# Patient Record
Sex: Male | Born: 1950 | Race: White | Hispanic: No | Marital: Married | Smoking: Former smoker
Health system: Southern US, Community
[De-identification: ages and names within clinical notes are randomized; demographics above are authoritative.]

## PROBLEM LIST (undated history)

## (undated) DIAGNOSIS — R972 Elevated prostate specific antigen [PSA]: Secondary | ICD-10-CM

## (undated) DIAGNOSIS — I447 Left bundle-branch block, unspecified: Secondary | ICD-10-CM

## (undated) DIAGNOSIS — K219 Gastro-esophageal reflux disease without esophagitis: Secondary | ICD-10-CM

## (undated) DIAGNOSIS — R011 Cardiac murmur, unspecified: Secondary | ICD-10-CM

## (undated) DIAGNOSIS — Z9889 Other specified postprocedural states: Secondary | ICD-10-CM

## (undated) DIAGNOSIS — R55 Syncope and collapse: Secondary | ICD-10-CM

## (undated) DIAGNOSIS — I10 Essential (primary) hypertension: Secondary | ICD-10-CM

## (undated) DIAGNOSIS — I428 Other cardiomyopathies: Secondary | ICD-10-CM

## (undated) DIAGNOSIS — R5383 Other fatigue: Secondary | ICD-10-CM

## (undated) DIAGNOSIS — I429 Cardiomyopathy, unspecified: Secondary | ICD-10-CM

## (undated) DIAGNOSIS — I5022 Chronic systolic (congestive) heart failure: Secondary | ICD-10-CM

## (undated) DIAGNOSIS — L570 Actinic keratosis: Secondary | ICD-10-CM

## (undated) DIAGNOSIS — R0602 Shortness of breath: Secondary | ICD-10-CM

## (undated) DIAGNOSIS — Z85828 Personal history of other malignant neoplasm of skin: Secondary | ICD-10-CM

## (undated) DIAGNOSIS — K635 Polyp of colon: Secondary | ICD-10-CM

## (undated) HISTORY — DX: Cardiac murmur, unspecified: R01.1

## (undated) HISTORY — DX: Other fatigue: R53.83

## (undated) HISTORY — DX: Chronic systolic (congestive) heart failure: I50.22

## (undated) HISTORY — DX: Personal history of other malignant neoplasm of skin: Z85.828

## (undated) HISTORY — PX: TONSILLECTOMY AND ADENOIDECTOMY: SUR1326

## (undated) HISTORY — PX: FRACTURE SURGERY: SHX138

## (undated) HISTORY — DX: Polyp of colon: K63.5

## (undated) HISTORY — PX: CARDIAC CATHETERIZATION: SHX172

## (undated) HISTORY — DX: Other cardiomyopathies: I42.8

## (undated) HISTORY — DX: Elevated prostate specific antigen (PSA): R97.20

## (undated) HISTORY — DX: Shortness of breath: R06.02

## (undated) HISTORY — DX: Actinic keratosis: L57.0

## (undated) HISTORY — PX: COLONOSCOPY: SHX174

## (undated) HISTORY — DX: Syncope and collapse: R55

## (undated) HISTORY — DX: Other specified postprocedural states: Z98.890

---

## 1979-12-21 HISTORY — PX: ORIF ELBOW FRACTURE: SHX5031

## 2000-09-15 ENCOUNTER — Encounter: Payer: Self-pay | Admitting: Emergency Medicine

## 2000-09-15 ENCOUNTER — Emergency Department (HOSPITAL_COMMUNITY): Admission: EM | Admit: 2000-09-15 | Discharge: 2000-09-15 | Payer: Self-pay | Admitting: Emergency Medicine

## 2008-05-20 HISTORY — PX: SHOULDER SURGERY: SHX246

## 2008-05-23 ENCOUNTER — Ambulatory Visit (HOSPITAL_BASED_OUTPATIENT_CLINIC_OR_DEPARTMENT_OTHER): Admission: RE | Admit: 2008-05-23 | Discharge: 2008-05-24 | Payer: Self-pay | Admitting: Orthopedic Surgery

## 2008-05-24 ENCOUNTER — Encounter (INDEPENDENT_AMBULATORY_CARE_PROVIDER_SITE_OTHER): Payer: Self-pay | Admitting: Orthopedic Surgery

## 2011-05-04 NOTE — Op Note (Signed)
NAME:  Isaiah Wu, Isaiah Wu               ACCOUNT NO.:  [id number]   MEDICAL RECORD NO.:  [id number]          PATIENT TYPE:  AMB   LOCATION:  DSC                          FACILITY:  [facility]   PHYSICIAN:  Katy Fitch. Sypher, M.D. DATE OF BIRTH:  1951/09/18   DATE OF PROCEDURE:  05/23/2008  DATE OF DISCHARGE:                               OPERATIVE REPORT   PREOPERATIVE DIAGNOSES:  Painful right shoulder with MRI evidence of  acromioclavicular joint arthropathy, a partial-thickness rotator cuff  tear, and a substantial lytic lesion in the greater tuberosity of the  proximal right humerus consistent with a possible cyst or enchondroma.   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   OPERATION:  1. Diagnostic arthroscopy, right shoulder.  2. Arthroscopic debridement of labrum.  3. Arthroscopic subacromial decompression with bursectomy,      coracoacromial ligament relaxation, and acromioplasty.  4. Arthroscopic distal clavicle resection.  5. Excisional biopsy of lytic lesion, right humerus greater      tuberosity, followed by cancellus allograft bone graft.  6. Repair of rotator cuff utilizing 2 medial Bio-Corkscrew anchors and      a McLaughlin through-bone suture with a suture bridge and over-the-      top technique to secure the bone graft at the greater tuberosity.   OPERATING SURGEON:  Katy Fitch. Sypher, MD   ASSISTANT:  Marveen Reeks Dasnoit, PA   ANESTHESIA:  General by endotracheal technique supplemented by a right  interscalene block.   SUPERVISING ANESTHESIOLOGIST:  Dr. Carman Ching.   INDICATIONS:  Isaiah Wu is a 60 year old gentleman referred  through the courtesy of Dr. Catha Gosselin of [facility] for evaluation of a painful right  shoulder.  Isaiah Wu  symptoms began in February 2009.   On clinical examination, he was noted to have signs of probable  impingement and a possible rotator cuff tear.  Plain x-rays of the  shoulder documented degenerative change at the [facility] joint and no sign of  significant glenohumeral arthritis.   We recommended imaging of the shoulder with an MRI.  The MRI was  obtained on May 02, 2008, and documented evidence of a high-grade  probable full-thickness tear of the distal supraspinatus tendon, a  circumscribed 14-mm lytic lesion in the greater tuberosity consistent  with either degenerative cyst or an enchondroma, a possible mild labral  tear, and AC joint arthritis and unfavorable acromial anatomy.   Due to fact that [name] had persistent pain, he presents at this  time anticipating diagnostic arthroscopy, labral debridement, rotator  cuff inspection followed by subacromial decompression, distal clavicle  resection, and repair of his rotator cuff.   We intend to formally biopsy his greater tuberosity lytic lesion and  place a cancellus allograft followed by rotator cuff repair to trap the  allograft and repair the cuff predicament.   After informed consent, he is brought to the operating room at this  time.   PROCEDURE IN DETAIL:  [name] was brought to the operating room  and placed in supine position upon the operating table.   Following an anesthesia consult with Dr. Krista Blue, a right interscalene  block was placed without complication.   He was brought to room #8 of the [facility], placed in supine  position on the operating table, and under Dr. Robina Ade direct  supervision followed by Dr. Edison Pace supervision, general endotracheal  anesthesia was induced.   He was carefully positioned to a beach-chair position with the aid of a  torso and head holder designed from shoulder arthroscopy.  The right  upper extremity forequarter was prepped with  DuraPrep and draped with  impervious arthroscopy drapes.   The procedure commenced with placement of the arthroscope through a  standard posterior viewing portal.   Diagnostic arthroscopy revealed a degenerative labral tear.  The deep  surface of the rotator cuff was inspected and found be fundamentally  intact except for a small area of degenerative perforation at the  anterior supraspinatus.  It appeared that Isaiah Wu might have some  cavitary changes in the supraspinatus laterally.   An anterior portal was created under direct vision followed by use of a  4.5-mm suction shaver to debride the labrum and synovitis.   The scope was then removed from the glenohumeral joint and placed in the  subacromial space.  It should be noted that the anatomy of the of biceps  tendon at the superior labrum was normal.  The biceps tendon was normal  through the rotator interval.  The subscapularis was normal and teres  minor was normal.  The glenohumeral joint had an intact anterior,  inferior, and posterior labrum, inferior recess revealed only minor  degenerative chondromalacia at the inferior head.   The subacromial space was notable for abundant synovitis.  After  synovectomy, the anatomy the coracoacromial arch was studied.  The  anterior medial acromion was prominent as was the anterior lateral  acromion.  The capsule of the [facility] joint was taken down, and the clavicle  was noted be quite degenerative.  The distal 15 mm of clavicle was  removed arthroscopically followed by leveling the acromion to a type 1  morphology.  The bursal side of the cuff was intact.   After hemostasis was achieved and the subacromial space was thoroughly  irrigated, we proceeded with open biopsy of the greater tuberosity  lesion.   Using the MRI, we measured the position of the lesion at the midpoint of  the greater tuberosity directly beneath the conjoined tendon between the  supraspinatus and infraspinatus.  A  5-cm incision was fashioned to  perform a deltoid split anterolaterally followed by bursectomy to reveal  the greater tuberosity.  The bursal side of the cuff was intact.  Using  a spinal needle, we palpated the tuberosity until the soft spot of the  lesion was identified.  A 2.5-cm area of the conjoined tendon was  elevated sharply with a scalpel followed by use of a series of angled  curettes to thoroughly debride the soft area of bone.  This appeared to  be an enchondroma.  The lesion measured 14-15 mm in diameter.  Cancellus  bone was noted and a portion of the margins of the lesion.   This did not appear to be intensely lytic and may represent an  enchondroma or cartilaginous remnants from the diaphysis.   The lesion was thoroughly curetted with a microcurette and  a medium-  sized curette until stable cancellus margins were identified.  A spinal  needle was used to repeatedly trephine to be certain that we were not  missing any areas of soft in greater tuberosity.   The specimens were placed in formalin and passed off for pathologic  evaluation.  The cavity created by biopsy was then filled with cancellus  allograft that was tamped to a dense consistency with a blunt  cylindrical tamp followed by replacement of the conjoined tendon in an  anatomic position.   A McLaughlin through-bone suture was placed with grasping technique and  placed through drill holes to essentially cover the biopsy site, not  unlike a drum head.   Two 4.75-mm Bio-Corkscrew anchors were then drilled through the  tuberosity at the articular junction and used with a suture bridge  technique followed by an over-the-top technique to a lateral swivel lock  to reinforce the repair.   There were no apparent complications.  The subacromial space was lavaged  with sterile saline followed by hemostasis.  The deltoid was repaired  with a core suture of #2 FiberWire followed by repair of the split with  simple  suture of 0 Vicryl.  The skin was repaired with subcutaneous  suture of 0 Vicryl and 2-0 Vicryl followed by intradermal 2-0 Prolene.  Mr. Thibault was placed in a sling, transferred to recovery room for  observation of his vital signs.  We anticipate admission to recovery  care center for IV prophylactic antibiotics in the form of Ancef 1 g IV  q.8 h. x3 doses and appropriate analgesics in the form of p.o. and IV  Dilaudid and possible use of PCA morphine.      Katy Fitch Sypher, M.D.  Electronically Signed     RVS/MEDQ  D:  05/23/2008  T:  05/24/2008  Job:  [id number]   cc:   Caryn Bee L. Little, M.D.

## 2011-09-16 LAB — BASIC METABOLIC PANEL
BUN: 12
CO2: 24
Calcium: 9.6
Chloride: 104
Creatinine, Ser: 0.94
GFR calc Af Amer: >60
GFR calc non Af Amer: >60
Glucose, Bld: 90
Potassium: 4.3
Sodium: 137

## 2011-09-16 LAB — POCT HEMOGLOBIN-HEMACUE: Hemoglobin: 14.9

## 2015-04-02 DIAGNOSIS — Z9289 Personal history of other medical treatment: Secondary | ICD-10-CM

## 2015-04-02 HISTORY — DX: Personal history of other medical treatment: Z92.89

## 2015-04-07 ENCOUNTER — Encounter (HOSPITAL_COMMUNITY): Payer: Self-pay | Admitting: Cardiology

## 2015-04-07 ENCOUNTER — Inpatient Hospital Stay (HOSPITAL_COMMUNITY)
Admission: AD | Admit: 2015-04-07 | Discharge: 2015-04-10 | DRG: 287 | Disposition: A | Discharge status: Home / Self Care | Payer: BLUE CROSS/BLUE SHIELD | Source: Ambulatory Visit | Attending: Cardiology | Admitting: Cardiology

## 2015-04-07 DIAGNOSIS — N182 Chronic kidney disease, stage 2 (mild): Secondary | ICD-10-CM | POA: Diagnosis present

## 2015-04-07 DIAGNOSIS — I5021 Acute systolic (congestive) heart failure: Secondary | ICD-10-CM | POA: Diagnosis not present

## 2015-04-07 DIAGNOSIS — R0602 Shortness of breath: Secondary | ICD-10-CM | POA: Diagnosis present

## 2015-04-07 DIAGNOSIS — I255 Ischemic cardiomyopathy: Secondary | ICD-10-CM | POA: Diagnosis present

## 2015-04-07 DIAGNOSIS — I10 Essential (primary) hypertension: Secondary | ICD-10-CM | POA: Diagnosis present

## 2015-04-07 DIAGNOSIS — I129 Hypertensive chronic kidney disease with stage 1 through stage 4 chronic kidney disease, or unspecified chronic kidney disease: Secondary | ICD-10-CM | POA: Diagnosis present

## 2015-04-07 DIAGNOSIS — Z87891 Personal history of nicotine dependence: Secondary | ICD-10-CM

## 2015-04-07 DIAGNOSIS — K219 Gastro-esophageal reflux disease without esophagitis: Secondary | ICD-10-CM | POA: Diagnosis present

## 2015-04-07 DIAGNOSIS — Z9289 Personal history of other medical treatment: Secondary | ICD-10-CM

## 2015-04-07 DIAGNOSIS — I5023 Acute on chronic systolic (congestive) heart failure: Secondary | ICD-10-CM | POA: Diagnosis present

## 2015-04-07 DIAGNOSIS — E663 Overweight: Secondary | ICD-10-CM | POA: Diagnosis present

## 2015-04-07 DIAGNOSIS — I447 Left bundle-branch block, unspecified: Secondary | ICD-10-CM | POA: Diagnosis present

## 2015-04-07 DIAGNOSIS — Z6827 Body mass index (BMI) 27.0-27.9, adult: Secondary | ICD-10-CM

## 2015-04-07 DIAGNOSIS — I429 Cardiomyopathy, unspecified: Secondary | ICD-10-CM

## 2015-04-07 HISTORY — DX: Gastro-esophageal reflux disease without esophagitis: K21.9

## 2015-04-07 HISTORY — DX: Cardiomyopathy, unspecified: I42.9

## 2015-04-07 HISTORY — DX: Personal history of other medical treatment: Z92.89

## 2015-04-07 HISTORY — DX: Left bundle-branch block, unspecified: I44.7

## 2015-04-07 HISTORY — DX: Essential (primary) hypertension: I10

## 2015-04-07 LAB — COMPREHENSIVE METABOLIC PANEL
ALT: 33 U/L (ref 0–53)
AST: 23 U/L (ref 0–37)
Albumin: 3.6 g/dL (ref 3.5–5.2)
Alkaline Phosphatase: 51 U/L (ref 39–117)
Anion gap: 11 (ref 5–15)
BUN: 20 mg/dL (ref 6–23)
CO2: 20 mmol/L (ref 19–32)
Calcium: 9.2 mg/dL (ref 8.4–10.5)
Chloride: 109 mmol/L (ref 96–112)
Creatinine, Ser: 1.28 mg/dL (ref 0.50–1.35)
GFR calc Af Amer: 68 mL/min — ABNORMAL LOW (ref >90)
GFR calc non Af Amer: 59 mL/min — ABNORMAL LOW (ref >90)
Glucose, Bld: 99 mg/dL (ref 70–99)
Potassium: 4.1 mmol/L (ref 3.5–5.1)
Sodium: 140 mmol/L (ref 135–145)
Total Bilirubin: 1.3 mg/dL — ABNORMAL HIGH (ref 0.3–1.2)
Total Protein: 6.2 g/dL (ref 6.0–8.3)

## 2015-04-07 LAB — CBC WITH DIFFERENTIAL/PLATELET
Basophils Absolute: 0 10*3/uL (ref 0.0–0.1)
Basophils Relative: 0 % (ref 0–1)
Eosinophils Absolute: 0.2 10*3/uL (ref 0.0–0.7)
Eosinophils Relative: 2 % (ref 0–5)
HCT: 46.9 % (ref 39.0–52.0)
Hemoglobin: 15.8 g/dL (ref 13.0–17.0)
Lymphocytes Relative: 24 % (ref 12–46)
Lymphs Abs: 1.7 10*3/uL (ref 0.7–4.0)
MCH: 31.1 pg (ref 26.0–34.0)
MCHC: 33.7 g/dL (ref 30.0–36.0)
MCV: 92.3 fL (ref 78.0–100.0)
Monocytes Absolute: 0.8 10*3/uL (ref 0.1–1.0)
Monocytes Relative: 12 % (ref 3–12)
Neutro Abs: 4.1 10*3/uL (ref 1.7–7.7)
Neutrophils Relative %: 62 % (ref 43–77)
Platelets: 277 10*3/uL (ref 150–400)
RBC: 5.08 MIL/uL (ref 4.22–5.81)
RDW: 13.9 % (ref 11.5–15.5)
WBC: 6.8 10*3/uL (ref 4.0–10.5)

## 2015-04-07 LAB — BRAIN NATRIURETIC PEPTIDE: B Natriuretic Peptide: 1763.7 pg/mL — ABNORMAL HIGH (ref 0.0–100.0)

## 2015-04-07 MED ORDER — SODIUM CHLORIDE 0.9 % IJ SOLN: 3.0000 mL | INTRAMUSCULAR | Status: DC | PRN

## 2015-04-07 MED ORDER — ENOXAPARIN SODIUM 40 MG/0.4ML ~~LOC~~ SOLN
40.0000 mg | SUBCUTANEOUS | Status: DC
  Administered 2015-04-08: 40 mg via SUBCUTANEOUS
  Filled 2015-04-07 (×3): qty 0.4

## 2015-04-07 MED ORDER — FUROSEMIDE 10 MG/ML IJ SOLN
40.0000 mg | Freq: Two times a day (BID) | INTRAMUSCULAR | Status: DC
  Administered 2015-04-07 – 2015-04-08 (×2): 40 mg via INTRAVENOUS
  Filled 2015-04-07 (×4): qty 4

## 2015-04-07 MED ORDER — ACETAMINOPHEN 325 MG PO TABS: 650.0000 mg | ORAL_TABLET | ORAL | Status: DC | PRN

## 2015-04-07 MED ORDER — LISINOPRIL 5 MG PO TABS
5.0000 mg | ORAL_TABLET | Freq: Every day | ORAL | Status: DC
  Administered 2015-04-07 – 2015-04-09 (×3): 5 mg via ORAL
  Filled 2015-04-07 (×4): qty 1

## 2015-04-07 MED ORDER — ONDANSETRON HCL 4 MG/2ML IJ SOLN: 4.0000 mg | Freq: Four times a day (QID) | INTRAMUSCULAR | Status: DC | PRN

## 2015-04-07 MED ORDER — SODIUM CHLORIDE 0.9 % IV SOLN: 250.0000 mL | INTRAVENOUS | Status: DC | PRN

## 2015-04-07 MED ORDER — SODIUM CHLORIDE 0.9 % IJ SOLN
3.0000 mL | Freq: Two times a day (BID) | INTRAMUSCULAR | Status: DC
  Administered 2015-04-07 – 2015-04-10 (×6): 3 mL via INTRAVENOUS

## 2015-04-07 NOTE — H&P (Addendum)
History and Physical   Admit date:04/07/2015 Name:  Isaiah Wu Medical record number: [id number] DOB/Age:  1951/02/05  64 y.o. male  Referring Physician:   Dr. Hulan Fess  Primary Cardiologist:  Dr. Tollie Eth   Chief complaint/reason for admission: Shortness of breath  HPI:  This very nice 64 year old male is admitted to the hospital for treatment and evaluation of acute on chronic systolic congestive heart failure. He has a prior history of mild hypertension in the past and reflux but has previously been in good health. 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 He saw his family physician last week and was found to have a mildly elevated BNP as well as cardiomegaly. Since then he has had progressive shortness of breath and has not worked since Thursday. He was seen as than early work in today and in the office was found to have an S3 gallop and had an echocardiogram done in the office that showed an ejection fraction of around 15%. He also had a left bundle branch block pattern and had paradoxical septal motion on the echocardiogram. He even had some orthopnea while lying on the echo table and I felt it was best to go ahead and admit him to the hospital for treatment of congestive heart failure.   Past Medical History  Diagnosis Date  . Cardiomyopathy 04/07/2015  . Hypertension   . LBBB (left bundle branch block)   . GERD (gastroesophageal reflux disease)      Past Surgical History  Procedure Laterality Date  . Tonsillectomy    . Shoulder surgery Left   . Orif elbow fracture      Allergies: has no allergies on file.   Medications: Multivitamin, one a day,  Benadryl 25 mg prn  Family History:  Family Status  Relation Status Death Age  . Father Deceased 38    bone cancer diabetes  . Mother Deceased 64    cva  . Sister Alive    Social History:   reports that he has quit smoking 1980.  Drinks one glass of wine at night.  No use of illicit drugs.   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   Review of Systems: Voluntary weight loss over the past few months.  some dyspepsia and GERD symptoms Other than as noted above, the remainder of the review of systems is normal  Physical Exam: VITAL SIGNS  Blood Pressure:  118/70 Sitting, Left arm, large cuff  , 114/72 Standing, Left arm and large cuff   Pulse:  86/min. Weight:  224.00 lbs. Height:  73"BMI: 29  Constitutional:  pleasant white male in no acute distress, mildly obese Skin:  warm and dry to touch, no apparent skin lesions, or masses noted. Head:  normocephalic, normal hair pattern, no masses or tenderness Eyes:  EOMS Intact, PERRLA, C and S clear, Funduscopic exam not done. ENT:  ears, nose and throat reveal no gross abnormalities.  Dentition good. Neck:  no masses, non-tender, JVD at 45 degrees 4cm Chest:  normal symmetry, clear to auscultation. Cardiac:  regular rhythm, normal S1 and  S2, S3 present, no murmur Abdomen:  abdomen soft,non-tender, no masses, no hepatospenomegaly, or aneurysm noted Peripheral Pulses:  the femoral,dorsalis pedis, and posterior tibial pulses are full and equal bilaterally with no bruits auscultated. Extremities & Back:  1+ edema, no spinal abnormalities noted., normal muscle strength and tone. Neurological:  no gross motor or sensory deficits noted, affect appropriate, oriented x3.  Labs: CBC 04/02/15   WBC 7.5,  HGB 16.4, Hct 51.4 CMP  04/02/15 Sodium 141, Potassium 5.3 BUN 26, Creatinine 1.26   BNP (last 3 results) 04/02/15  668 Thyroid 04/02/15   TSH 2.48  EKG: Sinus tachycardia with LBBB pattern  ECHO: Several diffuse hypokinesis EF 15%.   Radiology: Pending   IMPRESSIONS: 1. Acute systolic heart failure 2. Cardiomyopathy-type undetermined-the rapid onset favors either ischemic or viral as an etiology 3. Left bundle branch block 4. History of hypertension 5. Esophageal reflux 6. Overweight  PLAN: The patient will be given intravenous diuresis and will start him on an ACE inhibitor. Once heart failure symptoms under control initiate beta blocker therapy. Once his volume status is corrected he will need to have cardiac catheterization  Signed: W. Doristine Church MD [facility]  04/07/2015, 5:46 PM

## 2015-04-08 ENCOUNTER — Inpatient Hospital Stay (HOSPITAL_COMMUNITY): Payer: BLUE CROSS/BLUE SHIELD

## 2015-04-08 DIAGNOSIS — I5021 Acute systolic (congestive) heart failure: Secondary | ICD-10-CM

## 2015-04-08 LAB — BASIC METABOLIC PANEL
Anion gap: 9 (ref 5–15)
BUN: 19 mg/dL (ref 6–23)
CO2: 27 mmol/L (ref 19–32)
Calcium: 8.9 mg/dL (ref 8.4–10.5)
Chloride: 107 mmol/L (ref 96–112)
Creatinine, Ser: 1.36 mg/dL — ABNORMAL HIGH (ref 0.50–1.35)
GFR calc Af Amer: 63 mL/min — ABNORMAL LOW (ref >90)
GFR calc non Af Amer: 55 mL/min — ABNORMAL LOW (ref >90)
Glucose, Bld: 82 mg/dL (ref 70–99)
Potassium: 4.1 mmol/L (ref 3.5–5.1)
Sodium: 143 mmol/L (ref 135–145)

## 2015-04-08 MED ORDER — SODIUM CHLORIDE 0.9 % IJ SOLN: 3.0000 mL | INTRAMUSCULAR | Status: DC | PRN

## 2015-04-08 MED ORDER — DIGOXIN 125 MCG PO TABS
0.1250 mg | ORAL_TABLET | Freq: Every day | ORAL | Status: DC
Start: 2015-04-08 — End: 2015-04-10
  Administered 2015-04-08 – 2015-04-10 (×3): 0.125 mg via ORAL
  Filled 2015-04-08 (×3): qty 1

## 2015-04-08 MED ORDER — SPIRONOLACTONE 12.5 MG HALF TABLET
12.5000 mg | ORAL_TABLET | Freq: Every day | ORAL | Status: DC
  Administered 2015-04-08 – 2015-04-09 (×2): 12.5 mg via ORAL
  Filled 2015-04-08 (×3): qty 1

## 2015-04-08 MED ORDER — CARVEDILOL 3.125 MG PO TABS: 3.1250 mg | ORAL_TABLET | Freq: Two times a day (BID) | ORAL | Status: DC

## 2015-04-08 MED ORDER — SODIUM CHLORIDE 0.9 % IJ SOLN
3.0000 mL | Freq: Two times a day (BID) | INTRAMUSCULAR | Status: DC
  Administered 2015-04-09 – 2015-04-10 (×3): 3 mL via INTRAVENOUS

## 2015-04-08 MED ORDER — FUROSEMIDE 10 MG/ML IJ SOLN
40.0000 mg | Freq: Every day | INTRAMUSCULAR | Status: DC
Start: 2015-04-08 — End: 2015-04-08

## 2015-04-08 MED ORDER — SODIUM CHLORIDE 0.9 % IV SOLN
INTRAVENOUS | Status: DC
  Administered 2015-04-09: 10 mL/h via INTRAVENOUS

## 2015-04-08 MED ORDER — FUROSEMIDE 10 MG/ML IJ SOLN
40.0000 mg | Freq: Once | INTRAMUSCULAR | Status: AC
  Administered 2015-04-08: 40 mg via INTRAVENOUS

## 2015-04-08 MED ORDER — ASPIRIN 81 MG PO CHEW
81.0000 mg | CHEWABLE_TABLET | ORAL | Status: AC
  Administered 2015-04-09: 81 mg via ORAL
  Filled 2015-04-08: qty 1

## 2015-04-08 MED ORDER — SODIUM CHLORIDE 0.9 % IV SOLN: 250.0000 mL | INTRAVENOUS | Status: DC | PRN

## 2015-04-08 NOTE — Progress Notes (Signed)
Chaplain Note:   Chaplain received consult concerning AD.   Chaplain visited pt and pt's wife bedside. Pt noted that he didn't have an AD and nurse informed him that someone would bring him the paper work.   After a brief conversation, both pt and his spouse desired packets to look over and decide if they wanted to proceed. Chaplain handed ADs to pt and wife.   They will mention if they desire to move forward.   Delford Field, Chaplain 04/08/2015

## 2015-04-08 NOTE — Progress Notes (Signed)
Heart Failure Navigator Consult Note  Presentation: Isaiah Wu is a 64 year old male is admitted to the hospital for treatment and evaluation of acute on chronic systolic congestive heart failure. He has a prior history of mild hypertension in the past and reflux but has previously been in good health. 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 He saw his family physician last week and was found to have a mildly elevated BNP as well as cardiomegaly. Since then he has had progressive shortness of breath and has not worked since Thursday. He was seen as than early work in today and in the office was found to have an S3 gallop and had an echocardiogram done in the office that showed an ejection fraction of around 15%. He also had a left bundle branch block pattern and had paradoxical septal motion on the echocardiogram. He even had some orthopnea while lying on the echo table and I felt it was best to go ahead and admit him to the hospital for treatment of congestive heart failure.   Past Medical History  Diagnosis Date  . Cardiomyopathy 04/07/2015  . Hypertension   . LBBB (left bundle branch block)   . GERD (gastroesophageal reflux disease)     History   Social History  . Marital Status: Married    Spouse Name: N/A  . Number of Children: N/A  . Years of Education: N/A   Social History Main Topics  . Smoking status: Former Research scientist (life sciences)  . Smokeless tobacco: Former Systems developer  . Alcohol Use: 0.0 oz/week    0 Standard drinks or equivalent per week     Comment: one glass of wine daily   . Drug Use: No  .  Sexual Activity: Yes   Other Topics Concern  . None   Social History Narrative  . None    ECHO: EF 15% per Dr Thurman Coyer note --office echo  BNP    Component Value Date/Time   BNP 1763.7* 04/07/2015 1910    ProBNP No results found for: PROBNP   Education Assessment and Provision:  Detailed education and instructions provided on heart failure disease management including the following:  Signs and symptoms of Heart Failure When to call the physician Importance of daily weights Low sodium diet Fluid restriction Medication management Anticipated future follow-up appointments  Patient education given on each of the above topics.  Patient acknowledges understanding and acceptance of all instructions.  I spoke at length with Mr. Corales and his wife regarding his new HF diagnosis.  They are able to teach back all topics listed above.  They tell me that they typically eat a low sodium diet and plan to become even a bit more strict now.  We reviewed high sodium foods to avoid.  They have a scale and we discussed the importance of daily weights and how to use them as tool in relation to signs and symptoms of HF.  They deny any issues getting or taking prescribed medications.  He is for a cardiac catheterization tomorrow and I will plan to return to reinforce education.  They have plans  to follow outpatient in the AHF clinic.  Education Materials:  "Living Better With Heart Failure" Booklet, Daily Weight Tracker Tool and Heart Failure Educational Video.   High Risk Criteria for Readmission and/or Poor Patient Outcomes:  (Recommend Follow-up with Advanced Heart Failure Clinic)--yes   EF <30%- yes 15%  2 or more admissions in 6 months- No-New HF  Difficult social situation- No  Demonstrates medication noncompliance- No    Barriers of Care:  Knowledge as this a new diagnosis , compliance  Discharge Planning:   Plans to return home with wife

## 2015-04-08 NOTE — Progress Notes (Signed)
UR complete.  Nelli Swalley RN, MSN 

## 2015-04-08 NOTE — Progress Notes (Signed)
Subjective:  Diuresed significantly last night losing 5 pounds overnight.  Feeling better with minimal cough.  No chest tightness.  Objective:  Vital Signs in the last 24 hours: BP 112/81 mmHg  Pulse 91  Temp(Src) 97.9 F (36.6 C) (Oral)  Resp 17  Ht 6\' 1"  (1.854 m)  Wt 96.707 kg (213 lb 3.2 oz)  BMI 28.13 kg/m2  SpO2 98%  Physical Exam: Pleasant male currently in no acute distress Lungs:  Clear  Cardiac:  Regular rhythm, normal S1 and S2, soft S3 heard  Abdomen:  Soft, nontender, no masses Extremities:  Edema resolved today   Intake/Output from previous day: 04/18 0701 - 04/19 0700 In: 340 [P.O.:340] Out: 2950 [Urine:2950] Weight Filed Weights   04/07/15 1802 04/08/15 0629  Weight: 99.202 kg (218 lb 11.2 oz) 96.707 kg (213 lb 3.2 oz)    Lab Results: Basic Metabolic Panel:  Recent Labs  04/07/15 1910 04/08/15 0505  NA 140 143  K 4.1 4.1  CL 109 107  CO2 20 27  GLUCOSE 99 82  BUN 20 19  CREATININE 1.28 1.36*    CBC:  Recent Labs  04/07/15 1910  WBC 6.8  NEUTROABS 4.1  HGB 15.8  HCT 46.9  MCV 92.3  PLT 277    BNP    Component Value Date/Time   BNP 1763.7* 04/07/2015 1910   Telemetry: Sinus rhythm with bundle branch block pattern  Assessment/Plan:  1.  Acute systolic congestive heart failure 2.  Left bundle-branch block 3.  Mild worsening of renal function overnight.  His creatinine was 0.96 months ago area this may represent some element of congestive heart failure  Recommendations:  With fulminant onset of heart failure symptoms will last for heart failure team to see.  He clinically is much improved today and if renal function stable in the morning will plan cardiac catheterization tomorrow to assess for coronary artery disease as an etiology.  Since clinically improved overnight initiate low-dose beta blocker therapy today.  With prompt response initially to Lasix may cut back to once a day since clinically improved. Cardiac  catheterization procedure was discussed with the patient fully including risks of myocardial infarction, death, stroke, bleeding, arrhythmia, dye allergy, or renal insufficiency. The patient understands and is willing to proceed.   Kerry Hough  MD [facility] Card[facility]  04/08/2015, 8:25 AM

## 2015-04-08 NOTE — Progress Notes (Signed)
CARE MANAGEMENT NOTE 04/08/2015  Patient:  Isaiah Wu, Isaiah Wu   Account Number:  [id number]  Date Initiated:  04/08/2015  Documentation initiated by:  Lorne Skeens  Subjective/Objective Assessment:   Patient was admitted with shortness of breat, acute systolic heart failure. Lives at home alone.     Action/Plan:   Will folllow for discharge needs.   Anticipated DC Date:  04/12/2015   Anticipated DC Plan:  [place]  CM consult      Choice offered to / List presented to:             Status of service:   Medicare Important Message given?   (If response is "NO", the following Medicare IM given date fields will be blank) Date Medicare IM given:   Medicare IM given by:   Date Additional Medicare IM given:   Additional Medicare IM given by:    Discharge Disposition:    Per UR Regulation:  Reviewed for med. necessity/level of care/duration of stay  If discussed at [place] of Stay Meetings, dates discussed:    Comments:

## 2015-04-08 NOTE — Consult Note (Signed)
Advanced Heart Failure Team Consult Note   Primary Physician: Primary Cardiologist:  [name]   HPI:    Isaiah Wu is a 64 year old male with h/o mild HTN and GERD but no other cardiac risk factors who was admitted by Dr. [name] for acute systolic HF.   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 He saw his family physician last week and was found to have a mildly elevated BNP as well as cardiomegaly.   He was seen by Dr. [name] on 4/18 and  found to have an S3 gallop and had an echocardiogram done in the office that showed an ejection fraction of around 15%. He also had a left bundle branch block pattern and had paradoxical septal motion on the echocardiogram. He was admitted for further management. He has diuresed 5 pounds and now breathing much better. Plan for R/L heart cath tomorrow.  Denies FHx of cardiomyopathy. Drinks a glass or two of wine per night. No drug use Denies sleep apnea or heavy snoring.   Review of Systems: [y] = yes, [ ]  = no   General: Weight gain [ ] ; Weight loss [ ] ; Anorexia [ ] ; Fatigue Blue.Reese ]; Fever [ ] ; Chills [ ] ; Weakness [ ]   Cardiac: Chest pain/pressure [ ] ; Resting SOB Blue.Reese ]; Exertional SOB Blue.Reese ]; Orthopnea Blue.Reese ]; Pedal Edema [ ] ; Palpitations [ ] ; Syncope [ ] ; Presyncope [ ] ; Paroxysmal nocturnal dyspnea[ ]   Pulmonary: Cough Blue.Reese ]; Wheezing[ ] ; Hemoptysis[ ] ; Sputum [ ] ; Snoring [ ]   GI: Vomiting[ ] ; Dysphagia[ ] ; Melena[ ] ; Hematochezia [ ] ; Heartburn[ ] ; Abdominal pain [ ] ; Constipation [ ] ; Diarrhea [ ] ; BRBPR [ ]   GU: Hematuria[ ] ; Dysuria [ ] ; Nocturia[ ]   Vascular: Pain in legs with  walking [ ] ; Pain in feet with lying flat [ ] ; Non-healing sores [ ] ; Stroke [ ] ; TIA [ ] ; Slurred speech [ ] ;  Neuro: Headaches[ ] ; Vertigo[ ] ; Seizures[ ] ; Paresthesias[ ] ;Blurred vision [ ] ; Diplopia [ ] ; Vision changes [ ]   Ortho/Skin: Arthritis [ ] ; Joint pain [ ] ; Muscle pain [ ] ; Joint swelling [ ] ; Back Pain [ ] ; Rash [ ]   Psych: Depression[ ] ; Anxiety[ ]   Heme: Bleeding problems [ ] ; Clotting disorders [ ] ; Anemia [ ]   Endocrine: Diabetes [ ] ; Thyroid dysfunction[ ]   Home Medications Prior to Admission medications   Not on File    Past Medical History: Past Medical History  Diagnosis Date  . Cardiomyopathy 04/07/2015  . Hypertension   . LBBB (left bundle branch block)   . GERD (gastroesophageal reflux disease)     Past Surgical History: Past Surgical History  Procedure Laterality Date  . Tonsillectomy    . Shoulder surgery Left   . Orif elbow fracture      Family History: History reviewed. No pertinent family history.  Social History: History   Social History  . Marital Status: Married    Spouse Name: N/A  . Number of Children: N/A  . Years of Education: N/A   Social History Main Topics  . Smoking status: Former Research scientist (life sciences)  .  Smokeless tobacco: Former Systems developer  . Alcohol Use: 0.0 oz/week    0 Standard drinks or equivalent per week     Comment: one glass of wine daily   . Drug Use: No  . Sexual Activity: Yes   Other Topics Concern  . None   Social History Narrative  . None    Allergies:  No Known Allergies  Objective:    Vital Signs:   Temp:  [97.7 F (36.5 C)-98 F (36.7 C)] 97.9 F (36.6 C) (04/19 0629) Pulse Rate:  [91-110] 100 (04/19 1203) Resp:  [16-18] 17 (04/19 0629) BP: (105-126)/(74-99) 105/78 mmHg (04/19 1032) SpO2:  [95 %-98 %] 95 % (04/19 1032) Weight:  [213 lb 3.2 oz (96.707 kg)-218 lb 11.2 oz (99.202 kg)] 213 lb 3.2 oz (96.707 kg) (04/19 0629) Last BM Date: 04/07/15 Filed Weights   04/07/15 1802 04/08/15 0629  Weight: 218 lb 11.2  oz (99.202 kg) 213 lb 3.2 oz (96.707 kg)    Physical Exam: General:  Well appearing. No resp difficulty HEENT: normal Neck: supple. JVP 6-7 . Carotids 2+ bilat; no bruits. No lymphadenopathy or thryomegaly appreciated. Cor: PMI laterally displaced. Regular rate & rhythm. Wide split s2. + s3 Lungs: clear Abdomen: soft, nontender, nondistended. No hepatosplenomegaly. No bruits or masses. Good bowel sounds. Extremities: no cyanosis, clubbing, rash, edema Neuro: alert & orientedx3, cranial nerves grossly intact. moves all 4 extremities w/o difficulty. Affect pleasant  Telemetry: SR/Sinus tach  Labs: Basic Metabolic Panel:  Recent Labs Lab 04/07/15 1910 04/08/15 0505  NA 140 143  K 4.1 4.1  CL 109 107  CO2 20 27  GLUCOSE 99 82  BUN 20 19  CREATININE 1.28 1.36*  CALCIUM 9.2 8.9    Liver Function Tests:  Recent Labs Lab 04/07/15 1910  AST 23  ALT 33  ALKPHOS 51  BILITOT 1.3*  PROT 6.2  ALBUMIN 3.6   No results for input(s): LIPASE, AMYLASE in the last 168 hours. No results for input(s): AMMONIA in the last 168 hours.  CBC:  Recent Labs Lab 04/07/15 1910  WBC 6.8  NEUTROABS 4.1  HGB 15.8  HCT 46.9  MCV 92.3  PLT 277    Cardiac Enzymes: No results for input(s): CKTOTAL, CKMB, CKMBINDEX, TROPONINI in the last 168 hours.  BNP: BNP (last 3 results)  Recent Labs  04/07/15 1910  BNP 1763.7*    ProBNP (last 3 results) No results for input(s): PROBNP in the last 8760 hours.   CBG: No results for input(s): GLUCAP in the last 168 hours.  Coagulation Studies: No results for input(s): LABPROT, INR in the last 72 hours.  Other results: EKG: Sinus tach 101 LBBB  PVCs  Imaging: Dg Chest 2 View  04/08/2015   CLINICAL DATA:  64 year old male with AA systolic congestive heart failure. Left bundle branch block. Cardiomyopathy. Initial encounter.  EXAM: CHEST  2 VIEW  COMPARISON:  None.  FINDINGS: Mild to moderate cardiomegaly. Other mediastinal contours are  within normal limits. Visualized tracheal air column is within normal limits. Evidence of small or trace bilateral pleural effusions. Pulmonary vascularity is within normal limits, no acute edema. No pneumothorax or consolidation. No acute osseous abnormality identified.  IMPRESSION: Small or trace bilateral pleural effusions without acute edema. Mild to moderate cardiomegaly.   Electronically Signed   By: Genevie Ann M.D.   On: 04/08/2015 08:00       Assessment:   1. Acute systolic HF   --EF 91% 2. Mild HTN 3. LBBB  Plan/Discussion:  I agree with Dr. [name]. Likely NICM but no clear trigger. He is much improved with diuresis. Would give one more dose of IV lasix. Agree with plans for R/L cath tomorrow. If no significant CAD will need cMRI in near future.   Continue lisinopril. Add digoxin and spiro. Likely start low-dose carvedilol tomorrow.   Will check routine screening labs including: SPEP/UPEP, tsh, iron stores, hepatitis panels, HIV.   HF team will follow and help manage. I had a long HF discussion with him and his wife and have also asked our HF Navigator to come by and educate as well.    Length of Stay: 1   Glori Bickers MD 04/08/2015, 12:52 PM  Advanced Heart Failure Team Pager [phone] (M-F; 7a - 4p)  Please contact [facility] for night-coverage after hours (4p -7a ) and weekends on [email or web address]

## 2015-04-09 ENCOUNTER — Encounter (HOSPITAL_COMMUNITY)
Admission: AD | Disposition: A | Discharge status: Home / Self Care | Payer: Self-pay | Source: Ambulatory Visit | Attending: Cardiology

## 2015-04-09 ENCOUNTER — Encounter (HOSPITAL_COMMUNITY): Payer: Self-pay | Admitting: Cardiology

## 2015-04-09 HISTORY — PX: LEFT AND RIGHT HEART CATHETERIZATION WITH CORONARY ANGIOGRAM: SHX5449

## 2015-04-09 LAB — POCT I-STAT 3, VENOUS BLOOD GAS (G3P V)
Acid-base deficit: 1 mmol/L (ref 0.0–2.0)
Bicarbonate: 24.1 mEq/L — ABNORMAL HIGH (ref 20.0–24.0)
O2 Saturation: 60 %
TCO2: 25 mmol/L (ref 0–100)
pCO2, Ven: 40.3 mmHg — ABNORMAL LOW (ref 45.0–50.0)
pH, Ven: 7.384 — ABNORMAL HIGH (ref 7.250–7.300)
pO2, Ven: 32 mmHg (ref 30.0–45.0)

## 2015-04-09 LAB — CBC
HCT: 46.9 % (ref 39.0–52.0)
Hemoglobin: 15.6 g/dL (ref 13.0–17.0)
MCH: 30.5 pg (ref 26.0–34.0)
MCHC: 33.3 g/dL (ref 30.0–36.0)
MCV: 91.8 fL (ref 78.0–100.0)
Platelets: 258 10*3/uL (ref 150–400)
RBC: 5.11 MIL/uL (ref 4.22–5.81)
RDW: 13.7 % (ref 11.5–15.5)
WBC: 5.4 10*3/uL (ref 4.0–10.5)

## 2015-04-09 LAB — BASIC METABOLIC PANEL
Anion gap: 11 (ref 5–15)
BUN: 20 mg/dL (ref 6–23)
CO2: 29 mmol/L (ref 19–32)
Calcium: 9.4 mg/dL (ref 8.4–10.5)
Chloride: 104 mmol/L (ref 96–112)
Creatinine, Ser: 1.4 mg/dL — ABNORMAL HIGH (ref 0.50–1.35)
GFR calc Af Amer: 61 mL/min — ABNORMAL LOW (ref >90)
GFR calc non Af Amer: 53 mL/min — ABNORMAL LOW (ref >90)
Glucose, Bld: 73 mg/dL (ref 70–99)
Potassium: 3.7 mmol/L (ref 3.5–5.1)
Sodium: 144 mmol/L (ref 135–145)

## 2015-04-09 LAB — HEPATITIS PANEL, ACUTE
HCV Ab: NEGATIVE
Hep A IgM: NONREACTIVE
Hep B C IgM: NONREACTIVE
Hepatitis B Surface Ag: NEGATIVE

## 2015-04-09 LAB — PROTIME-INR
INR: 1.26 (ref 0.00–1.49)
Prothrombin Time: 15.9 seconds — ABNORMAL HIGH (ref 11.6–15.2)

## 2015-04-09 LAB — POCT I-STAT 3, ART BLOOD GAS (G3+)
Acid-base deficit: 1 mmol/L (ref 0.0–2.0)
Bicarbonate: 23.3 mEq/L (ref 20.0–24.0)
O2 Saturation: 95 %
TCO2: 24 mmol/L (ref 0–100)
pCO2 arterial: 36 mmHg (ref 35.0–45.0)
pH, Arterial: 7.418 (ref 7.350–7.450)
pO2, Arterial: 74 mmHg — ABNORMAL LOW (ref 80.0–100.0)

## 2015-04-09 LAB — TSH: TSH: 1.371 u[IU]/mL (ref 0.350–4.500)

## 2015-04-09 LAB — IRON AND TIBC
Iron: 42 ug/dL (ref 42–165)
Saturation Ratios: 14 % — ABNORMAL LOW (ref 20–55)
TIBC: 290 ug/dL (ref 215–435)
UIBC: 248 ug/dL (ref 125–400)

## 2015-04-09 LAB — FERRITIN: Ferritin: 112 ng/mL (ref 22–322)

## 2015-04-09 LAB — CREATININE, SERUM
Creatinine, Ser: 1.23 mg/dL (ref 0.50–1.35)
GFR calc Af Amer: 72 mL/min — ABNORMAL LOW (ref >90)
GFR calc non Af Amer: 62 mL/min — ABNORMAL LOW (ref >90)

## 2015-04-09 LAB — HIV ANTIBODY (ROUTINE TESTING W REFLEX): HIV Screen 4th Generation wRfx: NONREACTIVE

## 2015-04-09 SURGERY — LEFT AND RIGHT HEART CATHETERIZATION WITH CORONARY ANGIOGRAM
Anesthesia: LOCAL

## 2015-04-09 MED ORDER — HEPARIN (PORCINE) IN NACL 2-0.9 UNIT/ML-% IJ SOLN
INTRAMUSCULAR | Status: AC
  Filled 2015-04-09: qty 1000

## 2015-04-09 MED ORDER — LIDOCAINE HCL (PF) 1 % IJ SOLN
INTRAMUSCULAR | Status: AC
  Filled 2015-04-09: qty 30

## 2015-04-09 MED ORDER — SODIUM CHLORIDE 0.9 % IV SOLN: INTRAVENOUS | Status: DC

## 2015-04-09 NOTE — CV Procedure (Signed)
CARDIAC CATHETERIZATION REPORT   Isaiah Wu      64 y.o.  male   DOB: 03-06-1951   MRN: [id number]  Today's Date: 04/09/2015   PROCEDURE:  Right and left heart catheterization with selective coronary angiography, left ventriculogram.  INDICATIONS:  New-onset of cardiomyopathy and systolic congestive heart failure  The risks, benefits, and details of the procedure were explained to the patient.  The patient verbalized understanding and wanted to proceed.  Informed written consent was obtained.  PROCEDURE TECHNIQUE:   After Xylocaine anesthesia a 61F sheath was placed in the right femoral vein. Right heart pressures were measured with a Swan-Ganz catheter, pulmonary artery saturation was measured, and thermodilution cardiac outputs were done.  A 62F sheath was then  placed in the right femoral artery with a single anterior needle wall stick.   Left coronary angiography was done using a Judkins L4 guide catheter.  Right coronary angiography was done using a Judkins R4 guide catheter.  The aortic valve was crossed with a pigtail catheter and pressures were measured but it ventriculogram was not performed due to the increased LVEDP.  The sheath was removed in the holding area.  The patient tolerated the procedure well.   CONTRAST:  Total of  30 cc.  ESTIMATED BLOOD LOSS:  Minimal   COMPLICATIONS:  None.    HEMODYNAMICS:   Right atrium:           A= 16    V=11  Mean = 10 Right Ventricle:       58/8-17 Pulmonary Artery:   58/35   Mean = 48               Sat=60% PCWP:                   A=33, V=30  Mean = 32 Aorta                       110/82               Sat=   95%        LV                           110/17-32             There was no gradient between the left ventricle and aorta.    ANGIOGRAPHIC DATA:    CORONARY ARTERIES:   Arise and distribute normally.  Right dominant. No coronary calcification is noted.  Left main coronary artery: Normal.  Left  anterior descending: Normal.  Circumflex coronary artery: Normal.  Right coronary artery: Normal.  LEFT VENTRICULOGRAM:  not performed.   IMPRESSIONS:  1. Normal  coronary arteries 2. Moderate pulmonary hypertension with increased LVEDP and increased left atrial pressure  RECOMMENDATION:  Intensive treatment for congestive heart failure  W. Tollie Eth, Brooke Bonito. MD [facility]

## 2015-04-09 NOTE — Interval H&P Note (Signed)
History and Physical Interval Note:  04/09/2015 12:54 PM  Isaiah Wu  has presented today for surgery, with the diagnosis of cp  The various methods of treatment have been discussed with the patient and family. After consideration of risks, benefits and other options for treatment, the patient has consented to  Procedure(s): LEFT AND RIGHT HEART CATHETERIZATION WITH CORONARY ANGIOGRAM (N/A) as a surgical intervention .  The patient's history has been reviewed, patient examined, no change in status, stable for surgery.  I have reviewed the patient's chart and labs.  Questions were answered to the patient's satisfaction.     TILLEY JR,W SPENCER

## 2015-04-09 NOTE — H&P (View-Only) (Signed)
Subjective:  Continues to improve.  Heart failure team note appreciated.  Slept well last night with minimal cough.  No shortness of breath.  Further diuresis overnight..  Weight down another 6 pounds.  Objective:  Vital Signs in the last 24 hours: BP 116/85 mmHg  Pulse 98  Temp(Src) 97.4 F (36.3 C) (Oral)  Resp 18  Ht 6\' 1"  (1.854 m)  Wt 93.668 kg (206 lb 8 oz)  BMI 27.25 kg/m2  SpO2 97%  Physical Exam: Pleasant male currently in no acute distress Lungs:  Clear  Cardiac:  Regular rhythm, normal S1 and S2, soft S3 heard  Abdomen:  Soft, nontender, no masses Extremities:  No edema  Intake/Output from previous day: 04/19 0701 - 04/20 0700 In: 1413 [P.O.:1413] Out: 4450 [Urine:4450] Weight Filed Weights   04/08/15 0629 04/08/15 1902 04/09/15 0427  Weight: 96.707 kg (213 lb 3.2 oz) 96.707 kg (213 lb 3.2 oz) 93.668 kg (206 lb 8 oz)    Lab Results: Basic Metabolic Panel:  Recent Labs  04/08/15 0505 04/09/15 0326  NA 143 144  K 4.1 3.7  CL 107 104  CO2 27 29  GLUCOSE 82 73  BUN 19 20  CREATININE 1.36* 1.40*    CBC:  Recent Labs  04/07/15 1910  WBC 6.8  NEUTROABS 4.1  HGB 15.8  HCT 46.9  MCV 92.3  PLT 277    BNP    Component Value Date/Time   BNP 1763.7* 04/07/2015 1910   Telemetry: Sinus rhythm with bundle branch block pattern  Assessment/Plan:  1.  Acute systolic congestive heart failure-clinically improved 2.  Left bundle-branch block 3.  Mild worsening of renal function overnight.  His creatinine was 0.96 months ago area this may represent some element of congestive heart failure  Recommendations:  Right and left heart catheterization later today.  Following that likely will initiate beta blocker therapy and spironolactone.  We'll cut back diuresis with rise in creatinine and improvement of symptoms.   Kerry Hough  MD [facility]  04/09/2015, 8:50 AM

## 2015-04-09 NOTE — Progress Notes (Signed)
Subjective:  Continues to improve.  Heart failure team note appreciated.  Slept well last night with minimal cough.  No shortness of breath.  Further diuresis overnight..  Weight down another 6 pounds.  Objective:  Vital Signs in the last 24 hours: BP 116/85 mmHg  Pulse 98  Temp(Src) 97.4 F (36.3 C) (Oral)  Resp 18  Ht 6\' 1"  (1.854 m)  Wt 93.668 kg (206 lb 8 oz)  BMI 27.25 kg/m2  SpO2 97%  Physical Exam: Pleasant male currently in no acute distress Lungs:  Clear  Cardiac:  Regular rhythm, normal S1 and S2, soft S3 heard  Abdomen:  Soft, nontender, no masses Extremities:  No edema  Intake/Output from previous day: 04/19 0701 - 04/20 0700 In: 1413 [P.O.:1413] Out: 4450 [Urine:4450] Weight Filed Weights   04/08/15 0629 04/08/15 1902 04/09/15 0427  Weight: 96.707 kg (213 lb 3.2 oz) 96.707 kg (213 lb 3.2 oz) 93.668 kg (206 lb 8 oz)    Lab Results: Basic Metabolic Panel:  Recent Labs  04/08/15 0505 04/09/15 0326  NA 143 144  K 4.1 3.7  CL 107 104  CO2 27 29  GLUCOSE 82 73  BUN 19 20  CREATININE 1.36* 1.40*    CBC:  Recent Labs  04/07/15 1910  WBC 6.8  NEUTROABS 4.1  HGB 15.8  HCT 46.9  MCV 92.3  PLT 277    BNP    Component Value Date/Time   BNP 1763.7* 04/07/2015 1910   Telemetry: Sinus rhythm with bundle branch block pattern  Assessment/Plan:  1.  Acute systolic congestive heart failure-clinically improved 2.  Left bundle-branch block 3.  Mild worsening of renal function overnight.  His creatinine was 0.96 months ago area this may represent some element of congestive heart failure  Recommendations:  Right and left heart catheterization later today.  Following that likely will initiate beta blocker therapy and spironolactone.  We'll cut back diuresis with rise in creatinine and improvement of symptoms.   Kerry Hough  MD [facility]  04/09/2015, 8:50 AM

## 2015-04-09 NOTE — Progress Notes (Signed)
Site area: RFA/RFV Site Prior to Removal:  Level 0 Pressure Applied For:20 min Manual:   yes Patient Status During Pull: stable  Post Pull Site:  Level 0 Post Pull Instructions Given:  yes Post Pull Pulses Present: palpable Dressing Applied:  clear Bedrest begins @ 1410 Comments:

## 2015-04-09 NOTE — Progress Notes (Signed)
Advanced Heart Failure Rounding Note   Subjective:     Breathing better. Weight down 12 pounds total. Creatinine up slightly. Lasix held. For cath today.    Objective:   Weight Range:  Vital Signs:   Temp:  [97.4 F (36.3 C)-97.7 F (36.5 C)] 97.4 F (36.3 C) (04/20 0427) Pulse Rate:  [95-100] 98 (04/20 0427) Resp:  [18] 18 (04/20 0427) BP: (105-116)/(64-85) 116/85 mmHg (04/20 0427) SpO2:  [95 %-99 %] 97 % (04/20 0427) Weight:  [93.668 kg (206 lb 8 oz)-96.707 kg (213 lb 3.2 oz)] 93.668 kg (206 lb 8 oz) (04/20 0427) Last BM Date: 04/08/15  Weight change: Filed Weights   04/08/15 0629 04/08/15 1902 04/09/15 0427  Weight: 96.707 kg (213 lb 3.2 oz) 96.707 kg (213 lb 3.2 oz) 93.668 kg (206 lb 8 oz)    Intake/Output:   Intake/Output Summary (Last 24 hours) at 04/09/15 1008 Last data filed at 04/09/15 0819  Gross per 24 hour  Intake    713 ml  Output   3250 ml  Net  -2537 ml     Physical Exam: General: Well appearing. No resp difficulty HEENT: normal Neck: supple. JVP 6-7 . Carotids 2+ bilat; no bruits. No lymphadenopathy or thryomegaly appreciated. Cor: PMI laterally displaced. Regular rate & rhythm. Wide split s2. + s3 Lungs: clear Abdomen: soft, nontender, nondistended. No hepatosplenomegaly. No bruits or masses. Good bowel sounds. Extremities: no cyanosis, clubbing, rash, edema Neuro: alert & orientedx3, cranial nerves grossly intact. moves all 4 extremities w/o difficulty. Affect pleasant  Telemetry: SR 90s.   Labs: Basic Metabolic Panel:  Recent Labs Lab 04/07/15 1910 04/08/15 0505 04/09/15 0326  NA 140 143 144  K 4.1 4.1 3.7  CL 109 107 104  CO2 20 27 29   GLUCOSE 99 82 73  BUN 20 19 20   CREATININE 1.28 1.36* 1.40*  CALCIUM 9.2 8.9 9.4    Liver Function Tests:  Recent Labs Lab 04/07/15 1910  AST 23  ALT 33  ALKPHOS 51  BILITOT 1.3*  PROT 6.2  ALBUMIN 3.6   No results for input(s): LIPASE, AMYLASE in the last 168 hours. No results for  input(s): AMMONIA in the last 168 hours.  CBC:  Recent Labs Lab 04/07/15 1910  WBC 6.8  NEUTROABS 4.1  HGB 15.8  HCT 46.9  MCV 92.3  PLT 277    Cardiac Enzymes: No results for input(s): CKTOTAL, CKMB, CKMBINDEX, TROPONINI in the last 168 hours.  BNP: BNP (last 3 results)  Recent Labs  04/07/15 1910  BNP 1763.7*    ProBNP (last 3 results) No results for input(s): PROBNP in the last 8760 hours.    Other results:  Imaging: Dg Chest 2 View  04/08/2015   CLINICAL DATA:  64 year old male with AA systolic congestive heart failure. Left bundle branch block. Cardiomyopathy. Initial encounter.  EXAM: CHEST  2 VIEW  COMPARISON:  None.  FINDINGS: Mild to moderate cardiomegaly. Other mediastinal contours are within normal limits. Visualized tracheal air column is within normal limits. Evidence of small or trace bilateral pleural effusions. Pulmonary vascularity is within normal limits, no acute edema. No pneumothorax or consolidation. No acute osseous abnormality identified.  IMPRESSION: Small or trace bilateral pleural effusions without acute edema. Mild to moderate cardiomegaly.   Electronically Signed   By: Genevie Ann M.D.   On: 04/08/2015 08:00      Medications:     Scheduled Medications: . digoxin  0.125 mg Oral Daily  . enoxaparin (LOVENOX) injection  40  mg Subcutaneous Q24H  . lisinopril  5 mg Oral Daily  . sodium chloride  3 mL Intravenous Q12H  . sodium chloride  3 mL Intravenous Q12H  . spironolactone  12.5 mg Oral Daily     Infusions: . sodium chloride 10 mL/hr (04/09/15 0611)     PRN Medications:  sodium chloride, sodium chloride, acetaminophen, ondansetron (ZOFRAN) IV, sodium chloride, sodium chloride   Assessment:   1. Acute systolic HF  --EF 60% 2. Mild HTN 3. LBBB  Plan/Discussion:    Feels better. Volume status improved. Creatinine up minimally. Agree with holding lasix.   R/L cath today. Hopefully can start b-blocker post cath.    Serologies negative so far.    Length of Stay: 2   Glori Bickers MD 04/09/2015, 10:08 AM  Advanced Heart Failure Team Pager [phone] (M-F; 7a - 4p)  Please contact [facility] for night-coverage after hours (4p -7a ) and weekends on [email or web address]

## 2015-04-09 NOTE — Progress Notes (Signed)
1820 Pt off bedrest. Pt sitting at end of bed eating dinner. R femoral site remains level 0. Pt has no complaints at this time. Will continue to monitor.

## 2015-04-10 LAB — BASIC METABOLIC PANEL
Anion gap: 10 (ref 5–15)
BUN: 20 mg/dL (ref 6–23)
CO2: 26 mmol/L (ref 19–32)
Calcium: 9.3 mg/dL (ref 8.4–10.5)
Chloride: 107 mmol/L (ref 96–112)
Creatinine, Ser: 1.22 mg/dL (ref 0.50–1.35)
GFR calc Af Amer: 72 mL/min — ABNORMAL LOW (ref >90)
GFR calc non Af Amer: 62 mL/min — ABNORMAL LOW (ref >90)
Glucose, Bld: 87 mg/dL (ref 70–99)
Potassium: 4.6 mmol/L (ref 3.5–5.1)
Sodium: 143 mmol/L (ref 135–145)

## 2015-04-10 LAB — PROTEIN ELECTROPHORESIS, SERUM
A/G Ratio: 1.1 (ref 0.7–2.0)
Albumin ELP: 3.1 g/dL — ABNORMAL LOW (ref 3.2–5.6)
Alpha-1-Globulin: 0.3 g/dL (ref 0.1–0.4)
Alpha-2-Globulin: 0.7 g/dL (ref 0.4–1.2)
Beta Globulin: 1.1 g/dL (ref 0.6–1.3)
Gamma Globulin: 0.7 g/dL (ref 0.5–1.6)
Globulin, Total: 2.8 g/dL (ref 2.0–4.5)
Total Protein ELP: 5.9 g/dL — ABNORMAL LOW (ref 6.0–8.5)

## 2015-04-10 MED ORDER — FUROSEMIDE 10 MG/ML IJ SOLN
40.0000 mg | Freq: Once | INTRAMUSCULAR | Status: AC
  Administered 2015-04-10: 40 mg via INTRAVENOUS
  Filled 2015-04-10: qty 4

## 2015-04-10 MED ORDER — SPIRONOLACTONE 25 MG PO TABS
25.0000 mg | ORAL_TABLET | Freq: Every day | ORAL | Status: DC
Start: 2015-04-10 — End: 2015-04-10
  Administered 2015-04-10: 25 mg via ORAL
  Filled 2015-04-10: qty 1

## 2015-04-10 MED ORDER — SPIRONOLACTONE 25 MG PO TABS: 25.0000 mg | ORAL_TABLET | Freq: Every day | ORAL | Status: DC

## 2015-04-10 MED ORDER — DIGOXIN 125 MCG PO TABS: 0.1250 mg | ORAL_TABLET | Freq: Every day | ORAL | Status: DC

## 2015-04-10 MED ORDER — LISINOPRIL 10 MG PO TABS
10.0000 mg | ORAL_TABLET | Freq: Every day | ORAL | Status: DC
  Administered 2015-04-10: 10 mg via ORAL
  Filled 2015-04-10: qty 1

## 2015-04-10 MED ORDER — FUROSEMIDE 40 MG PO TABS: 40.0000 mg | ORAL_TABLET | Freq: Every day | ORAL | Status: DC

## 2015-04-10 MED ORDER — LISINOPRIL 10 MG PO TABS: 10.0000 mg | ORAL_TABLET | Freq: Every day | ORAL | Status: DC

## 2015-04-10 MED ORDER — FUROSEMIDE 40 MG PO TABS
40.0000 mg | ORAL_TABLET | Freq: Every day | ORAL | Status: DC
  Administered 2015-04-10: 40 mg via ORAL
  Filled 2015-04-10: qty 1

## 2015-04-10 NOTE — Discharge Summary (Signed)
Physician Discharge Summary  Patient ID: Isaiah Wu MRN: [id number] DOB/AGE: 07/13/1951 64 y.o.  Admit date: 04/07/2015 Discharge date: 04/10/2015  Primary Physician:  Dr. Lennette Bihari little   Primary Discharge Diagnosis:  1.  Acute systolic congestive heart failure  Secondary Discharge Diagnosis: 2.  Nonischemic cardiomyopathy 3.  Left bundle branch block 4.  Stage II chronic kidney disease  Procedures:  Cardiac catheterization  Consults:  Dr. Roe Coombs Course: This very nice 64 year old male was brought into the hospital for treatment and evaluation of acute on chronic systolic congestive heart failure. He has a prior history of mild hypertension in the past and reflux but has previously been in good health. 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 He saw his family physician last week and was found to have a mildly elevated BNP as well as cardiomegaly. Since then he has had progressive shortness of breath and has not worked since Thursday. He was seen as an early work in the day of admission n the office was found to have an S3 gallop and had an echocardiogram done in the office that showed an ejection fraction of around 15%. He also had a left bundle branch block pattern and had paradoxical septal motion on the echocardiogram. He even had some orthopnea while lying on the echo table and I felt it was best to go ahead and admit him to the hospital for treatment of congestive heart failure.  The patient was admitted and underwent x-ray and lab work.  He was diuresed with Lasix and  diuresed a total of 12 pounds while he was in the hospital.  His breathing improved significantly.  He was seen in consultation by Dr. Haroldine Laws who ordered serology on him.  After an adequate period of time with diuresis he underwent cardiac catheterization on 4/20.  He had normal coronary arteries but had moderate pulmonary hypertension with a PA sat of 60% as well as increased TSVR.  He was initiated on treatment with lisinopril, spironolactone and low-dose digoxin.  He was later switched to oral diuretics.  His breathing markedly improved and his discharge weight was 206 pounds.  Dr. Haroldine Laws initially felt that we should hold off on initiation of beta blockers until his afterload was reduced some.  He received an additional dose of intravenous Lasix prior to discharge.  Discharge Exam: Blood pressure 106/75, pulse 94, temperature 98.3 F (36.8 C), temperature source Oral, resp. rate 18, height 6\' 1"  (1.854 m), weight 93.5 kg (206 lb 2.1 oz), SpO2 98 %. Weight: 93.5 kg (206 lb 2.1 oz) Catheterization site clean and dry, cardiac exam normal S1 and S2, soft S3, lungs clear, no edema  Labs: CBC:   Lab Results  Component Value Date   WBC 5.4 04/09/2015   HGB 15.6 04/09/2015   HCT 46.9 04/09/2015   MCV 91.8 04/09/2015   PLT 258 04/09/2015    CMP:  Recent Labs Lab 04/07/15 1910  04/10/15 0526  NA 140  < > 143  K 4.1  < > 4.6  CL 109  < > 107  CO2  20  < > 26  BUN 20  < > 20  CREATININE 1.28  < > 1.22  CALCIUM 9.2  < > 9.3  PROT 6.2  --   --   BILITOT 1.3*  --   --   ALKPHOS 51  --   --   ALT 33  --   --   AST 23  --   --   GLUCOSE 99  < > 87  < > = values in this interval not displayed.  BNP (last 3 results)  Recent Labs  04/07/15 1910  BNP 1763.7*   Thyroid: Lab Results  Component Value Date   TSH 1.371 04/09/2015    Radiology: Mild to moderate cardiomegaly, trace pleural effusion  EKG: Sinus rhythm with left bundle branch block  Discharge Medications:    Medication List    TAKE these medications        digoxin 0.125 MG tablet  Commonly known as:  LANOXIN  Take 1 tablet (0.125 mg total) by mouth daily.     furosemide 40 MG tablet  Commonly known as:  LASIX  Take 1 tablet (40 mg total) by mouth daily.     lisinopril 10 MG tablet  Commonly known as:  PRINIVIL,ZESTRIL  Take 1 tablet (10 mg total) by mouth daily.     spironolactone 25 MG tablet  Commonly known as:  ALDACTONE  Take 1 tablet (25 mg total) by mouth daily.       Followup plans and appointments: Follow-up with advanced heart failure clinic within one week.  Follow-up with Dr. [name] in 2 weeks.  Time spent with patient to include physician time:  45 minutes  Signed: W. Doristine Church. MD [facility] 04/10/2015, 9:14 AM

## 2015-04-10 NOTE — Progress Notes (Signed)
Pt has orders to be discharged. Discharge instructions given and pt has no additional questions at this time. Medication regimen reviewed and pt educated. Pt verbalized understanding and has no additional questions. Telemetry box removed. IV removed and site in good condition. Pt stable and waiting for transportation.   Jayen Bromwell RN 

## 2015-04-10 NOTE — Progress Notes (Signed)
Advanced Heart Failure Rounding Note   Subjective:     Cath yesterday with normal coronaries. LVEDP still up with mean 32.  CO marginal. PA sat 60%  Feels better. Anxious to go home. Spiro and lisinopril increased today.    Objective:   Weight Range:  Vital Signs:   Temp:  [98.3 F (36.8 C)-98.8 F (37.1 C)] 98.3 F (36.8 C) (04/21 0529) Pulse Rate:  [81-108] 105 (04/21 0939) Resp:  [15-24] 18 (04/21 0939) BP: (99-144)/(54-104) 113/81 mmHg (04/21 0939) SpO2:  [97 %-98 %] 97 % (04/21 0939) Weight:  [206 lb 2.1 oz (93.5 kg)] 206 lb 2.1 oz (93.5 kg) (04/21 0529) Last BM Date: 04/08/15  Weight change: Filed Weights   04/08/15 1902 04/09/15 0427 04/10/15 0529  Weight: 213 lb 3.2 oz (96.707 kg) 206 lb 8 oz (93.668 kg) 206 lb 2.1 oz (93.5 kg)    Intake/Output:   Intake/Output Summary (Last 24 hours) at 04/10/15 1319 Last data filed at 04/10/15 0939  Gross per 24 hour  Intake 1273.67 ml  Output    425 ml  Net 848.67 ml     Physical Exam: General: Well appearing. No resp difficulty HEENT: normal Neck: supple. JVP 6-7 . Carotids 2+ bilat; no bruits. No lymphadenopathy or thryomegaly appreciated. Cor: PMI laterally displaced. Regular rate & rhythm. Wide split s2. + s3 Lungs: clear Abdomen: soft, nontender, nondistended. No hepatosplenomegaly. No bruits or masses. Good bowel sounds. Extremities: no cyanosis, clubbing, rash, edema Neuro: alert & orientedx3, cranial nerves grossly intact. moves all 4 extremities w/o difficulty. Affect pleasant  Telemetry: SR 90s.   Labs: Basic Metabolic Panel:  Recent Labs Lab 04/07/15 1910 04/08/15 0505 04/09/15 0326 04/09/15 1510 04/10/15 0526  NA 140 143 144  --  143  K 4.1 4.1 3.7  --  4.6  CL 109 107 104  --  107  CO2 20 27 29   --  26  GLUCOSE 99 82 73  --  87  BUN 20 19 20   --  20  CREATININE 1.28 1.36* 1.40* 1.23 1.22  CALCIUM 9.2 8.9 9.4  --  9.3    Liver Function Tests:  Recent Labs Lab 04/07/15 1910  AST 23   ALT 33  ALKPHOS 51  BILITOT 1.3*  PROT 6.2  ALBUMIN 3.6   No results for input(s): LIPASE, AMYLASE in the last 168 hours. No results for input(s): AMMONIA in the last 168 hours.  CBC:  Recent Labs Lab 04/07/15 1910 04/09/15 1510  WBC 6.8 5.4  NEUTROABS 4.1  --   HGB 15.8 15.6  HCT 46.9 46.9  MCV 92.3 91.8  PLT 277 258    Cardiac Enzymes: No results for input(s): CKTOTAL, CKMB, CKMBINDEX, TROPONINI in the last 168 hours.  BNP: BNP (last 3 results)  Recent Labs  04/07/15 1910  BNP 1763.7*    ProBNP (last 3 results) No results for input(s): PROBNP in the last 8760 hours.    Other results:  Imaging: No results found.   Medications:     Scheduled Medications: . digoxin  0.125 mg Oral Daily  . enoxaparin (LOVENOX) injection  40 mg Subcutaneous Q24H  . furosemide  40 mg Oral Daily  . lisinopril  10 mg Oral Daily  . sodium chloride  3 mL Intravenous Q12H  . sodium chloride  3 mL Intravenous Q12H  . spironolactone  25 mg Oral Daily    Infusions: . sodium chloride Stopped (04/09/15 1500)  . sodium chloride Stopped (04/09/15 1500)  PRN Medications: sodium chloride, sodium chloride, acetaminophen, ondansetron (ZOFRAN) IV, sodium chloride, sodium chloride   Assessment:   1. Acute systolic HF  --EF 38% 2. Mild HTN 3. LBBB  Plan/Discussion:    Feels better. Hemodynamics marginal. He is anxious to go home today. Will need close f/u. I will give one dose IV lasix prior to d/c.   Agree with d/c today on  Lasix 40 daily Spiro 25 daily Digoxin 0.125 daily Lisinopril 10 daily  No b-blocker yet. Long discussion about need for daily weights, etc. Will arrange HF f/u next week. D/w Dr. [name].   Length of Stay: 3   Glori Bickers MD 04/10/2015, 1:19 PM  Advanced Heart Failure Team Pager [phone] (M-F; [place])  Please contact [facility] for night-coverage after hours (4p -7a ) and weekends on [email or web address]

## 2015-04-10 NOTE — Progress Notes (Addendum)
Subjective:  Weight remains about the same.  Catheterization site is clean and dry.  Complained of very minimal dyspnea this morning but feels fine at the present time.  Slept well last night.  No cough or shortness of breath.  Objective:  Vital Signs in the last 24 hours: BP 106/75 mmHg  Pulse 94  Temp(Src) 98.3 F (36.8 C) (Oral)  Resp 18  Ht 6\' 1"  (1.854 m)  Wt 93.5 kg (206 lb 2.1 oz)  BMI 27.20 kg/m2  SpO2 98%  Physical Exam: Pleasant male currently in no acute distress Lungs:  Clear  Cardiac:  Regular rhythm, normal S1 and S2, soft S3 heard  Abdomen:  Soft, nontender, no masses Extremities:  No edema, cath site well healed  Intake/Output from previous day: 04/20 0701 - 04/21 0700 In: 703.7 [P.O.:603; I.V.:100.7] Out: 425 [Urine:425] Weight Filed Weights   04/08/15 1902 04/09/15 0427 04/10/15 0529  Weight: 96.707 kg (213 lb 3.2 oz) 93.668 kg (206 lb 8 oz) 93.5 kg (206 lb 2.1 oz)    Lab Results: Basic Metabolic Panel:  Recent Labs  04/09/15 0326 04/09/15 1510 04/10/15 0526  NA 144  --  143  K 3.7  --  4.6  CL 104  --  107  CO2 29  --  26  GLUCOSE 73  --  87  BUN 20  --  20  CREATININE 1.40* 1.23 1.22    CBC:  Recent Labs  04/07/15 1910 04/09/15 1510  WBC 6.8 5.4  NEUTROABS 4.1  --   HGB 15.8 15.6  HCT 46.9 46.9  MCV 92.3 91.8  PLT 277 258    BNP    Component Value Date/Time   BNP 1763.7* 04/07/2015 1910   Telemetry: Sinus rhythm with bundle branch block pattern  Assessment/Plan:  1.  Acute systolic congestive heart failure-clinically improved but significant increase in LVEDP at cath spike diuresis 2.  Left bundle-branch block 3.  Renal insufficiency improved  Recommendations:  Increase lisinopril and spironolactone.  He clinically looks as if he could go home.  Probably initiate beta blocker when heart failure recommends.  Will await Dr. Clayborne Dana recommendations.  Kerry Hough  MD [facility]  04/10/2015, 9:08  AM

## 2015-04-11 LAB — UIFE/LIGHT CHAINS/TP QN, 24-HR UR
Albumin, U: DETECTED
Alpha 1, Urine: DETECTED — AB
Alpha 2, Urine: DETECTED — AB
Beta, Urine: DETECTED — AB
Gamma Globulin, Urine: DETECTED — AB
Total Protein, Urine: 12 mg/dL (ref 5–25)

## 2015-04-14 ENCOUNTER — Telehealth (HOSPITAL_COMMUNITY): Payer: Self-pay | Admitting: Surgery

## 2015-04-14 ENCOUNTER — Ambulatory Visit (HOSPITAL_COMMUNITY)
Admission: RE | Admit: 2015-04-14 | Discharge: 2015-04-14 | Disposition: A | Discharge status: Home / Self Care | Payer: BLUE CROSS/BLUE SHIELD | Source: Ambulatory Visit | Attending: Internal Medicine | Admitting: Internal Medicine

## 2015-04-14 VITALS — BP 112/64 | HR 98 | Ht 73.0 in | Wt 199.5 lb

## 2015-04-14 DIAGNOSIS — Z79899 Other long term (current) drug therapy: Secondary | ICD-10-CM | POA: Diagnosis not present

## 2015-04-14 DIAGNOSIS — I429 Cardiomyopathy, unspecified: Secondary | ICD-10-CM | POA: Insufficient documentation

## 2015-04-14 DIAGNOSIS — Z8249 Family history of ischemic heart disease and other diseases of the circulatory system: Secondary | ICD-10-CM | POA: Diagnosis not present

## 2015-04-14 DIAGNOSIS — K219 Gastro-esophageal reflux disease without esophagitis: Secondary | ICD-10-CM | POA: Diagnosis not present

## 2015-04-14 DIAGNOSIS — I5022 Chronic systolic (congestive) heart failure: Secondary | ICD-10-CM | POA: Insufficient documentation

## 2015-04-14 DIAGNOSIS — Z823 Family history of stroke: Secondary | ICD-10-CM | POA: Insufficient documentation

## 2015-04-14 DIAGNOSIS — Z87891 Personal history of nicotine dependence: Secondary | ICD-10-CM | POA: Insufficient documentation

## 2015-04-14 DIAGNOSIS — I447 Left bundle-branch block, unspecified: Secondary | ICD-10-CM | POA: Diagnosis not present

## 2015-04-14 DIAGNOSIS — I10 Essential (primary) hypertension: Secondary | ICD-10-CM | POA: Insufficient documentation

## 2015-04-14 HISTORY — DX: Chronic systolic (congestive) heart failure: I50.22

## 2015-04-14 MED ORDER — CARVEDILOL 3.125 MG PO TABS: 3.1250 mg | ORAL_TABLET | Freq: Two times a day (BID) | ORAL | Status: DC

## 2015-04-14 NOTE — Telephone Encounter (Signed)
I left a message regarding Isaiah Wu appt.  He contacted me via email last Friday regarding his follow-up appt.  He is scheduled to be seen in the AHF clinic today Monday April 25th at 10:40am.  I asked that he call back with any concerns or questions related to his appt.

## 2015-04-14 NOTE — Patient Instructions (Signed)
Start Carvedilol 3.125 mg Twice daily   Your physician has requested that you have a cardiac MRI. Cardiac MRI uses a computer to create images of your heart as its beating, producing both still and moving pictures of your heart and major blood vessels. For further information please visit [email or web address] Please follow the instruction sheet given to you today for more information.  ONCE YOUR INSURANCE HAS APPROVED WE WILL CALL YOU TO SCHEDULE.  Your physician recommends that you schedule a follow-up appointment in: 2 weeks

## 2015-04-14 NOTE — Progress Notes (Signed)
Patient ID: Isaiah Wu, male   DOB: [birth year], 64 y.o.   MRN: [id number] PCP: Dr. Rex Wu  64 yo with minimal PMH developed dyspnea, orthopnea, and chest tightness fairly suddenly the Thursday before Easter.  He had no definite symptoms suggestive of a viral syndrome. Symptoms gradually worsened and he was referred to Dr Isaiah Wu.  Echo showed EF 15% with diffuse hypokinesis.  He was admitted to [facility] in 4/16 for diuresis.  RHC/LHC showed elevated filling pressures and no significant coronary disease.  HE was diuresed and dyspnea resolved.   Currently, he is back home with his wife.  He is able to lie flat now with no problems.  No chest pain, no palpitations.  No lightheadedness or syncope. He fatigues easily.  No dyspnea walking on flat ground.  He does get tired after walking up 2 flights of steps.    ECG: NSR, LBBB (168 msec)  Labs (4/16): TSH normal, SPEP negative, ferritin normal, HIV negative, K 4.6, creatinine 1.22  PMH: 1. HTN 2. GERD 3. LBBB 4. Cardiomyopathy: Nonischemic.  Echo (4/16) with EF 15%, severe diffuse hypokinesis Isaiah Wu).  RHC/LHC (4/16) with normal coronaries; mean RA 10, PA 58/35 mean 48, mean PCWP 32, no comment on CO.  HIV negative, TSH normal, ferritin normal, SPEP normal.    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  No cardiomyopathy or sudden death.   ROS: All systems reviewed and negative except as per HPI.   Current Outpatient Prescriptions  Medication Sig Dispense Refill  . digoxin (LANOXIN) 0.125 MG tablet Take 1 tablet (0.125 mg total) by mouth daily. 30 tablet 12  . furosemide (LASIX) 40 MG tablet Take 1 tablet (40 mg total) by mouth daily. 30 tablet 12  . lisinopril (PRINIVIL,ZESTRIL) 10 MG tablet Take 1 tablet (10 mg total) by mouth daily. 30 tablet 12  . spironolactone (ALDACTONE) 25 MG tablet Take 1 tablet (25 mg total) by mouth daily. 30 tablet 12  . carvedilol  (COREG) 3.125 MG tablet Take 1 tablet (3.125 mg total) by mouth 2 (two) times daily. 60 tablet 3   No current facility-administered medications for this encounter.   BP 112/64 mmHg  Pulse 98  Ht 6\' 1"  (1.854 m)  Wt 199 lb 8 oz (90.493 kg)  BMI 26.33 kg/m2  SpO2 98% General: NAD Neck: No JVD, no thyromegaly or thyroid nodule.  Lungs: Clear to auscultation bilaterally with normal respiratory effort. CV: Nondisplaced PMI.  Heart regular S1/S2, no S3/S4, no murmur.  No peripheral edema.  No carotid bruit.  Normal pedal pulses.  Abdomen: Soft, nontender, no hepatosplenomegaly, no distention.  Skin: Intact without lesions or rashes.  Neurologic: Alert and oriented x 3.  Psych: Normal affect. Extremities: No clubbing or cyanosis.  HEENT: Normal.   Assessment/Plan:  1. Chronic systolic CHF: Nonischemic cardiomyopathy, EF 15%.  Cause uncertain: normal coronaries, no symptoms suggestive of viral syndrome prior to admission, HIV/Ferritin/SPEP/TSH unremarkable.  No history of familial CMP.  He has LBBB of uncertain duration.  It is certainly possible that he could have a LBBB cardiomyopathy.  Currently NYHA class II symptoms.  He is not volume overloaded.   - I will arrange for cardiac MRI to assess for infiltrative disease or evidence of myocarditis.  - Continue lisinopril 10 mg daily and spironolactone 25 mg daily.  Raspy voice that started recently could be related to ACEI. Would consider transition to [facility] in the  future.  - Start Coreg 3.125 mg bid.  - Followup in 2 wks with office visit and labs (BMET and digoxin level).  - If EF remains low after 6 months medical treatment, would arrange for CRT-D.   2. LBBB: Of uncertain duration.

## 2015-04-17 ENCOUNTER — Other Ambulatory Visit (HOSPITAL_COMMUNITY): Payer: Self-pay | Admitting: Cardiology

## 2015-04-17 DIAGNOSIS — I5022 Chronic systolic (congestive) heart failure: Secondary | ICD-10-CM

## 2015-04-22 ENCOUNTER — Encounter: Payer: Self-pay | Admitting: Cardiology

## 2015-04-29 ENCOUNTER — Ambulatory Visit (HOSPITAL_COMMUNITY)
Admission: RE | Admit: 2015-04-29 | Discharge: 2015-04-29 | Disposition: A | Discharge status: Home / Self Care | Payer: BLUE CROSS/BLUE SHIELD | Source: Ambulatory Visit | Attending: Cardiology | Admitting: Cardiology

## 2015-04-29 ENCOUNTER — Encounter (HOSPITAL_COMMUNITY): Payer: Self-pay

## 2015-04-29 VITALS — BP 104/64 | HR 72 | Wt 194.0 lb

## 2015-04-29 DIAGNOSIS — I10 Essential (primary) hypertension: Secondary | ICD-10-CM | POA: Insufficient documentation

## 2015-04-29 DIAGNOSIS — Z79899 Other long term (current) drug therapy: Secondary | ICD-10-CM | POA: Insufficient documentation

## 2015-04-29 DIAGNOSIS — I5022 Chronic systolic (congestive) heart failure: Secondary | ICD-10-CM | POA: Insufficient documentation

## 2015-04-29 DIAGNOSIS — K219 Gastro-esophageal reflux disease without esophagitis: Secondary | ICD-10-CM | POA: Diagnosis not present

## 2015-04-29 DIAGNOSIS — Z8249 Family history of ischemic heart disease and other diseases of the circulatory system: Secondary | ICD-10-CM | POA: Insufficient documentation

## 2015-04-29 DIAGNOSIS — I447 Left bundle-branch block, unspecified: Secondary | ICD-10-CM | POA: Insufficient documentation

## 2015-04-29 DIAGNOSIS — Z823 Family history of stroke: Secondary | ICD-10-CM | POA: Insufficient documentation

## 2015-04-29 DIAGNOSIS — I429 Cardiomyopathy, unspecified: Secondary | ICD-10-CM | POA: Insufficient documentation

## 2015-04-29 MED ORDER — SACUBITRIL-VALSARTAN 24-26 MG PO TABS: 1.0000 | ORAL_TABLET | Freq: Two times a day (BID) | ORAL | Status: DC

## 2015-04-29 NOTE — Patient Instructions (Signed)
STOP Lisinopril.  START Entresto 24/26mg  tablet twice daily.  Return in 1-2 weeks for lab work.  Follow up 1 month for routine appointment.  Do the following things EVERYDAY: 1) Weigh yourself in the morning before breakfast. Write it down and keep it in a log. 2) Take your medicines as prescribed 3) Eat low salt foods-Limit salt (sodium) to 2000 mg per day.  4) Stay as active as you can everyday 5) Limit all fluids for the day to less than 2 liters

## 2015-04-29 NOTE — Progress Notes (Signed)
Patient ID: Isaiah Wu, male   DOB: Aug 25, 1951, 64 y.o.   MRN: [id number] PCP: Dr. Rex Kras  64 yo with minimal PMH developed dyspnea, orthopnea, and chest tightness fairly suddenly the Thursday before Easter.  He had no definite symptoms suggestive of a viral syndrome. Symptoms gradually worsened and he was referred to Dr [name].  Echo showed EF 15% with diffuse hypokinesis.  He was admitted to [facility] in 4/16 for diuresis.  RHC/LHC showed elevated filling pressures and no significant coronary disease.  He was diuresed and dyspnea resolved.   Currently, he is back home with his wife.  He is able to lie flat now with no problems.  No chest pain, no palpitations.  No lightheadedness or syncope. He fatigues easily.  No dyspnea walking on flat ground.  > 2 miles on Fitbit every day.  He does get tired after walking up 2 flights of steps.  Some dyspnea using leaf-blower on Saturday.  He has a dry cough and raspy voice ever since discharge from the hospital.  Also of note, he reports a ?insect bite in his axilla back in 2/16, remembers the mark it left.  He did not find a tick. Weight is down 5 lbs.   ECG: NSR, LBBB (168 msec)  Labs (4/16): TSH normal, SPEP negative, ferritin normal, HIV negative, K 4.6, creatinine 1.22, HCT 46.9  PMH: 1. HTN 2. GERD 3. LBBB 4. Cardiomyopathy: Nonischemic.  Echo (4/16) with EF 15%, severe diffuse hypokinesis [name]).  RHC/LHC (4/16) with normal coronaries; mean RA 10, PA 58/35 mean 48, mean PCWP 32, no comment on CO.  HIV negative, TSH normal, ferritin normal, SPEP normal.    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  No cardiomyopathy or sudden death.   ROS: All systems reviewed and negative except as per HPI.   Current Outpatient Prescriptions  Medication Sig Dispense Refill  . carvedilol (COREG) 3.125 MG tablet Take 1 tablet (3.125 mg total) by mouth 2 (two) times daily. 60 tablet  3  . digoxin (LANOXIN) 0.125 MG tablet Take 1 tablet (0.125 mg total) by mouth daily. 30 tablet 12  . furosemide (LASIX) 40 MG tablet Take 1 tablet (40 mg total) by mouth daily. 30 tablet 12  . spironolactone (ALDACTONE) 25 MG tablet Take 1 tablet (25 mg total) by mouth daily. 30 tablet 12  . sacubitril-valsartan (ENTRESTO) 24-26 MG Take 1 tablet by mouth 2 (two) times daily. 60 tablet 3   No current facility-administered medications for this encounter.   BP 104/64 mmHg  Pulse 72  Wt 194 lb (87.998 kg)  SpO2 98% General: NAD Neck: No JVD, no thyromegaly or thyroid nodule.  Lungs: Clear to auscultation bilaterally with normal respiratory effort. CV: Nondisplaced PMI.  Heart regular S1/S2, no S3/S4, no murmur.  No peripheral edema.  No carotid bruit.  Normal pedal pulses.  Abdomen: Soft, nontender, no hepatosplenomegaly, no distention.  Skin: Intact without lesions or rashes.  Neurologic: Alert and oriented x 3.  Psych: Normal affect. Extremities: No clubbing or cyanosis.  HEENT: Normal.   Assessment/Plan:  1. Chronic systolic CHF: Nonischemic cardiomyopathy, EF 15%.  Cause uncertain: normal coronaries, no symptoms suggestive of viral syndrome prior to admission, HIV/Ferritin/SPEP/TSH unremarkable.  No history of familial CMP.  He has LBBB of uncertain duration.  It is certainly possible that he could have a LBBB cardiomyopathy.  Currently NYHA class II symptoms.  He is not  volume overloaded.   - I will arrange for cardiac MRI to assess for infiltrative disease or evidence of myocarditis => scheduled for next week.  - Continue spironolactone 25 mg daily and Coreg 3.125 mg bid.   - Raspy voice that started recently could be related to ACEI. Stop lisinopril and start Entresto 24/26 bid. BMET/BNP in 10 days.  - Volume status looks good.  With addition of Entresto, think we can decrease Lasix to 20 mg daily.   - Followup in 2 wks with office visit and labs (BMET and digoxin level).  - ?Tick  bite in 2/16 => send Lyme serologies.  - If EF remains low after 6 months medical treatment, would arrange for CRT-D.   2. LBBB: Of uncertain duration.    Loralie Champagne 04/29/2015

## 2015-05-01 ENCOUNTER — Telehealth: Payer: Self-pay

## 2015-05-01 NOTE — Telephone Encounter (Signed)
Prior auth sent to [employer] [facility] for [employer] 24-26  Via Cover My Meds.

## 2015-05-02 ENCOUNTER — Telehealth: Payer: Self-pay

## 2015-05-02 NOTE — Telephone Encounter (Signed)
Approval for patient's Isaiah Wu given per [facility]

## 2015-05-07 ENCOUNTER — Ambulatory Visit (HOSPITAL_COMMUNITY)
Admission: RE | Admit: 2015-05-07 | Discharge: 2015-05-07 | Disposition: A | Discharge status: Home / Self Care | Payer: BLUE CROSS/BLUE SHIELD | Source: Ambulatory Visit | Attending: Internal Medicine | Admitting: Internal Medicine

## 2015-05-07 DIAGNOSIS — I429 Cardiomyopathy, unspecified: Secondary | ICD-10-CM | POA: Diagnosis not present

## 2015-05-07 DIAGNOSIS — I340 Nonrheumatic mitral (valve) insufficiency: Secondary | ICD-10-CM | POA: Diagnosis not present

## 2015-05-07 DIAGNOSIS — I5022 Chronic systolic (congestive) heart failure: Secondary | ICD-10-CM | POA: Diagnosis present

## 2015-05-07 MED ORDER — GADOBENATE DIMEGLUMINE 529 MG/ML IV SOLN
30.0000 mL | Freq: Once | INTRAVENOUS | Status: AC
  Administered 2015-05-07: 30 mL via INTRAVENOUS

## 2015-05-08 ENCOUNTER — Telehealth (HOSPITAL_COMMUNITY): Payer: Self-pay | Admitting: Vascular Surgery

## 2015-05-08 NOTE — Telephone Encounter (Signed)
Pt called he had an MRI yesterday he was told if he didn't hear something by noon today to call office for results.. Please advise

## 2015-05-08 NOTE — Telephone Encounter (Signed)
Left mess will have Dr Aundra Dubin review 5/20 and call him back

## 2015-05-08 NOTE — Telephone Encounter (Signed)
EF remains low around 15%.  Severely dilated LV.  Septal-lateral dyssynchrony suggests that CRT would potentially be helpful.  Enhancement pattern suggests possible myocarditis but subtle. Please report this to patient.

## 2015-05-12 ENCOUNTER — Telehealth (HOSPITAL_COMMUNITY): Payer: Self-pay | Admitting: Surgery

## 2015-05-12 NOTE — Telephone Encounter (Signed)
Done

## 2015-05-12 NOTE — Telephone Encounter (Signed)
I received an email from Isaiah Wu regarding his recent MRI results.  He has requested a call from Dr. Aundra Dubin.

## 2015-05-12 NOTE — Telephone Encounter (Signed)
Pt request a call from Dr Aundra Dubin to discuss results

## 2015-05-12 NOTE — Telephone Encounter (Signed)
I called him.

## 2015-05-14 ENCOUNTER — Ambulatory Visit (HOSPITAL_COMMUNITY)
Admission: RE | Admit: 2015-05-14 | Discharge: 2015-05-14 | Disposition: A | Discharge status: Home / Self Care | Payer: BLUE CROSS/BLUE SHIELD | Source: Ambulatory Visit | Attending: Internal Medicine | Admitting: Internal Medicine

## 2015-05-14 DIAGNOSIS — I5022 Chronic systolic (congestive) heart failure: Secondary | ICD-10-CM | POA: Diagnosis not present

## 2015-05-14 LAB — BASIC METABOLIC PANEL
Anion gap: 8 (ref 5–15)
BUN: 20 mg/dL (ref 6–20)
CO2: 24 mmol/L (ref 22–32)
Calcium: 9.7 mg/dL (ref 8.9–10.3)
Chloride: 107 mmol/L (ref 101–111)
Creatinine, Ser: 1.18 mg/dL (ref 0.61–1.24)
GFR calc Af Amer: >60 mL/min (ref >60)
GFR calc non Af Amer: >60 mL/min (ref >60)
Glucose, Bld: 103 mg/dL — ABNORMAL HIGH (ref 65–99)
Potassium: 4.2 mmol/L (ref 3.5–5.1)
Sodium: 139 mmol/L (ref 135–145)

## 2015-05-14 LAB — DIGOXIN LEVEL: Digoxin Level: 0.4 ng/mL — ABNORMAL LOW (ref 0.8–2.0)

## 2015-05-14 LAB — BRAIN NATRIURETIC PEPTIDE: B Natriuretic Peptide: 363.7 pg/mL — ABNORMAL HIGH (ref 0.0–100.0)

## 2015-05-15 NOTE — Progress Notes (Signed)
Spoke with pt and gave lab results

## 2015-05-16 ENCOUNTER — Encounter (HOSPITAL_COMMUNITY): Payer: Self-pay | Admitting: Infectious Diseases

## 2015-05-30 ENCOUNTER — Ambulatory Visit (HOSPITAL_COMMUNITY)
Admission: RE | Admit: 2015-05-30 | Discharge: 2015-05-30 | Disposition: A | Discharge status: Home / Self Care | Payer: BLUE CROSS/BLUE SHIELD | Source: Ambulatory Visit | Attending: Internal Medicine | Admitting: Internal Medicine

## 2015-05-30 VITALS — BP 108/78 | HR 81 | Wt 195.0 lb

## 2015-05-30 DIAGNOSIS — Z823 Family history of stroke: Secondary | ICD-10-CM | POA: Insufficient documentation

## 2015-05-30 DIAGNOSIS — I10 Essential (primary) hypertension: Secondary | ICD-10-CM | POA: Insufficient documentation

## 2015-05-30 DIAGNOSIS — I429 Cardiomyopathy, unspecified: Secondary | ICD-10-CM | POA: Diagnosis not present

## 2015-05-30 DIAGNOSIS — R55 Syncope and collapse: Secondary | ICD-10-CM | POA: Insufficient documentation

## 2015-05-30 DIAGNOSIS — I447 Left bundle-branch block, unspecified: Secondary | ICD-10-CM | POA: Diagnosis not present

## 2015-05-30 DIAGNOSIS — Z79899 Other long term (current) drug therapy: Secondary | ICD-10-CM | POA: Insufficient documentation

## 2015-05-30 DIAGNOSIS — I5022 Chronic systolic (congestive) heart failure: Secondary | ICD-10-CM | POA: Insufficient documentation

## 2015-05-30 DIAGNOSIS — K219 Gastro-esophageal reflux disease without esophagitis: Secondary | ICD-10-CM | POA: Diagnosis not present

## 2015-05-30 DIAGNOSIS — Z8249 Family history of ischemic heart disease and other diseases of the circulatory system: Secondary | ICD-10-CM | POA: Insufficient documentation

## 2015-05-30 DIAGNOSIS — Z87891 Personal history of nicotine dependence: Secondary | ICD-10-CM | POA: Diagnosis not present

## 2015-05-30 HISTORY — DX: Syncope and collapse: R55

## 2015-05-30 LAB — CBC
HCT: 48.1 % (ref 39.0–52.0)
Hemoglobin: 17.1 g/dL — ABNORMAL HIGH (ref 13.0–17.0)
MCH: 30.2 pg (ref 26.0–34.0)
MCHC: 35.6 g/dL (ref 30.0–36.0)
MCV: 85 fL (ref 78.0–100.0)
Platelets: 291 10*3/uL (ref 150–400)
RBC: 5.66 MIL/uL (ref 4.22–5.81)
RDW: 13.2 % (ref 11.5–15.5)
WBC: 5.9 10*3/uL (ref 4.0–10.5)

## 2015-05-30 LAB — BASIC METABOLIC PANEL
Anion gap: 12 (ref 5–15)
BUN: 15 mg/dL (ref 6–20)
CO2: 21 mmol/L — ABNORMAL LOW (ref 22–32)
Calcium: 9.9 mg/dL (ref 8.9–10.3)
Chloride: 104 mmol/L (ref 101–111)
Creatinine, Ser: 1.08 mg/dL (ref 0.61–1.24)
GFR calc Af Amer: >60 mL/min (ref >60)
GFR calc non Af Amer: >60 mL/min (ref >60)
Glucose, Bld: 101 mg/dL — ABNORMAL HIGH (ref 65–99)
Potassium: 3.8 mmol/L (ref 3.5–5.1)
Sodium: 137 mmol/L (ref 135–145)

## 2015-05-30 MED ORDER — FUROSEMIDE 40 MG PO TABS: 40.0000 mg | ORAL_TABLET | ORAL | Status: DC | PRN

## 2015-05-30 NOTE — Patient Instructions (Addendum)
Change Furosemide (Lasxi) to take only AS NEEDED for weight of 195 lb or greater  Your physician has recommended that you wear an event monitor. Event monitors are medical devices that record the heart's electrical activity. Doctors most often [place] these monitors to diagnose arrhythmias. Arrhythmias are problems with the speed or rhythm of the heartbeat. The monitor is a small, portable device. You can wear one while you do your normal daily activities. This is usually used to diagnose what is causing palpitations/syncope (passing out).  Your physician recommends that you schedule a follow-up appointment in: 6 weeks

## 2015-05-30 NOTE — Progress Notes (Signed)
Patient ID: Isaiah Wu, male   DOB: [birth year], 64 y.o.   MRN: [id number] PCP: Dr. Rex Kras  64 y.o. with minimal PMH developed dyspnea, orthopnea, and chest tightness fairly suddenly the Thursday before Easter.  He had no definite symptoms suggestive of a viral syndrome. Symptoms gradually worsened and he was referred to Dr [name].  Echo showed EF 15% with diffuse hypokinesis.  He was admitted to [facility] in 4/16 for diuresis.  RHC/LHC showed elevated filling pressures and no significant coronary disease.  He was diuresed and dyspnea resolved.   He returns for HF follow up. Denies SOB with steps. Had syncope. Currently, he is back home with his wife.  He is able to lie flat now with no problems.  No chest pain, no palpitations.  Had syncopal episode on 6/3 while walking in his house. Weight at home 192-193 popunds. Working 4 hours per day. SBP 90-110. Walking 2 1/2 miles through out the day.    Labs (4/16): TSH normal, SPEP negative, ferritin normal, HIV negative, K 4.6, creatinine 1.22, HCT 46.9  PMH: 1. HTN 2. GERD 3. LBBB 4. Cardiomyopathy: Nonischemic.  Echo (4/16) with EF 15%, severe diffuse hypokinesis [name]).  RHC/LHC (4/16) with normal coronaries; mean RA 10, PA 58/35 mean 48, mean PCWP 32, no comment on CO.  HIV negative, TSH normal, ferritin normal, SPEP normal. 5. CMRI  04/2015 -  Severely dilated LV with EF 14%. There was septal-lateral dyssynchrony (prominent).2. Normal RV size with mild to moderately decreased systolic function. At least moderate functional mitral regurgitation.   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  No cardiomyopathy or sudden death.   ROS: All systems reviewed and negative except as per HPI.   Current Outpatient Prescriptions  Medication Sig Dispense Refill  . carvedilol (COREG) 3.125 MG tablet Take 1 tablet (3.125 mg total) by mouth 2 (two) times daily. 60 tablet 3  .  digoxin (LANOXIN) 0.125 MG tablet Take 1 tablet (0.125 mg total) by mouth daily. 30 tablet 12  . furosemide (LASIX) 40 MG tablet Take 1 tablet (40 mg total) by mouth daily. (Patient taking differently: Take 20 mg by mouth daily. ) 30 tablet 12  . sacubitril-valsartan (ENTRESTO) 24-26 MG Take 1 tablet by mouth 2 (two) times daily. 60 tablet 3  . spironolactone (ALDACTONE) 25 MG tablet Take 1 tablet (25 mg total) by mouth daily. 30 tablet 12   No current facility-administered medications for this encounter.   BP 108/78 mmHg  Pulse 81  Wt 195 lb (88.451 kg)  SpO2 97% General: NAD Neck: No JVD, no thyromegaly or thyroid nodule.  Lungs: Clear to auscultation bilaterally with normal respiratory effort. CV: Nondisplaced PMI.  Heart regular S1/S2, no S3/S4, no murmur.  No peripheral edema.  No carotid bruit.  Normal pedal pulses.  Abdomen: Soft, nontender, no hepatosplenomegaly, no distention.  Skin: Intact without lesions or rashes.  Neurologic: Alert and oriented x 3.  Psych: Normal affect. Extremities: No clubbing or cyanosis.  HEENT: Normal.   Assessment/Plan:  1. Chronic systolic CHF: Nonischemic cardiomyopathy, EF 15%.  Cause uncertain: normal coronaries, no symptoms suggestive of viral syndrome prior to admission, HIV/Ferritin/SPEP/TSH unremarkable.  No history of familial CMP.  He has LBBB of uncertain duration.  It is certainly possible that he could have a LBBB cardiomyopathy.  Had CMRI 5/18--> EF 14% RV normal Mod MR  ? Myocarditis.  Currently NYHA class II symptoms.  Volume status stable. Change lasix to as needed. He is not volume overloaded.   Continue spironolactone 25 mg daily -Continue Coreg 3.125 mg bid.  BP at home soft. Hold off on up titration.  - Entresto 24/26 bid. Will not increase with soft BP.  - If EF remains low after 6 months medical treatment, would arrange for CRT-D.   2. LBBB: Of uncertain duration.   3. Syncope: Check CBC, BMET. Place 30 day event monitor    Follow up in 4.6 weeks.   Lab work today ok. Hgb 17. K 3.8 Creatinine 1.08   CLEGG,AMY NP-C  05/30/2015

## 2015-05-30 NOTE — Progress Notes (Signed)
Advanced Heart Failure Medication Review by a Pharmacist  Does the patient  feel that his/her medications are working for him/her?  yes  Has the patient been experiencing any side effects to the medications prescribed?  Yes - some hypotension  Does the patient measure his/her own blood pressure or blood glucose at home?  yes   Does the patient have any problems obtaining medications due to transportation or finances?   no  Understanding of regimen: good Understanding of indications: good Potential of compliance: excellent    Pharmacist comments: Patient presents to heart failure clinic with his wife and medications were reviewed with a pharmacist. The only discrepancy noted is that pt is taking Lasix 20mg  daily rather than 40mg . Also reports some hypotension. He checks his BP at home and systolic runs in the high 90s to low 100s - he reports one episode of fainting last week because he thinks he stood up too quickly. Discussed importance of standing slowly and staying well hydrated (but still limiting fluid to <2L daily).   Megan E. Supple, Pharm.D Clinical Pharmacy Resident Pager: [phone] 05/30/2015 9:52 AM

## 2015-06-06 ENCOUNTER — Ambulatory Visit (INDEPENDENT_AMBULATORY_CARE_PROVIDER_SITE_OTHER): Payer: BLUE CROSS/BLUE SHIELD

## 2015-06-06 DIAGNOSIS — R55 Syncope and collapse: Secondary | ICD-10-CM | POA: Diagnosis not present

## 2015-06-25 ENCOUNTER — Encounter (HOSPITAL_COMMUNITY): Payer: Self-pay

## 2015-06-25 NOTE — Progress Notes (Signed)
Disability claims form for [employer] completed and signed by Dr. Loralie Champagne.  Copy scanned into electronic medical records.  Patient to pickup original copy tomorrow at [facility].  [name]

## 2015-06-25 NOTE — Progress Notes (Signed)
[employer] faxed medical record request for patient from April 2016-present. All available records faxed to provided # [phone] Claim # [id number] Policy # [id number] Copy of request scanned into electronic medical records.  [name]

## 2015-07-11 ENCOUNTER — Ambulatory Visit (HOSPITAL_COMMUNITY)
Admission: RE | Admit: 2015-07-11 | Discharge: 2015-07-11 | Disposition: A | Discharge status: Home / Self Care | Payer: BLUE CROSS/BLUE SHIELD | Source: Ambulatory Visit | Attending: Internal Medicine | Admitting: Internal Medicine

## 2015-07-11 ENCOUNTER — Encounter (HOSPITAL_COMMUNITY): Payer: Self-pay | Admitting: *Deleted

## 2015-07-11 VITALS — BP 90/50 | HR 82 | Wt 197.5 lb

## 2015-07-11 DIAGNOSIS — I447 Left bundle-branch block, unspecified: Secondary | ICD-10-CM | POA: Insufficient documentation

## 2015-07-11 DIAGNOSIS — R55 Syncope and collapse: Secondary | ICD-10-CM | POA: Diagnosis not present

## 2015-07-11 DIAGNOSIS — I5022 Chronic systolic (congestive) heart failure: Secondary | ICD-10-CM | POA: Diagnosis present

## 2015-07-11 MED ORDER — FUROSEMIDE 40 MG PO TABS: 40.0000 mg | ORAL_TABLET | ORAL | Status: DC | PRN

## 2015-07-11 MED ORDER — CARVEDILOL 3.125 MG PO TABS: 6.2500 mg | ORAL_TABLET | Freq: Two times a day (BID) | ORAL | Status: DC

## 2015-07-11 NOTE — Progress Notes (Signed)
Patient ID: Isaiah Wu, male   DOB: [birth year], 64 y.o.   MRN: [id number]  PCP: Dr. Rex Kras  64 yo with minimal PMH developed dyspnea, orthopnea, and chest tightness fairly suddenly the Thursday before Easter.  He had no definite symptoms suggestive of a viral syndrome. Symptoms gradually worsened and he was referred to Dr [name].  Echo showed EF 15% with diffuse hypokinesis.  He was admitted to [facility] in 4/16 for diuresis.  RHC/LHC showed elevated filling pressures and no significant coronary disease.  He was diuresed and dyspnea resolved.   He returns for HF follow up.  Overall better, but feels as if his progressed has plateaued over past several weeks. Weight up two lbs and BP down in clinic today. He had [place] food last night and took an extra lasix pill this morning. Currently, he is back home with his wife.  He is able to lie flat now with no problems.  No chest pain, no palpitations.  Had syncopal episode on 6/3 while standing up. Nothing similar since. Has been wearing 30-day event monitor. Weight at home 192-194 popunds. Working 4-5 hours per day. SBP 90-110. Walking up to 2 1/2 miles a day.  Will occasionally have tired spells occasionally after he has been working a lot. Notes a dysgeusia as well, but only during tired spells. Does not seem to be related to food, and denies any GERD-like symptoms. Says he is feeling better but feels like he has plateaued some. Says he feels about 5/10.    Labs 4/16: TSH normal, SPEP negative, ferritin normal, HIV negative, K 4.6, creatinine 1.22, HCT 46.9 Labs 6/16: K 3.8, Creatinine 1.08  PMH: 1. HTN 2. GERD 3. LBBB 4. Cardiomyopathy: Nonischemic.  Echo (4/16) with EF 15%, severe diffuse hypokinesis [name]).  RHC/LHC (4/16) with normal coronaries; mean RA 10, PA 58/35 mean 48, mean PCWP 32, no comment on CO.  HIV negative, TSH normal, ferritin normal, SPEP normal. 5. CMRI  04/2015 -  Severely dilated LV with EF 14%. There was septal-lateral dyssynchrony  (prominent).2. Normal RV size with mild to moderately decreased systolic function. At least moderate functional mitral regurgitation.On delayed enhancement imaging, there did appear to be some subtle late gadolinium enhancement (LGE). There was basal inferoseptal subepicardial LGE (small area) and apical septal subtle mid-wall LGE (small area).   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  No cardiomyopathy or sudden death.   ROS: All systems reviewed and negative except as per HPI.   Current Outpatient Prescriptions  Medication Sig Dispense Refill  . carvedilol (COREG) 3.125 MG tablet Take 1 tablet (3.125 mg total) by mouth 2 (two) times daily. 60 tablet 3  . digoxin (LANOXIN) 0.125 MG tablet Take 1 tablet (0.125 mg total) by mouth daily. 30 tablet 12  . furosemide (LASIX) 40 MG tablet Take 1 tablet (40 mg total) by mouth as needed. For weight of 195 lb or greater 30 tablet 12  . sacubitril-valsartan (ENTRESTO) 24-26 MG Take 1 tablet by mouth 2 (two) times daily. 60 tablet 3  . spironolactone (ALDACTONE) 25 MG tablet Take 1 tablet (25 mg total) by mouth daily. 30 tablet 12   No current facility-administered medications for this encounter.   BP 90/50 mmHg  Pulse 82  Wt 197 lb 8 oz (89.585 kg)  SpO2 99% General: NAD Neck: No JVD, no thyromegaly or thyroid nodule.  Lungs: Clear to auscultation bilaterally with normal  respiratory effort. CV: Nondisplaced PMI.  Heart regular S1/S2, no S3/S4, no murmur.  No peripheral edema.  No carotid bruit.  Normal pedal pulses.  Abdomen: Soft, nontender, no hepatosplenomegaly, mild distention.  Skin: Intact without lesions or rashes.  Neurologic: Alert and oriented x 3.  Psych: Normal affect. Extremities: No clubbing or cyanosis.  HEENT: Normal.   Assessment/Plan:  1. Chronic systolic CHF: Nonischemic cardiomyopathy, EF 15%.Currently NYHA class II-early III -  Etiology uncertain: normal coronaries, no symptoms suggestive of viral syndrome prior to admission, HIV/Ferritin/SPEP/TSH unremarkable.  No history of familial CMP.  He has LBBB of uncertain duration.  ? LBBB cardiomyopathy.  - Had CMRI 5/18--> EF 14% RV normal Mod MR  ? Myocarditis.  - Volume status stable. Take lasix to as needed for 195 or above. He is not volume overloaded.    - Continue spironolactone 25 mg daily  - Continue Coreg 3.125 mg bid.  BP at home soft. Hold off on up titration.  - Continue Entresto 24/26 bid. Will not increase with soft BP.  - If EF remains low after 6 months medical treatment, would arrange for CRT-D.   2. LBBB: Of uncertain duration.   3. Syncope: Event monitor by Dr Haroldine Laws today.  No tachy or otherwise worrisome events.  Follow up in 4-6 weeks.  Labs today.  Shirley Friar PA-C 07/11/2015  Patient seen and examined with Oda Kilts, PA-C. We discussed all aspects of the encounter. I agree with the assessment and plan as stated above.    Doing well. NYHA II-III. Volume status low. BP soft. Will increase carvedilol to 3.125/6.25 then will go to 6.25 bid. Increase dry weight to 198. 30-day event monitor reviewed personally. No events. Suspect syncopal episode was orthostasis. Will do bedside echo today. If EF not improving will need EP referral for CRT-D. If EF improving will continue to wait.   Kimani Hovis,MD 10:37 AM

## 2015-07-11 NOTE — Patient Instructions (Addendum)
Increase Carvedilol to 3.125 mg in AM and 6.25 mg in PM for 1 week,  THEN if feeling ok INCREASE to 6.25 mg Twice daily   Take Lasix for weight of 198 lb or greater  You have been referred to Dr Caryl Comes  Your physician recommends that you schedule a follow-up appointment in: 4 weeks

## 2015-07-11 NOTE — Addendum Note (Signed)
Encounter addended by: Scarlette Calico, RN on: 07/11/2015 10:44 AM<BR>     Documentation filed: Patient Instructions Section, Orders

## 2015-07-11 NOTE — Addendum Note (Signed)
Encounter addended by: Scarlette Calico, RN on: 07/11/2015 11:05 AM<BR>     Documentation filed: [employer], Patient Instructions Section, Orders

## 2015-07-18 ENCOUNTER — Encounter: Payer: Self-pay | Admitting: Internal Medicine

## 2015-07-18 ENCOUNTER — Ambulatory Visit (INDEPENDENT_AMBULATORY_CARE_PROVIDER_SITE_OTHER): Payer: BLUE CROSS/BLUE SHIELD | Admitting: Internal Medicine

## 2015-07-18 VITALS — BP 94/56 | HR 66 | Ht 73.0 in | Wt 202.2 lb

## 2015-07-18 DIAGNOSIS — I447 Left bundle-branch block, unspecified: Secondary | ICD-10-CM | POA: Diagnosis not present

## 2015-07-18 DIAGNOSIS — Z01812 Encounter for preprocedural laboratory examination: Secondary | ICD-10-CM

## 2015-07-18 DIAGNOSIS — R55 Syncope and collapse: Secondary | ICD-10-CM

## 2015-07-18 LAB — BASIC METABOLIC PANEL
BUN: 18 mg/dL (ref 6–23)
CO2: 24 mEq/L (ref 19–32)
Calcium: 9.7 mg/dL (ref 8.4–10.5)
Chloride: 106 mEq/L (ref 96–112)
Creatinine, Ser: 0.99 mg/dL (ref 0.40–1.50)
GFR: 81.38 mL/min (ref >60.00)
Glucose, Bld: 98 mg/dL (ref 70–99)
Potassium: 4.1 mEq/L (ref 3.5–5.1)
Sodium: 139 mEq/L (ref 135–145)

## 2015-07-18 LAB — CBC WITH DIFFERENTIAL/PLATELET
Basophils Absolute: 0 10*3/uL (ref 0.0–0.1)
Basophils Relative: 0.4 % (ref 0.0–3.0)
Eosinophils Absolute: 0.4 10*3/uL (ref 0.0–0.7)
Eosinophils Relative: 5.3 % — ABNORMAL HIGH (ref 0.0–5.0)
HCT: 46.4 % (ref 39.0–52.0)
Hemoglobin: 15.6 g/dL (ref 13.0–17.0)
Lymphocytes Relative: 23.6 % (ref 12.0–46.0)
Lymphs Abs: 1.7 10*3/uL (ref 0.7–4.0)
MCHC: 33.6 g/dL (ref 30.0–36.0)
MCV: 89.5 fl (ref 78.0–100.0)
Monocytes Absolute: 0.8 10*3/uL (ref 0.1–1.0)
Monocytes Relative: 10.8 % (ref 3.0–12.0)
Neutro Abs: 4.3 10*3/uL (ref 1.4–7.7)
Neutrophils Relative %: 59.9 % (ref 43.0–77.0)
Platelets: 279 10*3/uL (ref 150.0–400.0)
RBC: 5.19 Mil/uL (ref 4.22–5.81)
RDW: 16.6 % — ABNORMAL HIGH (ref 11.5–15.5)
WBC: 7.2 10*3/uL (ref 4.0–10.5)

## 2015-07-18 LAB — PROTIME-INR
INR: 1.1 ratio — ABNORMAL HIGH (ref 0.8–1.0)
Prothrombin Time: 12 s (ref 9.6–13.1)

## 2015-07-18 NOTE — Patient Instructions (Addendum)
  Medication Instructions:  Your physician recommends that you continue on your current medications as directed. Please refer to the Current Medication list given to you today.   Labwork: Your physician recommends that you return for lab work TODAY   Testing/Procedures: none  Follow-Up: Your physician recommends that you schedule a follow-up appointment in:  7-10 days from 8/1 in [facility] for Wound Check.  Your physician recommends that you schedule a follow-up appointment in: 3 months with Dr. Caryl Comes.   Any Other Special Instructions Will Be Listed Below (If Applicable).

## 2015-07-18 NOTE — Progress Notes (Signed)
ELECTROPHYSIOLOGY CONSULT NOTE  Patient ID: Isaiah Wu, MRN: [id number], DOB/AGE: 1951-12-07 64 y.o. Admit date: (Not on file) Date of Consult: 07/18/2015  Primary Physician: Gennette Pac, MD Primary Cardiologist: WST/CHF Chief Complaint: ICD    HPI Isaiah Wu is a 64 y.o. male  Referred for consideration of an ICD.  He had been well until April 2016 when he presented with the sudden onset of congestive symptoms. He underwent catheterization demonstrating an LVEF of about 15%. 4/16. He has been treated with guideline directed medical therapy with a marked attenuation of congestive symptoms; he has"mostly with fatigue.  Cardiac MR 5/16 demonstrated severe LV dysfunction-14% with moderate MR and a nonspecific LGA pattern suggestive but not specfic  of myocarditis  He denies chest pain nocturnal dyspnea or edema; he has not had palpitations. He had an episode of syncope a few weeks ago. He stood up to go from his chair to the kitchen and collapsed. He took some time on the floor prior to standing. It was felt to be orthostatic in nature. He has not had other orthostatic lightheadedness.        Past Medical History  Diagnosis Date  . Cardiomyopathy 04/07/2015  . Hypertension   . LBBB (left bundle branch block)   . GERD (gastroesophageal reflux disease)       Surgical History:  Past Surgical History  Procedure Laterality Date  . Tonsillectomy    . Shoulder surgery Left   . Orif elbow fracture    . Left and right heart catheterization with coronary angiogram N/A 04/09/2015    Procedure: LEFT AND RIGHT HEART CATHETERIZATION WITH CORONARY ANGIOGRAM;  Surgeon: Jacolyn Reedy, MD;  Location: [facility] CATH LAB;  Service: Cardiovascular;  Laterality: N/A;     Home Meds: Prior to Admission medications   Medication Sig Start Date End Date Taking? Authorizing Provider  carvedilol (COREG) 3.125 MG tablet Take 2 tablets (6.25 mg total) by mouth 2 (two) times daily. 07/11/15  Yes  Jolaine Artist, MD  digoxin (LANOXIN) 0.125 MG tablet Take 1 tablet (0.125 mg total) by mouth daily. 04/10/15  Yes Jacolyn Reedy, MD  furosemide (LASIX) 40 MG tablet Take 1 tablet (40 mg total) by mouth as needed (for weight 198 lb or greater). 07/11/15  Yes Shaune Pascal Bensimhon, MD  sacubitril-valsartan (ENTRESTO) 24-26 MG Take 1 tablet by mouth 2 (two) times daily. 04/29/15  Yes Larey Dresser, MD  spironolactone (ALDACTONE) 25 MG tablet Take 1 tablet (25 mg total) by mouth daily. 04/10/15  Yes Jacolyn Reedy, MD    I  Allergies: No Known Allergies  History   Social History  . Marital Status: Married    Spouse Name: N/A  . Number of Children: N/A  . Years of Education: N/A   Occupational History  . Not on file.   Social History Main Topics  . Smoking status: Former Research scientist (life sciences)  . Smokeless tobacco: Former Systems developer  . Alcohol Use: 0.0 oz/week    0 Standard drinks or equivalent per week     Comment: one glass of wine daily   . Drug Use: No  . Sexual Activity: Yes   Other Topics Concern  . Not on file   Social History Narrative     Family History  Problem Relation Age of Onset  . Bone cancer Father   . Diabetes Father   . CVA Mother      ROS:  Please see the history of present illness.  All other systems reviewed and negative.    Physical Exam:   Blood pressure 94/56, pulse 66, height 6\' 1"  (1.854 m), weight 202 lb 3.2 oz (91.717 kg). General: Well developed, well nourished male in no acute distress. Head: Normocephalic, atraumatic, sclera non-icteric, no xanthomas, nares are without discharge. EENT: normal Lymph Nodes:  none Back: without scoliosis/kyphosis  no CVA tendersness Neck: Negative for carotid bruits. JVD not elevated. Lungs: Clear bilaterally to auscultation without wheezes, rales, or rhonchi. Breathing is unlabored. Heart: RRR with S1 S2.  2/6 systolic murmur; dyskinetic PMI, rubs, or gallops appreciated. Abdomen: Soft, non-tender, non-distended with  normoactive bowel sounds. No hepatomegaly. No rebound/guarding. No obvious abdominal masses. Msk:  Strength and tone appear normal for age. Extremities: No clubbing or cyanosis. No edema.  Distal pedal pulses are 2+ and equal bilaterally. Skin: Warm and Dry Neuro: Alert and oriented X 3. CN III-XII intact Grossly normal sensory and motor function . Psych:  Responds to questions appropriately with a normal affect.      Labs: Cardiac Enzymes No results for input(s): CKTOTAL, CKMB, TROPONINI in the last 72 hours. CBC Lab Results  Component Value Date   WBC 5.9 05/30/2015   HGB 17.1* 05/30/2015   HCT 48.1 05/30/2015   MCV 85.0 05/30/2015   PLT 291 05/30/2015   PROTIME: No results for input(s): LABPROT, INR in the last 72 hours. Chemistry No results for input(s): NA, K, CL, CO2, BUN, CREATININE, CALCIUM, PROT, BILITOT, ALKPHOS, ALT, AST, GLUCOSE in the last 168 hours.  Invalid input(s): LABALBU Lipids No results found for: CHOL, HDL, LDLCALC, TRIG BNP No results found for: PROBNP Thyroid Function Tests: No results for input(s): TSH, T4TOTAL, T3FREE, THYROIDAB in the last 72 hours.  Invalid input(s): FREET3    Miscellaneous No results found for: DDIMER  Radiology/Studies:  No results found.  EKG: NSR  66 20/18/43 lbbb   Assessment and Plan:   NON iscxhemic cardiomyopathy  LBBB  Syncope  CHF  chroninc systolic  The pt has persistent LV dysfunction presumably attributable to myocarditis, or some other diffuse injury based on MRI,  He is appropriately considered for CRT-D for risk reduction of sudden death and hopeful improvement in functional status  His syncopal episode while possibly orthostatic is worrsiome nonetheless  We discussed the physiology and concerns of his NICM and CHF  Have reviewed the potential benefits and risks of ICD implantation including but not limited to death, perforation of heart or lung, lead dislodgement, infection,  device  malfunction and inappropriate shocks.  The patient and familyexpress understanding  and are willing to proceed.    Continue current meds      Virl Axe

## 2015-07-21 ENCOUNTER — Encounter (HOSPITAL_COMMUNITY)
Admission: RE | Disposition: A | Discharge status: Home / Self Care | Payer: BLUE CROSS/BLUE SHIELD | Source: Ambulatory Visit | Attending: Internal Medicine

## 2015-07-21 ENCOUNTER — Encounter (HOSPITAL_COMMUNITY): Payer: Self-pay | Admitting: Internal Medicine

## 2015-07-21 ENCOUNTER — Ambulatory Visit (HOSPITAL_COMMUNITY)
Admission: RE | Admit: 2015-07-21 | Discharge: 2015-07-22 | Disposition: A | Discharge status: Home / Self Care | Payer: BLUE CROSS/BLUE SHIELD | Source: Ambulatory Visit | Attending: Internal Medicine | Admitting: Internal Medicine

## 2015-07-21 DIAGNOSIS — I428 Other cardiomyopathies: Secondary | ICD-10-CM

## 2015-07-21 DIAGNOSIS — I447 Left bundle-branch block, unspecified: Secondary | ICD-10-CM | POA: Insufficient documentation

## 2015-07-21 DIAGNOSIS — I10 Essential (primary) hypertension: Secondary | ICD-10-CM | POA: Insufficient documentation

## 2015-07-21 DIAGNOSIS — K219 Gastro-esophageal reflux disease without esophagitis: Secondary | ICD-10-CM | POA: Insufficient documentation

## 2015-07-21 DIAGNOSIS — R55 Syncope and collapse: Secondary | ICD-10-CM | POA: Diagnosis not present

## 2015-07-21 DIAGNOSIS — I429 Cardiomyopathy, unspecified: Secondary | ICD-10-CM | POA: Diagnosis present

## 2015-07-21 DIAGNOSIS — I509 Heart failure, unspecified: Secondary | ICD-10-CM | POA: Insufficient documentation

## 2015-07-21 DIAGNOSIS — Z959 Presence of cardiac and vascular implant and graft, unspecified: Secondary | ICD-10-CM

## 2015-07-21 DIAGNOSIS — Z95810 Presence of automatic (implantable) cardiac defibrillator: Secondary | ICD-10-CM

## 2015-07-21 DIAGNOSIS — Z79899 Other long term (current) drug therapy: Secondary | ICD-10-CM | POA: Diagnosis not present

## 2015-07-21 DIAGNOSIS — Z87891 Personal history of nicotine dependence: Secondary | ICD-10-CM | POA: Insufficient documentation

## 2015-07-21 HISTORY — DX: Presence of automatic (implantable) cardiac defibrillator: Z95.810

## 2015-07-21 HISTORY — PX: EP IMPLANTABLE DEVICE: SHX172B

## 2015-07-21 LAB — SURGICAL PCR SCREEN
MRSA, PCR: NEGATIVE
Staphylococcus aureus: NEGATIVE

## 2015-07-21 SURGERY — BIV ICD INSERTION CRT-D
Anesthesia: LOCAL

## 2015-07-21 MED ORDER — CEFAZOLIN SODIUM-DEXTROSE 2-3 GM-% IV SOLR
INTRAVENOUS | Status: AC
  Filled 2015-07-21: qty 50

## 2015-07-21 MED ORDER — ACETAMINOPHEN 325 MG PO TABS
325.0000 mg | ORAL_TABLET | ORAL | Status: DC | PRN
  Administered 2015-07-21 – 2015-07-22 (×3): 650 mg via ORAL
  Filled 2015-07-21 (×4): qty 2

## 2015-07-21 MED ORDER — FUROSEMIDE 40 MG PO TABS: 40.0000 mg | ORAL_TABLET | ORAL | Status: DC | PRN

## 2015-07-21 MED ORDER — FENTANYL CITRATE (PF) 100 MCG/2ML IJ SOLN
INTRAMUSCULAR | Status: AC
  Filled 2015-07-21: qty 4

## 2015-07-21 MED ORDER — CARVEDILOL 6.25 MG PO TABS
6.2500 mg | ORAL_TABLET | Freq: Two times a day (BID) | ORAL | Status: DC
  Administered 2015-07-21 – 2015-07-22 (×2): 6.25 mg via ORAL
  Filled 2015-07-21 (×4): qty 1

## 2015-07-21 MED ORDER — CEFAZOLIN SODIUM 1-5 GM-% IV SOLN
1.0000 g | Freq: Four times a day (QID) | INTRAVENOUS | Status: AC
  Administered 2015-07-21 – 2015-07-22 (×3): 1 g via INTRAVENOUS
  Filled 2015-07-21 (×3): qty 50

## 2015-07-21 MED ORDER — SACUBITRIL-VALSARTAN 24-26 MG PO TABS
1.0000 | ORAL_TABLET | Freq: Two times a day (BID) | ORAL | Status: DC
  Administered 2015-07-21: 1 via ORAL
  Filled 2015-07-21 (×3): qty 1

## 2015-07-21 MED ORDER — PNEUMOCOCCAL VAC POLYVALENT 25 MCG/0.5ML IJ INJ
0.5000 mL | INJECTION | INTRAMUSCULAR | Status: DC
  Filled 2015-07-21: qty 0.5

## 2015-07-21 MED ORDER — LIDOCAINE HCL (PF) 1 % IJ SOLN
INTRAMUSCULAR | Status: AC
  Filled 2015-07-21: qty 30

## 2015-07-21 MED ORDER — ONDANSETRON HCL 4 MG/2ML IJ SOLN: 4.0000 mg | Freq: Four times a day (QID) | INTRAMUSCULAR | Status: DC | PRN

## 2015-07-21 MED ORDER — SPIRONOLACTONE 25 MG PO TABS
25.0000 mg | ORAL_TABLET | Freq: Every day | ORAL | Status: DC
  Filled 2015-07-21: qty 1

## 2015-07-21 MED ORDER — SODIUM CHLORIDE 0.9 % IV SOLN: INTRAVENOUS | Status: DC

## 2015-07-21 MED ORDER — YOU HAVE A PACEMAKER BOOK
Freq: Once | Status: AC
  Administered 2015-07-21: 14:00:00
  Filled 2015-07-21 (×2): qty 1

## 2015-07-21 MED ORDER — FENTANYL CITRATE (PF) 100 MCG/2ML IJ SOLN
INTRAMUSCULAR | Status: AC
  Filled 2015-07-21: qty 2

## 2015-07-21 MED ORDER — MIDAZOLAM HCL 5 MG/5ML IJ SOLN
INTRAMUSCULAR | Status: AC
  Filled 2015-07-21: qty 25

## 2015-07-21 MED ORDER — HEPARIN (PORCINE) IN NACL 2-0.9 UNIT/ML-% IJ SOLN
INTRAMUSCULAR | Status: AC
  Filled 2015-07-21: qty 500

## 2015-07-21 MED ORDER — MUPIROCIN 2 % EX OINT
TOPICAL_OINTMENT | CUTANEOUS | Status: AC
  Administered 2015-07-21: 1 via TOPICAL
  Filled 2015-07-21: qty 22

## 2015-07-21 MED ORDER — SODIUM CHLORIDE 0.9 % IV SOLN
INTRAVENOUS | Status: AC
  Administered 2015-07-21: 14:00:00 via INTRAVENOUS

## 2015-07-21 MED ORDER — GENTAMICIN SULFATE 40 MG/ML IJ SOLN
INTRAMUSCULAR | Status: DC | PRN
  Administered 2015-07-21: 13:00:00

## 2015-07-21 MED ORDER — HEPARIN (PORCINE) IN NACL 2-0.9 UNIT/ML-% IJ SOLN
INTRAMUSCULAR | Status: DC | PRN
  Administered 2015-07-21: 12:00:00

## 2015-07-21 MED ORDER — MIDAZOLAM HCL 5 MG/5ML IJ SOLN
INTRAMUSCULAR | Status: DC | PRN
  Administered 2015-07-21 (×2): 1 mg via INTRAVENOUS
  Administered 2015-07-21: 2 mg via INTRAVENOUS
  Administered 2015-07-21: 1 mg via INTRAVENOUS

## 2015-07-21 MED ORDER — SODIUM CHLORIDE 0.9 % IR SOLN
Status: AC
  Filled 2015-07-21: qty 2

## 2015-07-21 MED ORDER — MUPIROCIN 2 % EX OINT
1.0000 "application " | TOPICAL_OINTMENT | Freq: Once | CUTANEOUS | Status: AC
  Administered 2015-07-21: 1 via TOPICAL
  Filled 2015-07-21: qty 22

## 2015-07-21 MED ORDER — MIDAZOLAM HCL 5 MG/5ML IJ SOLN
INTRAMUSCULAR | Status: AC
  Filled 2015-07-21: qty 5

## 2015-07-21 MED ORDER — CEFAZOLIN SODIUM-DEXTROSE 2-3 GM-% IV SOLR
2.0000 g | INTRAVENOUS | Status: AC
  Administered 2015-07-21: 2 g via INTRAVENOUS

## 2015-07-21 MED ORDER — FENTANYL CITRATE (PF) 100 MCG/2ML IJ SOLN
INTRAMUSCULAR | Status: DC | PRN
  Administered 2015-07-21 (×2): 25 ug via INTRAVENOUS
  Administered 2015-07-21: 50 ug via INTRAVENOUS

## 2015-07-21 MED ORDER — LIDOCAINE HCL (PF) 1 % IJ SOLN
INTRAMUSCULAR | Status: DC | PRN
  Administered 2015-07-21: 60 mL via SUBCUTANEOUS

## 2015-07-21 MED ORDER — CHLORHEXIDINE GLUCONATE 4 % EX LIQD
60.0000 mL | Freq: Once | CUTANEOUS | Status: DC
  Filled 2015-07-21: qty 60

## 2015-07-21 MED ORDER — GENTAMICIN SULFATE 40 MG/ML IJ SOLN
80.0000 mg | INTRAMUSCULAR | Status: DC
  Filled 2015-07-21: qty 2

## 2015-07-21 MED ORDER — DIGOXIN 125 MCG PO TABS
0.1250 mg | ORAL_TABLET | Freq: Every day | ORAL | Status: DC
  Filled 2015-07-21: qty 1

## 2015-07-21 MED ORDER — SODIUM CHLORIDE 0.9 % IV SOLN
INTRAVENOUS | Status: DC
Start: 2015-07-21 — End: 2015-07-21
  Administered 2015-07-21: 10:00:00 via INTRAVENOUS
  Administered 2015-07-21: 250 mL via INTRAVENOUS
  Administered 2015-07-21: 10:00:00 via INTRAVENOUS

## 2015-07-21 SURGICAL SUPPLY — 19 items
ASSURA CRTD CD3369-40C (ICD Generator) ×2 IMPLANT
CABLE SURGICAL S-101-97-12 (CABLE) ×2 IMPLANT
CATH CPS DIRECT 135 DS2C020 (CATHETERS) ×4 IMPLANT
DEFIB ASSURA CRT-D (ICD Generator) ×1 IMPLANT
KIT ESSENTIALS PG (KITS) ×2 IMPLANT
LEAD DURATA 7122-65CM (Lead) ×2 IMPLANT
LEAD QUARTET 1458QL-86 (Lead) ×1 IMPLANT
LEAD TENDRIL SDX 1688TC-52CM (Lead) ×2 IMPLANT
PAD DEFIB LIFELINK (PAD) ×2 IMPLANT
QUARTET 1458QL-86 (Lead) ×2 IMPLANT
SHEATH CLASSIC 7F (SHEATH) ×2 IMPLANT
SHEATH CLASSIC 8F (SHEATH) ×2 IMPLANT
SHEATH CLASSIC 9.5F (SHEATH) ×2 IMPLANT
SHIELD RADPAD SCOOP 12X17 (MISCELLANEOUS) ×2 IMPLANT
SLITTER 6232ADJ (MISCELLANEOUS) ×2 IMPLANT
SLITTER UNIVERSAL DS2A003 (MISCELLANEOUS) ×4 IMPLANT
TRAY PACEMAKER INSERTION (CUSTOM PROCEDURE TRAY) ×2 IMPLANT
WIRE ACUITY WHISPER EDS 4648 (WIRE) ×4 IMPLANT
WIRE HI TORQ VERSACORE-J 145CM (WIRE) ×2 IMPLANT

## 2015-07-21 NOTE — Interval H&P Note (Signed)
ICD Criteria  Current LVEF:20%. Within 12 months prior to implant: Yes   Heart failure history: Yes, Class III  Cardiomyopathy history: Yes, Non-Ischemic Cardiomyopathy.  Atrial Fibrillation/Atrial Flutter: No.  Ventricular tachycardia history: No.  Cardiac arrest history: No.  History of syndromes with risk of sudden death: No.  Previous ICD: No.  Current ICD indication: Primary  PPM indication: No.  Beta Blocker therapy for 3 or more months: Yes, prescribed.   Ace Inhibitor/ARB therapy for 3 or more months: Yes, prescribed.   History and Physical Interval Note:  07/21/2015 1:20 PM  Isaiah Wu  has presented today for surgery, with the diagnosis of Left Bundle Branch Block  The various methods of treatment have been discussed with the patient and family. After consideration of risks, benefits and other options for treatment, the patient has consented to  Procedure(s): BiV ICD Insertion CRT-D (N/A) as a surgical intervention .  The patient's history has been reviewed, patient examined, no change in status, stable for surgery.  I have reviewed the patient's chart and labs.  Questions were answered to the patient's satisfaction.     Virl Axe

## 2015-07-21 NOTE — H&P (View-Only) (Signed)
ELECTROPHYSIOLOGY CONSULT NOTE  Patient ID: Isaiah Wu, MRN: [id number], DOB/AGE: 07-16-1951 64 y.o. Admit date: (Not on file) Date of Consult: 07/18/2015  Primary Physician: Gennette Pac, MD Primary Cardiologist: WST/CHF Chief Complaint: ICD    HPI Isaiah Wu is a 64 y.o. male  Referred for consideration of an ICD.  He had been well until April 2016 when he presented with the sudden onset of congestive symptoms. He underwent catheterization demonstrating an LVEF of about 15%. 4/16. He has been treated with guideline directed medical therapy with a marked attenuation of congestive symptoms; he has"mostly with fatigue.  Cardiac MR 5/16 demonstrated severe LV dysfunction-14% with moderate MR and a nonspecific LGA pattern suggestive but not specfic  of myocarditis  He denies chest pain nocturnal dyspnea or edema; he has not had palpitations. He had an episode of syncope a few weeks ago. He stood up to go from his chair to the kitchen and collapsed. He took some time on the floor prior to standing. It was felt to be orthostatic in nature. He has not had other orthostatic lightheadedness.        Past Medical History  Diagnosis Date  . Cardiomyopathy 04/07/2015  . Hypertension   . LBBB (left bundle branch block)   . GERD (gastroesophageal reflux disease)       Surgical History:  Past Surgical History  Procedure Laterality Date  . Tonsillectomy    . Shoulder surgery Left   . Orif elbow fracture    . Left and right heart catheterization with coronary angiogram N/A 04/09/2015    Procedure: LEFT AND RIGHT HEART CATHETERIZATION WITH CORONARY ANGIOGRAM;  Surgeon: Jacolyn Reedy, MD;  Location: [facility] CATH LAB;  Service: Cardiovascular;  Laterality: N/A;     Home Meds: Prior to Admission medications   Medication Sig Start Date End Date Taking? Authorizing Provider  carvedilol (COREG) 3.125 MG tablet Take 2 tablets (6.25 mg total) by mouth 2 (two) times daily. 07/11/15  Yes  Jolaine Artist, MD  digoxin (LANOXIN) 0.125 MG tablet Take 1 tablet (0.125 mg total) by mouth daily. 04/10/15  Yes Jacolyn Reedy, MD  furosemide (LASIX) 40 MG tablet Take 1 tablet (40 mg total) by mouth as needed (for weight 198 lb or greater). 07/11/15  Yes Shaune Pascal Bensimhon, MD  sacubitril-valsartan (ENTRESTO) 24-26 MG Take 1 tablet by mouth 2 (two) times daily. 04/29/15  Yes Larey Dresser, MD  spironolactone (ALDACTONE) 25 MG tablet Take 1 tablet (25 mg total) by mouth daily. 04/10/15  Yes Jacolyn Reedy, MD    I  Allergies: No Known Allergies  History   Social History  . Marital Status: Married    Spouse Name: N/A  . Number of Children: N/A  . Years of Education: N/A   Occupational History  . Not on file.   Social History Main Topics  . Smoking status: Former Research scientist (life sciences)  . Smokeless tobacco: Former Systems developer  . Alcohol Use: 0.0 oz/week    0 Standard drinks or equivalent per week     Comment: one glass of wine daily   . Drug Use: No  . Sexual Activity: Yes   Other Topics Concern  . Not on file   Social History Narrative     Family History  Problem Relation Age of Onset  . Bone cancer Father   . Diabetes Father   . CVA Mother      ROS:  Please see the history of present illness.  All other systems reviewed and negative.    Physical Exam:   Blood pressure 94/56, pulse 66, height 6\' 1"  (1.854 m), weight 202 lb 3.2 oz (91.717 kg). General: Well developed, well nourished male in no acute distress. Head: Normocephalic, atraumatic, sclera non-icteric, no xanthomas, nares are without discharge. EENT: normal Lymph Nodes:  none Back: without scoliosis/kyphosis  no CVA tendersness Neck: Negative for carotid bruits. JVD not elevated. Lungs: Clear bilaterally to auscultation without wheezes, rales, or rhonchi. Breathing is unlabored. Heart: RRR with S1 S2.  2/6 systolic murmur; dyskinetic PMI, rubs, or gallops appreciated. Abdomen: Soft, non-tender, non-distended with  normoactive bowel sounds. No hepatomegaly. No rebound/guarding. No obvious abdominal masses. Msk:  Strength and tone appear normal for age. Extremities: No clubbing or cyanosis. No edema.  Distal pedal pulses are 2+ and equal bilaterally. Skin: Warm and Dry Neuro: Alert and oriented X 3. CN III-XII intact Grossly normal sensory and motor function . Psych:  Responds to questions appropriately with a normal affect.      Labs: Cardiac Enzymes No results for input(s): CKTOTAL, CKMB, TROPONINI in the last 72 hours. CBC Lab Results  Component Value Date   WBC 5.9 05/30/2015   HGB 17.1* 05/30/2015   HCT 48.1 05/30/2015   MCV 85.0 05/30/2015   PLT 291 05/30/2015   PROTIME: No results for input(s): LABPROT, INR in the last 72 hours. Chemistry No results for input(s): NA, K, CL, CO2, BUN, CREATININE, CALCIUM, PROT, BILITOT, ALKPHOS, ALT, AST, GLUCOSE in the last 168 hours.  Invalid input(s): LABALBU Lipids No results found for: CHOL, HDL, LDLCALC, TRIG BNP No results found for: PROBNP Thyroid Function Tests: No results for input(s): TSH, T4TOTAL, T3FREE, THYROIDAB in the last 72 hours.  Invalid input(s): FREET3    Miscellaneous No results found for: DDIMER  Radiology/Studies:  No results found.  EKG: NSR  66 20/18/43 lbbb   Assessment and Plan:   NON iscxhemic cardiomyopathy  LBBB  Syncope  CHF  chroninc systolic  The pt has persistent LV dysfunction presumably attributable to myocarditis, or some other diffuse injury based on MRI,  He is appropriately considered for CRT-D for risk reduction of sudden death and hopeful improvement in functional status  His syncopal episode while possibly orthostatic is worrsiome nonetheless  We discussed the physiology and concerns of his NICM and CHF  Have reviewed the potential benefits and risks of ICD implantation including but not limited to death, perforation of heart or lung, lead dislodgement, infection,  device  malfunction and inappropriate shocks.  The patient and familyexpress understanding  and are willing to proceed.    Continue current meds      Virl Axe

## 2015-07-22 ENCOUNTER — Ambulatory Visit (HOSPITAL_COMMUNITY): Payer: BLUE CROSS/BLUE SHIELD

## 2015-07-22 ENCOUNTER — Encounter: Payer: Self-pay | Admitting: Nurse Practitioner

## 2015-07-22 DIAGNOSIS — I429 Cardiomyopathy, unspecified: Secondary | ICD-10-CM | POA: Diagnosis not present

## 2015-07-22 DIAGNOSIS — I447 Left bundle-branch block, unspecified: Secondary | ICD-10-CM | POA: Diagnosis not present

## 2015-07-22 DIAGNOSIS — I509 Heart failure, unspecified: Secondary | ICD-10-CM | POA: Diagnosis not present

## 2015-07-22 DIAGNOSIS — I10 Essential (primary) hypertension: Secondary | ICD-10-CM | POA: Diagnosis not present

## 2015-07-22 MED FILL — Lidocaine HCl Local Preservative Free (PF) Inj 1%: INTRAMUSCULAR | Qty: 30 | Status: AC

## 2015-07-22 NOTE — Discharge Summary (Signed)
ELECTROPHYSIOLOGY PROCEDURE DISCHARGE SUMMARY    Patient ID: Isaiah Wu,  MRN: [id number], DOB/AGE: 1951/05/05 64 y.o.  Admit date: 07/21/2015 Discharge date: 07/22/2015  Primary Care Physician: Gennette Pac, MD Primary Cardiologist: [name] Advanced Heart Failure: B[place]imhon Electrophysiologist: Caryl Comes  Primary Discharge Diagnosis:  Non ischemic cardiomyopathy, congestive heart failure, LBBB status post CRTD implant this admission  Secondary Discharge Diagnosis:  1.  Hypertension 2.  GERD  No Known Allergies   Procedures This Admission:  1.  Implantation of a STJ CRTD on 07/21/15 by Dr Caryl Comes.  See op note for full details.  DFT's were deferred at time of implant.  There were no immediate post procedure complications. 2.  CXR on 07/22/15 demonstrated no pneumothorax status post device implantation.   Brief HPI: Isaiah Wu is a 64 y.o. male was referred to electrophysiology in the outpatient setting for consideration of CRTD implantation.  Past medical history includes non ischemic cardiomyopathy, congestive heart failure, and LBBB.  The patient has persistent LV dysfunction despite guideline directed therapy.  Risks, benefits, and alternatives to CRTD implantation were reviewed with the patient who wished to proceed.   Hospital Course:  The patient was admitted and underwent implantation of a STJ CRTD with details as outlined above. He was monitored on telemetry overnight which demonstrated sinus rhythm with V pacing.  Left chest was without hematoma or ecchymosis.  The device was interrogated and found to be functioning normally.  CXR was obtained and demonstrated no pneumothorax status post device implantation.  Wound care, arm mobility, and restrictions were reviewed with the patient.  The patient was examined and considered stable for discharge to home.   The patient's discharge medications include an ARB (Valsartain) and beta blocker (Coreg).   Physical Exam: Filed  Vitals:   07/21/15 1959 07/21/15 2337 07/22/15 0100 07/22/15 0423  BP: 107/63 105/58  123/72  Pulse: 76 74  76  Temp: 98.4 F (36.9 C) 98.2 F (36.8 C)  97.7 F (36.5 C)  TempSrc: Oral Oral  Oral  Resp: 15 16  19   Height:      Weight:   198 lb 3.1 oz (89.9 kg)   SpO2: 97% 98%  96%    GEN- The patient is well appearing, alert and oriented x 3 today.   HEENT: normocephalic, atraumatic; sclera clear, conjunctiva pink; hearing intact; oropharynx clear; neck supple, no JVP Lungs- Clear to ausculation bilaterally, normal work of breathing.  No wheezes, rales, rhonchi Heart- Regular rate and rhythm (paced) GI- soft, non-tender, non-distended, bowel sounds present  Extremities- no clubbing, cyanosis, or edema  MS- no significant deformity or atrophy Skin- warm and dry, no rash or lesion, left chest without hematoma/ecchymosis Psych- euthymic mood, full affect Neuro- strength and sensation are intact   Labs:   Lab Results  Component Value Date   WBC 7.2 07/18/2015   HGB 15.6 07/18/2015   HCT 46.4 07/18/2015   MCV 89.5 07/18/2015   PLT 279.0 07/18/2015     Recent Labs Lab 07/18/15 1131  NA 139  K 4.1  CL 106  CO2 24  BUN 18  CREATININE 0.99  CALCIUM 9.7  GLUCOSE 98    Discharge Medications:    Medication List    TAKE these medications        carvedilol 3.125 MG tablet  Commonly known as:  COREG  Take 2 tablets (6.25 mg total) by mouth 2 (two) times daily.     digoxin 0.125 MG tablet  Commonly known as:  LANOXIN  Take 1 tablet (0.125 mg total) by mouth daily.     furosemide 40 MG tablet  Commonly known as:  LASIX  Take 1 tablet (40 mg total) by mouth as needed (for weight 198 lb or greater).     sacubitril-valsartan 24-26 MG  Commonly known as:  ENTRESTO  Take 1 tablet by mouth 2 (two) times daily.     spironolactone 25 MG tablet  Commonly known as:  ALDACTONE  Take 1 tablet (25 mg total) by mouth daily.        Disposition:  Discharge Instructions     Diet - low sodium heart healthy    Complete by:  As directed      Increase activity slowly    Complete by:  As directed           Follow-up Information    Follow up with [facility] On 07/30/2015.   Why:  at [facility] for wound check   Contact information:   [address] [place] [address]       Follow up with Glori Bickers, MD On 08/15/2015.   Specialty:  Cardiology   Why:  at 9:20AM   Contact information:   [place] [address] [phone]       Follow up with Patsey Berthold, NP On 09/03/2015.   Specialty:  Nurse Practitioner   Why:  at 1:40PM   Contact information:   [place] [address] [phone]       Follow up with Virl Axe, MD On 10/24/2015.   Specialty:  Cardiology   Why:  at 8:45AM   Contact information:   [address] [address] [phone]       Duration of Discharge Encounter: Greater than 30 minutes including physician time.  Signed, Chanetta Marshall, NP 07/22/2015 7:26 AM  EP Attending  Patient seen and examined. Agree with findings as noted by Chanetta Marshall, NP. He is s/p BiV ICD implant and feels well. [facility] for discharge with usual followup.   Mikle Bosworth.D.

## 2015-07-22 NOTE — Discharge Instructions (Signed)
° ° °  Supplemental Discharge Instructions for  Pacemaker/Defibrillator Patients  Activity No heavy lifting or vigorous activity with your left/right arm for 6 to 8 weeks.  Do not raise your left/right arm above your head for one week.  Gradually raise your affected arm as drawn below.           __    07/26/15                       07/27/15                      07/28/15                     07/29/15  NO DRIVING for 1 week     ; you may begin driving on  07/28/31   .  WOUND CARE - Keep the wound area clean and dry.  Do not get this area wet for one week. No showers for one week; you may shower on   07/29/15  . - The tape/steri-strips on your wound will fall off; do not pull them off.  No bandage is needed on the site.  DO  NOT apply any creams, oils, or ointments to the wound area. - If you notice any drainage or discharge from the wound, any swelling or bruising at the site, or you develop a fever > 101? F after you are discharged home, call the office at once.  Special Instructions - You are still able to use cellular telephones; use the ear opposite the side where you have your pacemaker/defibrillator.  Avoid carrying your cellular phone near your device. - When traveling through airports, show security personnel your identification card to avoid being screened in the metal detectors.  Ask the security personnel to use the hand wand. - Avoid arc welding equipment, MRI testing (magnetic resonance imaging), TENS units (transcutaneous nerve stimulators).  Call the office for questions about other devices. - Avoid electrical appliances that are in poor condition or are not properly grounded. - Microwave ovens are safe to be near or to operate.  Additional information for defibrillator patients should your device go off: - If your device goes off ONCE and you feel fine afterward, notify the device clinic nurses. - If your device goes off ONCE and you do not feel well afterward, call 911. - If your device  goes off TWICE, call 911. - If your device goes off THREE times in one day, call 911.  DO NOT DRIVE YOURSELF OR A FAMILY MEMBER WITH A DEFIBRILLATOR TO THE HOSPITAL--CALL 911.

## 2015-07-22 NOTE — Op Note (Signed)
NAME:  Isaiah Wu, Isaiah Wu NO.:  [id number]  MEDICAL RECORD NO.:  [id number]  LOCATION:  6C06C                        FACILITY:  [facility]  PHYSICIAN:  Deboraha Sprang, MD, FACCDATE OF BIRTH:  1951-10-30  DATE OF PROCEDURE:  07/21/2015 DATE OF DISCHARGE:                              OPERATIVE REPORT   PREOPERATIVE DIAGNOSES: 1. Nonischemic cardiomyopathy. 2. Congestive heart failure. 3. Left bundle-branch block.  POSTOPERATIVE DIAGNOSES: 1. Nonischemic cardiomyopathy. 2. Congestive heart failure. 3. Left bundle-branch block.  DESCRIPTION OF PROCEDURE:  Following obtaining informed consent, the patient was brought to the electrophysiology laboratory and placed on the fluoroscopic table in supine position.  After routine prep and drape of the left upper chest, lidocaine was infiltrated in the prepectoral subclavicular region.  An incision was made and carried down to layer of the prepectoral fascia using electrocautery and sharp dissection.  A pocket was formed similarly.  Hemostasis was obtained.  Thereafter, attention was turned to gain access to the extrathoracic left subclavian vein which was accomplished without difficulty without the aspiration of air or puncture of the artery.  Three separate venipunctures were accomplished.  Guidewires were placed and retained and sequentially 8-French, 9.5-French, and 7-French sheaths were placed, through which were passed a St. Jude 7122 single coil active fixation defibrillator lead, serial [id number], a St. Jude 135 coronary sinus cannulation catheter and a St. Jude 1688TC/52 cm active fixation atrial lead, serial #[id number].  The RV lead was deployed to the RV apex with bipolar R-wave of 14.0 with a pace impedance of 610, a threshold 2.2 V at 0.5 milliseconds shortly after screw deployment.  The current of injury was brisk and the current threshold was 3.5 mA.  This lead was secured to the prepectoral fascia.  We then  obtained access to the coronary sinus without difficulty.  A nonocclusive venogram demonstrated a low lateral branch and a high lateral branch.  We initially targeted the _________, deployed the lead and upon system removal, ended up inadvertently pulling the lead out as the splinter kinked in the sheath.  We then retained access through the previous sheath, deployed a new sheath and readdressed the coronary sinus now targeting the upper vein.  A St. Jude 1458QL lead, serial [id number] was deployed at the distal portion was at the junction between the mid and distal third.  In the 1-2 configuration, the bipolar L wave was 19.4, the pace impedance of 750, and a threshold of 0.6 at 0.5.  The QLV was about 105 or 10 milliseconds.  This lead was secured to the prepectoral fascia following removal of the deployment system, which was again in my hands associated with difficulty.  In between the first and second deployment of the CS lead, the right atrial lead was deployed to the right atrial appendage.  The bipolar P- wave was 4.2 with a pace impedance of 617 with threshold 1.4 V at 0.5 milliseconds, current threshold was 2.5 mA and there is no diaphragmatic pacing at 10 V.  The current of injury was brisk.  This lead was secured to the prepectoral fascia.  The pocket was then copiously irrigated with antibiotic containing saline solution, had to be enlarged a  little bit because of the large footprint of the device.  The leads were then attached to the pulse generator which was a [place], serial [id number].  Through the device, the bipolar R-wave was 12 with a pacing impedance of 680, threshold 0.5 at 0.5.  The P-wave was greater than 5 with a pacing impedance of 550, threshold 1.25 at 0.5, and the LV impedance in the 1-2 configuration was 1 V at 0.5 with impedance of 1050.  With these acceptable parameters recorded, the leads and the pulse generator were secured to the  prepectoral fascia.  The wound was then closed in 2 layers following irrigation.  The wound was washed, dried, and a Dermabond dressing was applied.  Needle counts, sponge counts, and instrument counts were correct at the end of the procedure according to the staff.  The patient tolerated the procedure without apparent complication.     Deboraha Sprang, MD, [facility]     SCK/MEDQ  D:  07/21/2015  T:  07/22/2015  Job:  [id number]

## 2015-07-24 ENCOUNTER — Telehealth: Payer: Self-pay | Admitting: Internal Medicine

## 2015-07-24 NOTE — Telephone Encounter (Signed)
New message      Pt had a pacemaker put in last Monday.  He said his chest has a "hiccup-like" episode last night.  Please advise

## 2015-07-24 NOTE — Telephone Encounter (Signed)
Spoke with patient- having positional diaphragmatic stim- denies pain. Advised that this is a benign finding, more of a nuisance. Will test various vectors at wound check 07-30-15. Instructed to call if the stim gets worse between now and 8-10 appt. Pt verbalizes understanding and appreciates call.

## 2015-07-28 ENCOUNTER — Other Ambulatory Visit (HOSPITAL_COMMUNITY): Payer: Self-pay

## 2015-07-28 ENCOUNTER — Telehealth (HOSPITAL_COMMUNITY): Payer: Self-pay

## 2015-07-28 MED ORDER — CARVEDILOL 6.25 MG PO TABS: 6.2500 mg | ORAL_TABLET | Freq: Two times a day (BID) | ORAL | Status: DC

## 2015-07-28 NOTE — Telephone Encounter (Signed)
Patient called c/o elevated BP from baseline this morning.  Patient readings 129/79, 128/89.  Usually runs 90-100s SBP up until today.  Not symptomatic, was able to do some yard work this morning without difficulty.  Weight stable, only change patient can attest for is he took a regular claritin yesterday.  Recently increased carvedilol 3.125 BID to 6.25 BID about 2 weeks ago, also had ICD placed 07/22/15 (no med changes made post op).  Will forward to MD for review.  [name]

## 2015-07-30 ENCOUNTER — Ambulatory Visit (INDEPENDENT_AMBULATORY_CARE_PROVIDER_SITE_OTHER): Payer: BLUE CROSS/BLUE SHIELD | Admitting: *Deleted

## 2015-07-30 ENCOUNTER — Encounter (HOSPITAL_COMMUNITY): Payer: Self-pay

## 2015-07-30 ENCOUNTER — Ambulatory Visit (HOSPITAL_COMMUNITY)
Admission: RE | Admit: 2015-07-30 | Discharge: 2015-07-30 | Disposition: A | Discharge status: Home / Self Care | Payer: BLUE CROSS/BLUE SHIELD | Source: Ambulatory Visit | Attending: Internal Medicine | Admitting: Internal Medicine

## 2015-07-30 DIAGNOSIS — I447 Left bundle-branch block, unspecified: Secondary | ICD-10-CM

## 2015-07-30 DIAGNOSIS — I5022 Chronic systolic (congestive) heart failure: Secondary | ICD-10-CM | POA: Diagnosis not present

## 2015-07-30 DIAGNOSIS — I429 Cardiomyopathy, unspecified: Secondary | ICD-10-CM | POA: Diagnosis not present

## 2015-07-30 DIAGNOSIS — Z95810 Presence of automatic (implantable) cardiac defibrillator: Secondary | ICD-10-CM | POA: Diagnosis present

## 2015-07-30 LAB — CUP PACEART INCLINIC DEVICE CHECK
Battery Remaining Longevity: 45.6 mo
Brady Statistic RA Percent Paced: 1.8 %
Brady Statistic RV Percent Paced: 98 %
Date Time Interrogation Session: 20160810132912
HighPow Impedance: 51.75 Ohm
Lead Channel Impedance Value: 387.5 Ohm
Lead Channel Impedance Value: 450 Ohm
Lead Channel Impedance Value: 837.5 Ohm
Lead Channel Pacing Threshold Amplitude: 0.5 V
Lead Channel Pacing Threshold Amplitude: 0.5 V
Lead Channel Pacing Threshold Amplitude: 1.25 V
Lead Channel Pacing Threshold Amplitude: 1.5 V
Lead Channel Pacing Threshold Amplitude: 2 V
Lead Channel Pacing Threshold Pulse Width: 0.5 ms
Lead Channel Pacing Threshold Pulse Width: 0.5 ms
Lead Channel Pacing Threshold Pulse Width: 0.5 ms
Lead Channel Pacing Threshold Pulse Width: 0.5 ms
Lead Channel Pacing Threshold Pulse Width: 0.5 ms
Lead Channel Sensing Intrinsic Amplitude: 12 mV
Lead Channel Sensing Intrinsic Amplitude: 5 mV
Lead Channel Setting Pacing Amplitude: 2.25 V
Lead Channel Setting Pacing Amplitude: 2.5 V
Lead Channel Setting Pacing Amplitude: 3.5 V
Lead Channel Setting Pacing Pulse Width: 0.5 ms
Lead Channel Setting Pacing Pulse Width: 0.5 ms
Lead Channel Setting Sensing Sensitivity: 0.5 mV
Pulse Gen Serial Number: 7290135
Zone Setting Detection Interval: 250 ms
Zone Setting Detection Interval: 280 ms
Zone Setting Detection Interval: 320 ms

## 2015-07-30 NOTE — Progress Notes (Signed)
Wound check appointment. Wound without redness or edema. Incision edges approximated, wound well healed. Normal device function. RA, RV, LV1 thresholds, sensing, and impedances consistent with implant measurements. Threshold increase noted in LV2- m3-p4 (5.0V @0 .23ms)---LV config reprogrammed to M2-RVc  (threshold: 2.0V @ 0.77ms, impedance 350ohms)---pt sent for CXR. Device programmed at 3.5V for extra safety margin until 3 month visit---LV1 output decreased to 2.5V due to d.stim felt at home, LV2 output is 3.0V. No recurrence of diaphragmatic stimulation in supine position during testing. Histogram distribution appropriate for patient and level of activity. No mode switches or ventricular arrhythmias noted. Patient educated about wound care, arm mobility, lifting restrictions, shock plan. ROV w/AS in 6 weeks and with SK in 3 months.

## 2015-08-01 ENCOUNTER — Telehealth (HOSPITAL_COMMUNITY): Payer: Self-pay

## 2015-08-01 NOTE — Telephone Encounter (Signed)
Rep with [employer] called to follow up on med rec request that was sent to our HF clinic.  Returned call and left VM stating that this was received and a packet was sent in the mail to his office 2 days ago 07/30/15 and should be there next week.  [name]

## 2015-08-01 NOTE — Progress Notes (Signed)
Record request completed for [employer] for patient. All records mailed to [facility] to provided address.  [name]

## 2015-08-05 ENCOUNTER — Encounter: Payer: Self-pay | Admitting: Nurse Practitioner

## 2015-08-05 ENCOUNTER — Encounter: Payer: Self-pay | Admitting: *Deleted

## 2015-08-15 ENCOUNTER — Encounter (HOSPITAL_COMMUNITY): Payer: Self-pay

## 2015-08-15 ENCOUNTER — Ambulatory Visit (HOSPITAL_COMMUNITY)
Admission: RE | Admit: 2015-08-15 | Discharge: 2015-08-15 | Disposition: A | Discharge status: Home / Self Care | Payer: BLUE CROSS/BLUE SHIELD | Source: Ambulatory Visit | Attending: Cardiology | Admitting: Cardiology

## 2015-08-15 VITALS — BP 110/68 | HR 84 | Wt 200.8 lb

## 2015-08-15 DIAGNOSIS — Z8249 Family history of ischemic heart disease and other diseases of the circulatory system: Secondary | ICD-10-CM | POA: Diagnosis not present

## 2015-08-15 DIAGNOSIS — K219 Gastro-esophageal reflux disease without esophagitis: Secondary | ICD-10-CM | POA: Insufficient documentation

## 2015-08-15 DIAGNOSIS — Z79899 Other long term (current) drug therapy: Secondary | ICD-10-CM | POA: Insufficient documentation

## 2015-08-15 DIAGNOSIS — I10 Essential (primary) hypertension: Secondary | ICD-10-CM | POA: Insufficient documentation

## 2015-08-15 DIAGNOSIS — Z87891 Personal history of nicotine dependence: Secondary | ICD-10-CM | POA: Insufficient documentation

## 2015-08-15 DIAGNOSIS — I5022 Chronic systolic (congestive) heart failure: Secondary | ICD-10-CM | POA: Diagnosis not present

## 2015-08-15 DIAGNOSIS — I447 Left bundle-branch block, unspecified: Secondary | ICD-10-CM | POA: Insufficient documentation

## 2015-08-15 DIAGNOSIS — I428 Other cardiomyopathies: Secondary | ICD-10-CM | POA: Diagnosis not present

## 2015-08-15 DIAGNOSIS — Z823 Family history of stroke: Secondary | ICD-10-CM | POA: Insufficient documentation

## 2015-08-15 MED ORDER — SACUBITRIL-VALSARTAN 49-51 MG PO TABS: 1.0000 | ORAL_TABLET | Freq: Two times a day (BID) | ORAL | Status: DC

## 2015-08-15 MED ORDER — POTASSIUM CHLORIDE CRYS ER 20 MEQ PO TBCR: 20.0000 meq | EXTENDED_RELEASE_TABLET | Freq: Every day | ORAL | Status: DC | PRN

## 2015-08-15 NOTE — Progress Notes (Signed)
Patient ID: Isaiah Wu, male   DOB: 07/08/1951, 64 y.o.   MRN: [id number]  PCP: Dr. Rex Kras  64 yo with minimal PMH diagnosed with systolic HF in 8/24.Echo showed EF 15% with diffuse hypokinesis.  Initially seen by Dr. [name]. He was admitted to [facility] in 4/16 for diuresis.  RHC/LHC showed elevated filling pressures and no significant coronary disease.  Underwent SJ BiV-ICD on 07/21/15  He returns for HF follow up.  At last visit carvedilol increased to 6.25 bid. Underwent SJ BiV-ICD on 07/21/15. Initially had some diaphragmatic pacing but this resolved. Says he seems to have more energy but gets tired by midday. Then feels better in the afternoon. Working 3 hours a day. Following BP and weights closely. Weight stable at 195.  If weight goes to 198 he takes lasix. SBP 110-125. Walking 2 miles per day throughout day on [employer].    Labs 4/16: TSH normal, SPEP negative, ferritin normal, HIV negative, K 4.6, creatinine 1.22, HCT 46.9 Labs 6/16: K 3.8, Creatinine 1.08 Labs 07/18/15: K 4.1, Creatinine 0.99  PMH: 1. HTN 2. GERD 3. LBBB 4. Cardiomyopathy: Nonischemic.  Echo (4/16) with EF 15%, severe diffuse hypokinesis [name]).  RHC/LHC (4/16) with normal coronaries; mean RA 10, PA 58/35 mean 48, mean PCWP 32, no comment on CO.  HIV negative, TSH normal, ferritin normal, SPEP normal. S/p St Jude CRT-D 07/21/15 5. CMRI  04/2015 -  Severely dilated LV with EF 14%. There was septal-lateral dyssynchrony (prominent).2. Normal RV size with mild to moderately decreased systolic function. At least moderate functional mitral regurgitation.On delayed enhancement imaging, there did appear to be some subtle late gadolinium enhancement (LGE). There was basal inferoseptal subepicardial LGE (small area) and apical septal subtle mid-wall LGE (small area) 6. Syncopal episode 6/16 - monitor ok. 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  No cardiomyopathy or sudden death.   ROS: All systems reviewed and negative except as per HPI.   Current Outpatient Prescriptions  Medication Sig Dispense Refill  . carvedilol (COREG) 6.25 MG tablet Take 1 tablet (6.25 mg total) by mouth 2 (two) times daily. 180 tablet 3  . digoxin (LANOXIN) 0.125 MG tablet Take 1 tablet (0.125 mg total) by mouth daily. 30 tablet 12  . furosemide (LASIX) 40 MG tablet Take 1 tablet (40 mg total) by mouth as needed (for weight 198 lb or greater). 30 tablet 12  . sacubitril-valsartan (ENTRESTO) 24-26 MG Take 1 tablet by mouth 2 (two) times daily. 60 tablet 3  . spironolactone (ALDACTONE) 25 MG tablet Take 1 tablet (25 mg total) by mouth daily. 30 tablet 12   No current facility-administered medications for this encounter.   BP 110/68 mmHg  Pulse 84  Wt 200 lb 12.8 oz (91.082 kg)  SpO2 97% General: NAD Neck: No JVD, no thyromegaly or thyroid nodule.  Lungs: Clear to auscultation bilaterally with normal respiratory effort. CV: Nondisplaced PMI.  Heart regular S1/S2, no S3/S4, no murmur. ICD site looks ok. No peripheral edema.  No carotid bruit.  Normal pedal pulses.  Abdomen: Soft, nontender, no hepatosplenomegaly, mild distention.  Skin: Intact without lesions or rashes.  Neurologic: Alert and oriented x 3.  Psych: Normal affect. Extremities: No clubbing or cyanosis.  HEENT: Normal.   Assessment/Plan:  1. Chronic systolic CHF: Nonischemic cardiomyopathy, EF 15%.Currently NYHA class II-early III. S/p St jude CRT-D 01[birth year] - Etiology uncertain: normal coronaries, no  symptoms suggestive of viral syndrome prior to admission, HIV/Ferritin/SPEP/TSH unremarkable.  No history of familial CMP.  He has LBBB of uncertain duration.  ? LBBB cardiomyopathy.  - Had CMRI 5/18--> EF 14% RV normal Mod MR  ? Myocarditis.  - Volume status stable. Take lasix to as needed for weight 198 or above. He is not volume overloaded.    - Continue spironolactone 25  mg daily  - Continue Coreg 6.25 mg bid.   - Increase Entresto 49/51 bid. BMET and dig level 1 week.  2. LBBB: Of uncertain duration.    Regarding work. Would continue 3/hrs per day now. F/u 1 month with echo. If symptoms and EF improving can consider increase to 5-6 hours per day at that time if tolerated.   Nyshawn Gowdy,MD 9:35 AM

## 2015-08-15 NOTE — Addendum Note (Signed)
Encounter addended by: Effie Berkshire, RN on: 08/15/2015 10:15 AM<BR>     Documentation filed: Medications, Orders, Patient Instructions Section

## 2015-08-15 NOTE — Addendum Note (Signed)
Encounter addended by: Harvie Junior, CMA on: 08/15/2015 10:30 AM<BR>     Documentation filed: Orders, Dx Association

## 2015-08-15 NOTE — Progress Notes (Signed)
Advanced Heart Failure Medication Review by a Pharmacist  Does the patient  feel that his/her medications are working for him/her?  yes  Has the patient been experiencing any side effects to the medications prescribed?  no  Does the patient measure his/her own blood pressure or blood glucose at home?  yes   Does the patient have any problems obtaining medications due to transportation or finances?   no  Understanding of regimen: excellent Understanding of indications: excellent Potential of compliance: excellent    Pharmacist comments:  Isaiah Wu is a pleasant 64 yo M presenting with his wife and an up-to-date medication list as well as a detailed daily vitals worksheet. His last dose of lasix was on 07/30/15 and since then has not required any lasix. He has an excellent understanding of his regimen and the importance of consistent use. He did ask about the effects of Claritin and whether or not this could cause an increase in blood pressure. I assured him that this is not usually a side effect of this medication and counseled him on the effects of combination OTC medications that include pseudoephedrine or phenylephrine which may have that effect. He did not have any other medication-related questions or concerns at this time.   Ruta Hinds. Velva Harman, PharmD, BCPS, CPP Clinical Pharmacist Pager: [phone] Phone: [phone] 08/15/2015 9:34 AM

## 2015-08-15 NOTE — Patient Instructions (Addendum)
INCREASE Entresto to 49/51 mg tablet twice daily.  Take 20 meq potassium tablet when you take Lasix as needed. Potassium Rx sent to Pharmacy: [facility] [place], [place] [Patient Preferred] [phone]  Will schedule you for an echocardiogram at [facility]. Address: [address] ([place]), [place], [place] [address]  Phone: [phone]  Return in 1-2 weeks for repeat lab work.  Follow up 1 month.  Do the following things EVERYDAY: 1) Weigh yourself in the morning before breakfast. Write it down and keep it in a log. 2) Take your medicines as prescribed 3) Eat low salt foods-Limit salt (sodium) to 2000 mg per day.  4) Stay as active as you can everyday 5) Limit all fluids for the day to less than 2 liters

## 2015-08-27 ENCOUNTER — Ambulatory Visit (HOSPITAL_COMMUNITY)
Admission: RE | Admit: 2015-08-27 | Discharge: 2015-08-27 | Disposition: A | Discharge status: Home / Self Care | Payer: BLUE CROSS/BLUE SHIELD | Source: Ambulatory Visit | Attending: Internal Medicine | Admitting: Internal Medicine

## 2015-08-27 DIAGNOSIS — I428 Other cardiomyopathies: Secondary | ICD-10-CM | POA: Diagnosis present

## 2015-08-27 DIAGNOSIS — I5022 Chronic systolic (congestive) heart failure: Secondary | ICD-10-CM | POA: Diagnosis not present

## 2015-08-27 LAB — BASIC METABOLIC PANEL
Anion gap: 8 (ref 5–15)
BUN: 15 mg/dL (ref 6–20)
CO2: 24 mmol/L (ref 22–32)
Calcium: 9.6 mg/dL (ref 8.9–10.3)
Chloride: 107 mmol/L (ref 101–111)
Creatinine, Ser: 1.03 mg/dL (ref 0.61–1.24)
GFR calc Af Amer: >60 mL/min (ref >60)
GFR calc non Af Amer: >60 mL/min (ref >60)
Glucose, Bld: 92 mg/dL (ref 65–99)
Potassium: 4.3 mmol/L (ref 3.5–5.1)
Sodium: 139 mmol/L (ref 135–145)

## 2015-08-27 LAB — DIGOXIN LEVEL: Digoxin Level: 0.3 ng/mL — ABNORMAL LOW (ref 0.8–2.0)

## 2015-09-02 NOTE — Progress Notes (Signed)
Electrophysiology Office Note Date: 09/03/2015  ID:  Isaiah Wu, DOB 22-Feb-1951, MRN [id number]  PCP: Gennette Pac, MD Primary Cardiologist: [name] AHF: Bensimhon Electrophysiologist: Caryl Comes  CC: CHF follow up - 6 week post CRT upgrade  Isaiah Wu is a 64 y.o. male seen today for Dr Caryl Comes.  He presents today for routine electrophysiology followup.  Since last being seen in our clinic, the patient reports doing very well. He denies chest pain, palpitations, PND, orthopnea, nausea, vomiting, dizziness, syncope, edema, weight gain, or early satiety.  He has not had ICD shocks. He does have fatigue mid-day that improves toward the end of the day.  He is working 3 hours/day but is unsure that he can increase that at this time.   Device History: STJ CRTD implanted 2016 for NICM, CHF, LBBB History of appropriate therapy: No History of AAD therapy: No   Past Medical History  Diagnosis Date  . Cardiomyopathy 04/07/2015  . Hypertension   . LBBB (left bundle branch block)   . GERD (gastroesophageal reflux disease)   . Chronic systolic CHF (congestive heart failure) 04/14/2015  . Syncope 05/30/2015  . AICD (automatic cardioverter/defibrillator) present    Past Surgical History  Procedure Laterality Date  . Shoulder surgery Left 05/2008    "had to put cadavear bone in and reattach muscle to it"  . Orif elbow fracture Left 1981  . Left and right heart catheterization with coronary angiogram N/A 04/09/2015    Procedure: LEFT AND RIGHT HEART CATHETERIZATION WITH CORONARY ANGIOGRAM;  Surgeon: Jacolyn Reedy, MD;  Location: [facility] CATH LAB;  Service: Cardiovascular;  Laterality: N/A;  . Ep implantable device N/A 07/21/2015    Procedure: BiV ICD Insertion CRT-D;  Surgeon: Deboraha Sprang, MD;  Location: [facility] CV LAB;  Service: Cardiovascular;  Laterality: N/A;  . Tonsillectomy and adenoidectomy  1960's  . Fracture surgery    . Cardiac catheterization      Current Outpatient  Prescriptions  Medication Sig Dispense Refill  . carvedilol (COREG) 6.25 MG tablet Take 1 tablet (6.25 mg total) by mouth 2 (two) times daily. 180 tablet 3  . digoxin (LANOXIN) 0.125 MG tablet Take 1 tablet (0.125 mg total) by mouth daily. 30 tablet 12  . furosemide (LASIX) 40 MG tablet Take 1 tablet (40 mg total) by mouth as needed (for weight 198 lb or greater). 30 tablet 12  . potassium chloride SA (KLOR-CON M20) 20 MEQ tablet Take 1 tablet (20 mEq total) by mouth daily as needed. To be taken only when you take Lasix. 30 tablet 3  . sacubitril-valsartan (ENTRESTO) 49-51 MG Take 1 tablet by mouth 2 (two) times daily. 60 tablet 6  . spironolactone (ALDACTONE) 25 MG tablet Take 1 tablet (25 mg total) by mouth daily. 30 tablet 12   No current facility-administered medications for this visit.    Allergies:   Review of patient's allergies indicates no known allergies.   Social History: Social History   Social History  . Marital Status: Married    Spouse Name: N/A  . Number of Children: N/A  . Years of Education: N/A   Occupational History  . Not on file.   Social History Main Topics  . Smoking status: Former Smoker -- 1.00 packs/day for 2 years    Types: Cigarettes  . Smokeless tobacco: Never Used     Comment: 'quit smoking in the 1970's"  . Alcohol Use: 8.4 oz/week    0 Standard drinks or equivalent, 14 Glasses of wine  per week     Comment: 07/21/2015 "1, 8oz glass of wine/day; I'm stopping now"  . Drug Use: No  . Sexual Activity: Yes   Other Topics Concern  . Not on file   Social History Narrative    Family History: Family History  Problem Relation Age of Onset  . Bone cancer Father   . Diabetes Father   . CVA Mother     Review of Systems: All other systems reviewed and are otherwise negative except as noted above.   Physical Exam: VS:  BP 116/64 mmHg  Pulse 51  Ht 6\' 1"  (1.854 m)  Wt 202 lb (91.627 kg)  BMI 26.66 kg/m2  SpO2 95% , BMI Body mass index is  26.66 kg/(m^2).  GEN- The patient is well appearing, alert and oriented x 3 today.   HEENT: normocephalic, atraumatic; sclera clear, conjunctiva pink; hearing intact; oropharynx clear; neck supple, no JVP Lymph- no cervical lymphadenopathy Lungs- Clear to ausculation bilaterally, normal work of breathing.  No wheezes, rales, rhonchi Heart- Regular rate and rhythm (paced) GI- soft, non-tender, non-distended, bowel sounds present  Extremities- no clubbing, cyanosis, or edema; DP/PT/radial pulses 2+ bilaterally MS- no significant deformity or atrophy Skin- warm and dry, no rash or lesion; ICD pocket well healed Psych- euthymic mood, full affect Neuro- strength and sensation are intact  ICD interrogation- reviewed in detail today,  See PACEART report  EKG:  EKG is not ordered today.  Recent Labs: 04/07/2015: ALT 33 04/09/2015: TSH 1.371 05/14/2015: B Natriuretic Peptide 363.7* 07/18/2015: Hemoglobin 15.6; Platelets 279.0 08/27/2015: BUN 15; Creatinine, Ser 1.03; Potassium 4.3; Sodium 139   Wt Readings from Last 3 Encounters:  09/03/15 202 lb (91.627 kg)  08/15/15 200 lb 12.8 oz (91.082 kg)  07/22/15 198 lb 3.1 oz (89.9 kg)     Other studies Reviewed: Additional studies/ records that were reviewed today include: hospital records, Dr Bensimhon's office notes  Assessment and Plan:  1.  Chronic systolic dysfunction euvolemic today Stable on an appropriate medical regimen Normal CRTD function See Pace Art report No changes today Repeat echo scheduled for the end of this month  2.  Sinus tachycardia The patient was slightly tachycardic today.  Histograms from last interrogation are reasonable. Will follow over time. If he has persistent sinus rates 80-90's, would consider addition of Corlanor  3.  HTN Stable No change required today    Current medicines are reviewed at length with the patient today.   The patient does not have concerns regarding his medicines.  The following  changes were made today:  none  Labs/ tests ordered today include: none   Disposition:   Follow up with Dr Caryl Comes in 6 weeks     Signed, Chanetta Marshall, NP 09/03/2015 2:44 PM  [facility] [address] [place] [address] [phone] (office) [phone] (fax

## 2015-09-03 ENCOUNTER — Encounter: Payer: Self-pay | Admitting: Nurse Practitioner

## 2015-09-03 ENCOUNTER — Ambulatory Visit (INDEPENDENT_AMBULATORY_CARE_PROVIDER_SITE_OTHER): Payer: BLUE CROSS/BLUE SHIELD | Admitting: Nurse Practitioner

## 2015-09-03 VITALS — BP 116/64 | HR 51 | Ht 73.0 in | Wt 202.0 lb

## 2015-09-03 DIAGNOSIS — I428 Other cardiomyopathies: Secondary | ICD-10-CM

## 2015-09-03 DIAGNOSIS — I429 Cardiomyopathy, unspecified: Secondary | ICD-10-CM

## 2015-09-03 DIAGNOSIS — I5022 Chronic systolic (congestive) heart failure: Secondary | ICD-10-CM

## 2015-09-03 DIAGNOSIS — R000 Tachycardia, unspecified: Secondary | ICD-10-CM

## 2015-09-03 DIAGNOSIS — I471 Supraventricular tachycardia: Secondary | ICD-10-CM

## 2015-09-03 DIAGNOSIS — I10 Essential (primary) hypertension: Secondary | ICD-10-CM

## 2015-09-03 NOTE — Patient Instructions (Signed)
Medication Instructions:   Your physician recommends that you continue on your current medications as directed. Please refer to the Current Medication list given to you today.   Testing/Procedures:  NONE ORDER TODAY    Follow-Up:  ALREADY SCHEDULED    Any Other Special Instructions Will Be Listed Below (If Applicable).

## 2015-09-09 ENCOUNTER — Encounter: Payer: Self-pay | Admitting: Internal Medicine

## 2015-09-15 LAB — CUP PACEART INCLINIC DEVICE CHECK
Date Time Interrogation Session: 20160926073216
Pulse Gen Serial Number: 7290135

## 2015-09-19 ENCOUNTER — Ambulatory Visit (HOSPITAL_COMMUNITY): Payer: BLUE CROSS/BLUE SHIELD | Attending: Cardiology

## 2015-09-19 ENCOUNTER — Other Ambulatory Visit: Payer: Self-pay

## 2015-09-19 DIAGNOSIS — I517 Cardiomegaly: Secondary | ICD-10-CM | POA: Diagnosis not present

## 2015-09-19 DIAGNOSIS — I5022 Chronic systolic (congestive) heart failure: Secondary | ICD-10-CM

## 2015-09-19 DIAGNOSIS — I10 Essential (primary) hypertension: Secondary | ICD-10-CM | POA: Insufficient documentation

## 2015-09-19 DIAGNOSIS — I509 Heart failure, unspecified: Secondary | ICD-10-CM | POA: Insufficient documentation

## 2015-09-19 DIAGNOSIS — R29898 Other symptoms and signs involving the musculoskeletal system: Secondary | ICD-10-CM | POA: Diagnosis not present

## 2015-09-19 DIAGNOSIS — I340 Nonrheumatic mitral (valve) insufficiency: Secondary | ICD-10-CM | POA: Diagnosis not present

## 2015-09-19 DIAGNOSIS — Z87891 Personal history of nicotine dependence: Secondary | ICD-10-CM | POA: Diagnosis not present

## 2015-09-19 DIAGNOSIS — I447 Left bundle-branch block, unspecified: Secondary | ICD-10-CM | POA: Insufficient documentation

## 2015-09-22 ENCOUNTER — Encounter: Payer: Self-pay | Admitting: Internal Medicine

## 2015-09-22 ENCOUNTER — Ambulatory Visit (HOSPITAL_COMMUNITY)
Admission: RE | Admit: 2015-09-22 | Discharge: 2015-09-22 | Disposition: A | Discharge status: Home / Self Care | Payer: BLUE CROSS/BLUE SHIELD | Source: Ambulatory Visit | Attending: Cardiology | Admitting: Cardiology

## 2015-09-22 VITALS — BP 92/56 | HR 74 | Wt 201.5 lb

## 2015-09-22 DIAGNOSIS — Z79899 Other long term (current) drug therapy: Secondary | ICD-10-CM | POA: Diagnosis not present

## 2015-09-22 DIAGNOSIS — I5022 Chronic systolic (congestive) heart failure: Secondary | ICD-10-CM | POA: Diagnosis not present

## 2015-09-22 DIAGNOSIS — Z87891 Personal history of nicotine dependence: Secondary | ICD-10-CM | POA: Insufficient documentation

## 2015-09-22 DIAGNOSIS — I447 Left bundle-branch block, unspecified: Secondary | ICD-10-CM | POA: Diagnosis not present

## 2015-09-22 DIAGNOSIS — K219 Gastro-esophageal reflux disease without esophagitis: Secondary | ICD-10-CM | POA: Diagnosis not present

## 2015-09-22 DIAGNOSIS — Z8249 Family history of ischemic heart disease and other diseases of the circulatory system: Secondary | ICD-10-CM | POA: Insufficient documentation

## 2015-09-22 DIAGNOSIS — I429 Cardiomyopathy, unspecified: Secondary | ICD-10-CM

## 2015-09-22 DIAGNOSIS — Z823 Family history of stroke: Secondary | ICD-10-CM | POA: Insufficient documentation

## 2015-09-22 DIAGNOSIS — I428 Other cardiomyopathies: Secondary | ICD-10-CM | POA: Diagnosis not present

## 2015-09-22 DIAGNOSIS — I10 Essential (primary) hypertension: Secondary | ICD-10-CM | POA: Diagnosis not present

## 2015-09-22 NOTE — Progress Notes (Signed)
Patient ID: Isaiah Wu, male   DOB: October 31, 1951, 63 y.o.   MRN: [id number]  PCP: Dr. Rex Wu  64 yo with minimal PMH diagnosed with systolic HF in 4/09.Echo showed EF 15% with diffuse hypokinesis.  Initially seen by Dr. Wynonia Wu. He was admitted to [facility] in 4/16 for diuresis.  RHC/LHC showed elevated filling pressures and no significant coronary disease.  Underwent St Jude BiV-ICD on 07/21/15  At last visit, Isaiah Wu was added.  SBP running 90s-110s since then.  If he stands fast, he is lightheaded.  Generally, however, he seems to be tolerating it reasonably well.  No significant exertional dyspnea.  No chest pain.  No orthopnea/PND.  He lost his job since last appointment and wants to know what kind of hours he can look for in a new job.   Labs 4/16: TSH normal, SPEP negative, ferritin normal, HIV negative, K 4.6, creatinine 1.22, HCT 46.9 Labs 6/16: K 3.8, Creatinine 1.08 Labs 07/18/15: K 4.1, Creatinine 0.99 Labs 9/16: digoxin 0.3, K 4.3, creatinine 1.03  PMH: 1. HTN 2. GERD 3. LBBB 4. Cardiomyopathy: Nonischemic.  Echo (4/16) with EF 15%, severe diffuse hypokinesis Isaiah Wu).  RHC/LHC (4/16) with normal coronaries; mean RA 10, PA 58/35 mean 48, mean PCWP 32, no comment on CO.  HIV negative, TSH normal, ferritin normal, SPEP normal. S/p St Jude CRT-D 07/21/15 cMRI  04/2015 with severely dilated LV, EF 14%, septal-lateral dyssynchrony (prominent), normal RV size with mild to moderately decreased systolic function, at least moderate functional mitral regurgitation, there was basal inferoseptal subepicardial LGE (small area) and apical septal subtle mid-wall LGE (small area).  Echo (9/16) with EF 25-30%, mild MR => improved. 5. Syncopal episode 6/16: Monitor ok. Suspect orthostasis.    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  No cardiomyopathy or sudden death.   ROS: All systems reviewed and  negative except as per HPI.   Current Outpatient Prescriptions  Medication Sig Dispense Refill  . carvedilol (COREG) 6.25 MG tablet Take 1 tablet (6.25 mg total) by mouth 2 (two) times daily. 180 tablet 3  . digoxin (LANOXIN) 0.125 MG tablet Take 1 tablet (0.125 mg total) by mouth daily. 30 tablet 12  . furosemide (LASIX) 40 MG tablet Take 1 tablet (40 mg total) by mouth as needed (for weight 198 lb or greater). 30 tablet 12  . potassium chloride SA (KLOR-CON M20) 20 MEQ tablet Take 1 tablet (20 mEq total) by mouth daily as needed. To be taken only when you take Lasix. 30 tablet 3  . sacubitril-valsartan (ENTRESTO) 49-51 MG Take 1 tablet by mouth 2 (two) times daily. 60 tablet 6  . spironolactone (ALDACTONE) 25 MG tablet Take 1 tablet (25 mg total) by mouth daily. 30 tablet 12   No current facility-administered medications for this encounter.   BP 92/56 mmHg  Pulse 74  Wt 201 lb 8 oz (91.4 kg)  SpO2 98% General: NAD Neck: No JVD, no thyromegaly or thyroid nodule.  Lungs: Clear to auscultation bilaterally with normal respiratory effort. CV: Nondisplaced PMI.  Heart regular S1/S2, no S3/S4, no murmur. No peripheral edema.  No carotid bruit.  Normal pedal pulses.  Abdomen: Soft, nontender, no hepatosplenomegaly, mild distention.  Skin: Intact without lesions or rashes.  Neurologic: Alert and oriented x 3.  Psych: Normal affect. Extremities: No clubbing or cyanosis.  HEENT: Normal.   Assessment/Plan:  1. Chronic systolic CHF:  Nonischemic cardiomyopathy, EF 15% initially, up to 25-30% with St Jude CRT-D. Currently NYHA class II without volume overload.  Etiology uncertain: normal coronaries, no symptoms suggestive of viral syndrome prior to admission, HIV/Ferritin/SPEP/TSH unremarkable.  No history of familial CMP.  He has LBBB of uncertain duration.  ? LBBB cardiomyopathy.  However, there was also mid-wall LGE in the septum on cardiac MRI, suggesting possibility of myocarditis.  - Continue  Lasix prn.    - Continue spironolactone 25 mg daily  - Continue Coreg 6.25 mg bid.   - Continue Entresto 49/51 bid.  - I will not titrate meds today with soft BP.  - Repeat echo in 6 months (3/17) to see if EF has improved further.  - I think he can look for part-time work for 5-6 hours/day. Eventually could move up to full time if he continues to do well.  2. LBBB: Of uncertain duration.    Isaiah Herrman,MD 09/22/2015

## 2015-09-22 NOTE — Patient Instructions (Signed)
Your physician has requested that you regularly monitor and record your blood pressure readings at home. Please use the same machine at the same time of day to check your readings and record them to bring to your follow-up visit. Dr. Aundra Dubin would like you to check your blood pressure 2 hours after taking morning meds, if your SBP is below 95 for quite some time, please give [place] a call  Your physician has requested that you have an echocardiogram. Echocardiography is a painless test that uses sound waves to create images of your heart. It provides your doctor with information about the size and shape of your heart and how well your heart's chambers and valves are working. This procedure takes approximately one hour. There are no restrictions for this procedure.  Your physician recommends that you schedule a follow-up appointment in: 2 months  Do the following things EVERYDAY: 1) Weigh yourself in the morning before breakfast. Write it down and keep it in a log. 2) Take your medicines as prescribed 3) Eat low salt foods-Limit salt (sodium) to 2000 mg per day.  4) Stay as active as you can everyday 5) Limit all fluids for the day to less than 2 liters 6)

## 2015-09-26 ENCOUNTER — Encounter: Payer: Self-pay | Admitting: Cardiology

## 2015-09-26 DIAGNOSIS — Z95810 Presence of automatic (implantable) cardiac defibrillator: Secondary | ICD-10-CM | POA: Insufficient documentation

## 2015-09-26 NOTE — Progress Notes (Signed)
Patient ID: Isaiah Wu, male   DOB: Jan 08, 1951, 64 y.o.   MRN: [id number]   Isaiah Wu  Date of visit:  09/26/2015 DOB:  [birth year]    Age:  62 yrs. Medical record number:  [id number]     Account number:  [id number] Care Provider: Hulan Fess ____________________________ CURRENT DIAGNOSES  1. Cardiomyopathy, unspecified  2. Chronic systolic heart failure  3. Left bundle-branch block  4. Presence of automatic (implantable) cardiac defibrillator  5. Essential hypertension ____________________________ ALLERGIES  No Known Allergies ____________________________ MEDICATIONS  1. Benadryl 25 mg capsule, PRN  2. digoxin 125 mcg tablet, 1 p.o. daily  3. spironolactone 25 mg tablet, 1 p.o. daily  4. carvedilol 6.25 mg tablet, BID  5. furosemide 40 mg tablet, 1 tab if weight over 198 lbs  6. Entresto 49 mg-51 mg tablet, BID  7. Klor-Con M20 mEq tablet,extended release, 1 when takes furosemide ____________________________ CHIEF COMPLAINTS  Followup of Cardiomyopathy, unspecified  Followup of Chronic systolic heart failure ____________________________ HISTORY OF PRESENT ILLNESS Patient seen for cardiac followup. Since he was previously here he underwent implantation of a biventricular defibrillator of August 1. This was well and he has noted some improvement in his energy since that time. A repeat echo done on September 30 showed improvement in his ejection fraction 25-30%. His Entresto has been titrated up. He is able to walk now on a daily basis and is much improved symptomatically. He does note some fatigue at the end of the day and is able to work about 3 hours per day and feels good. He no longer has to take a nap in the afternoon. He has been followed closely in the advanced heart failure clinic. ____________________________ PAST HISTORY  Past Medical Illnesses:  hypertension, GERD, obesity;  Cardiovascular Illnesses:  cardiomyopathy(idiopathic);  Surgical Procedures:  no  previous surgical procedures;  NYHA Classification:  I;  Canadian Angina Classification:  Class 0: Asymptomatic;  Cardiology Procedures-Invasive:  St. Jude AICD implant August 2016, cardiac cath (left) April 2016;  Cardiology Procedures-Noninvasive:  cardiac MRI June 2016, echocardiogram April 2016;  Cardiac Cath Results:  normal coronary arteries;  LVEF of 30% documented via echocardiogram on 09/19/2015,   ____________________________ CARDIO-PULMONARY TEST DATES EKG Date:  04/07/2015;   Cardiac Cath Date:  04/09/2015;  Echocardiography Date: 09/19/2015;  Chest Xray Date: 04/08/2015;  CT Scan Date:  05/07/2015   ____________________________ SOCIAL HISTORY Alcohol Use:  wine 1 per day;  Smoking:  used to smoke but quit Prior to 1980;  Diet:  regular diet;  Lifestyle:  married;  Exercise:  walking for approximately 60 minutes 6 days per week;  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 Weight:  205.00 lbs. Height:  73"BMI: 27  Constitutional:  pleasant white male in no acute distress, mildly obese Skin:  warm and dry to touch, no apparent skin lesions, or masses noted. Head:  normocephalic, normal hair pattern, no masses or tenderness Neck:  supple, without massess. No JVD, thyromegaly or carotid bruits. Carotid upstroke normal. Chest:  normal symmetry, clear to auscultation., healed ICD incision in the left pectoral area Cardiac:  regular rhythm, normal S1 and S2, S3 present, no murmur Peripheral Pulses:  the femoral,dorsalis pedis, and posterior tibial pulses are full and equal bilaterally with no bruits auscultated. Extremities &  Back:  no deformities, clubbing, cyanosis, erythema or edema observed. Normal muscle strength and tone. Neurological:  no gross motor or sensory  deficits noted, affect appropriate, oriented x3. ____________________________ IMPRESSIONS/PLAN  1. Nonischemic cardiomyopathy with improvement in ejection fraction to 25-30% following biventricular device implantation 2. Hypertension with blood pressure running on the low side 3. Functioning implantable defibrillator  Recommendations:  Records reviewed from the heart failure clinic as well as recent imaging studies. Clinically he has improved following his defibrillator implant and resynchronization. Recommended followup in 6 months. Depending on his level of improvement need to determine if he will return the care here or whether he will continue to be followed at the advanced heart failure clinic.  ____________________________ TODAYS ORDERS  1. Return Visit: 6 months  2. 12 Lead EKG: 6 months                       ____________________________ Cardiology Physician:  Kerry Hough MD [facility]

## 2015-10-05 ENCOUNTER — Other Ambulatory Visit (HOSPITAL_COMMUNITY): Payer: Self-pay | Admitting: Cardiology

## 2015-10-09 ENCOUNTER — Encounter: Payer: Self-pay | Admitting: Internal Medicine

## 2015-10-24 ENCOUNTER — Encounter: Payer: Self-pay | Admitting: Internal Medicine

## 2015-10-24 ENCOUNTER — Ambulatory Visit (INDEPENDENT_AMBULATORY_CARE_PROVIDER_SITE_OTHER): Payer: 59 | Admitting: Internal Medicine

## 2015-10-24 VITALS — BP 90/66 | HR 68 | Ht 73.0 in | Wt 204.6 lb

## 2015-10-24 DIAGNOSIS — I5022 Chronic systolic (congestive) heart failure: Secondary | ICD-10-CM | POA: Diagnosis not present

## 2015-10-24 DIAGNOSIS — I429 Cardiomyopathy, unspecified: Secondary | ICD-10-CM

## 2015-10-24 DIAGNOSIS — I447 Left bundle-branch block, unspecified: Secondary | ICD-10-CM | POA: Diagnosis not present

## 2015-10-24 DIAGNOSIS — I428 Other cardiomyopathies: Secondary | ICD-10-CM

## 2015-10-24 LAB — CUP PACEART INCLINIC DEVICE CHECK
Battery Remaining Longevity: 72 mo
Brady Statistic RA Percent Paced: 10 %
Brady Statistic RV Percent Paced: 98 %
Date Time Interrogation Session: 20161104122711
HighPow Impedance: 73.125
Implantable Lead Implant Date: 20160801
Implantable Lead Implant Date: 20160801
Implantable Lead Implant Date: 20160801
Implantable Lead Location: 753858
Implantable Lead Location: 753859
Implantable Lead Location: 753860
Implantable Lead Model: 7122
Lead Channel Impedance Value: 1187.5 Ohm
Lead Channel Impedance Value: 487.5 Ohm
Lead Channel Impedance Value: 600 Ohm
Lead Channel Pacing Threshold Amplitude: 0.75 V
Lead Channel Pacing Threshold Amplitude: 0.75 V
Lead Channel Pacing Threshold Amplitude: 1 V
Lead Channel Pacing Threshold Amplitude: 2 V
Lead Channel Pacing Threshold Amplitude: 2 V
Lead Channel Pacing Threshold Pulse Width: 0.5 ms
Lead Channel Pacing Threshold Pulse Width: 0.5 ms
Lead Channel Pacing Threshold Pulse Width: 0.5 ms
Lead Channel Pacing Threshold Pulse Width: 0.5 ms
Lead Channel Pacing Threshold Pulse Width: 0.5 ms
Lead Channel Sensing Intrinsic Amplitude: 11.7 mV
Lead Channel Sensing Intrinsic Amplitude: 4 mV
Lead Channel Setting Pacing Amplitude: 2 V
Lead Channel Setting Pacing Amplitude: 2.5 V
Lead Channel Setting Pacing Amplitude: 2.5 V
Lead Channel Setting Pacing Pulse Width: 0.5 ms
Lead Channel Setting Pacing Pulse Width: 0.5 ms
Lead Channel Setting Sensing Sensitivity: 0.5 mV
Pulse Gen Serial Number: 7290135

## 2015-10-24 NOTE — Progress Notes (Signed)
ode     Patient Care Team: Hulan Fess, MD as PCP - General (Family Medicine) Jacolyn Reedy, MD as Consulting Physician (Cardiology)   HPI  Isaiah Wu is a 64 y.o. male Seen in followup for CRT- ICD implanted for NICM LBBB and hx of syncope  Following CRT. Implantation he had significant improvement in exercise tolerance. This then has leveled out over the last couple of months. He does not have edema. No nocturnal dyspnea.   Echo (4/16)  EF 15%, severe diffuse hypokinesis [name]).  RHC/LHC (4/16) with normal coronaries; mean RA 10, PA 58/35 mean 48, mean PCWP 32,   cMRI 04/2015 with severely dilated LV, EF 14%, septal-lateral dyssynchrony (prominent), normal RV size with mild to moderately decreased systolic function, at least moderate functional mitral regurgitation, there was basal inferoseptal subepicardial LGE (small area) and apical septal subtle mid-wall LGE (small area).  Echo (9/16) with EF 25-30%, mild MR => improved.   Device History: STJ CRTD implanted 2016 for NICM, CHF, LBBB History of appropriate therapy: No History of AAD therapy: No     Past Medical History  Diagnosis Date  . Cardiomyopathy ([place]) 04/07/2015  . Hypertension   . LBBB (left bundle branch block)   . GERD (gastroesophageal reflux disease)   . Chronic systolic CHF (congestive heart failure) ([place]) 04/14/2015  . Syncope 05/30/2015  . AICD (automatic cardioverter/defibrillator) present     Past Surgical History  Procedure Laterality Date  . Shoulder surgery Left 05/2008    "had to put cadavear bone in and reattach muscle to it"  . Orif elbow fracture Left 1981  . Left and right heart catheterization with coronary angiogram N/A 04/09/2015    Procedure: LEFT AND RIGHT HEART CATHETERIZATION WITH CORONARY ANGIOGRAM;  Surgeon: Jacolyn Reedy, MD;  Location: [facility] CATH LAB;  Service: Cardiovascular;  Laterality: N/A;  . Ep implantable device N/A 07/21/2015    Procedure: BiV ICD Insertion CRT-D;   Surgeon: Deboraha Sprang, MD;  Location: [facility] CV LAB;  Service: Cardiovascular;  Laterality: N/A;  . Tonsillectomy and adenoidectomy  1960's  . Fracture surgery    . Cardiac catheterization      Current Outpatient Prescriptions  Medication Sig Dispense Refill  . carvedilol (COREG) 6.25 MG tablet Take 1 tablet (6.25 mg total) by mouth 2 (two) times daily. 180 tablet 3  . digoxin (LANOXIN) 0.125 MG tablet Take 1 tablet (0.125 mg total) by mouth daily. 30 tablet 12  . furosemide (LASIX) 40 MG tablet Take 1 tablet (40 mg total) by mouth as needed (for weight 198 lb or greater). 30 tablet 12  . potassium chloride SA (KLOR-CON M20) 20 MEQ tablet Take 1 tablet (20 mEq total) by mouth daily as needed. To be taken only when you take Lasix. 30 tablet 3  . sacubitril-valsartan (ENTRESTO) 49-51 MG Take 1 tablet by mouth 2 (two) times daily. 60 tablet 6  . spironolactone (ALDACTONE) 25 MG tablet Take 1 tablet (25 mg total) by mouth daily. 30 tablet 12   No current facility-administered medications for this visit.    No Known Allergies    Review of Systems negative except from HPI and PMH  Physical Exam BP 90/66 mmHg  Pulse 68  Ht 6\' 1"  (1.854 m)  Wt 204 lb 9.6 oz (92.806 kg)  BMI 27.00 kg/m2 Well developed and well nourished in no acute distress HENT normal E scleral and icterus clear Neck Supple JVP flat; carotids brisk and full Clear to ausculation.dp Device pocket  well healed; without hematoma or erythema.  There is no tethering   Regular rate and rhythm, no murmurs gallops or rub Soft with active bowel sounds No clubbing cyanosis  Edema Alert and oriented, grossly normal motor and sensory function Skin Warm and Dry  He demonstrates P synchronous pacing with a rS configuration in lead V1 and a qR  configuration lead 1  Assessment and  Plan  Nonischemic cardiomyopathy  Congestive heart failure  CRT-D  Hypotension   The patient has had a fall. He is getting slowly.  He is otherwise tolerating his medications well.  We have reprogrammed the AV delay to about 70% of intrinsic; we will repeat electrocardiogram. ECG today is not dissimilar from post implant ECG. Review of the chest x-ray demonstrated appropriate pacing hold RL V1 and V2 as programmed.  Repeat ECG shows a more prominent Qr in I

## 2015-10-24 NOTE — Patient Instructions (Signed)
Medication Instructions: - no changes  Labwork: - none  Procedures/Testing: - none  Follow-Up: - Remote monitoring is used to monitor your Pacemaker of ICD from home. This monitoring reduces the number of office visits required to check your device to one time per year. It allows [place] to keep an eye on the functioning of your device to ensure it is working properly. You are scheduled for a device check from home on 01/26/16. You may send your transmission at any time that day. If you have a wireless device, the transmission will be sent automatically. After your physician reviews your transmission, you will receive a postcard with your next transmission date.  - Your physician wants you to follow-up in: 9 months with Dr. Caryl Comes. You will receive a reminder letter in the mail two months in advance. If you don't receive a letter, please call our office to schedule the follow-up appointment.  Any Additional Special Instructions Will Be Listed Below (If Applicable).

## 2015-11-18 ENCOUNTER — Ambulatory Visit (HOSPITAL_COMMUNITY)
Admission: RE | Admit: 2015-11-18 | Discharge: 2015-11-18 | Disposition: A | Discharge status: Home / Self Care | Payer: 59 | Source: Ambulatory Visit | Attending: Cardiology | Admitting: Cardiology

## 2015-11-18 ENCOUNTER — Encounter (HOSPITAL_COMMUNITY): Payer: Self-pay

## 2015-11-18 VITALS — BP 108/62 | HR 79 | Wt 202.5 lb

## 2015-11-18 DIAGNOSIS — I5022 Chronic systolic (congestive) heart failure: Secondary | ICD-10-CM | POA: Diagnosis present

## 2015-11-18 DIAGNOSIS — I447 Left bundle-branch block, unspecified: Secondary | ICD-10-CM | POA: Insufficient documentation

## 2015-11-18 LAB — BASIC METABOLIC PANEL
Anion gap: 9 (ref 5–15)
BUN: 20 mg/dL (ref 6–20)
CO2: 21 mmol/L — ABNORMAL LOW (ref 22–32)
Calcium: 9.9 mg/dL (ref 8.9–10.3)
Chloride: 107 mmol/L (ref 101–111)
Creatinine, Ser: 1.09 mg/dL (ref 0.61–1.24)
GFR calc Af Amer: >60 mL/min (ref >60)
GFR calc non Af Amer: >60 mL/min (ref >60)
Glucose, Bld: 112 mg/dL — ABNORMAL HIGH (ref 65–99)
Potassium: 4.3 mmol/L (ref 3.5–5.1)
Sodium: 137 mmol/L (ref 135–145)

## 2015-11-18 LAB — BRAIN NATRIURETIC PEPTIDE: B Natriuretic Peptide: 23.3 pg/mL (ref 0.0–100.0)

## 2015-11-18 LAB — DIGOXIN LEVEL: Digoxin Level: 0.6 ng/mL — ABNORMAL LOW (ref 0.8–2.0)

## 2015-11-18 MED ORDER — CARVEDILOL 6.25 MG PO TABS: ORAL_TABLET | ORAL | Status: DC

## 2015-11-18 NOTE — Patient Instructions (Signed)
Your provider requests you have a Cardiopulmonary Exercise Test (CPX).  FOLLOW UP: 69months with Dr.McLean  INCREASE Coreg to 6.25 mg (1 tablet) in the morning and 9.375mg  (1 & 1/2 tablets) in the evening.  Routine lab work today. (bmet bnp dig) Will notify you of abnormal results  Do the following things EVERYDAY: 1) Weigh yourself in the morning before breakfast. Write it down and keep it in a log. 2) Take your medicines as prescribed 3) Eat low salt foods-Limit salt (sodium) to 2000 mg per day.  4) Stay as active as you can everyday 5) Limit all fluids for the day to less than 2 liters

## 2015-11-18 NOTE — Progress Notes (Signed)
Patient ID: Katie Schowalter, male   DOB: [birth year], 64 y.o.   MRN: [id number]  PCP: Dr. Rex Kras HF Cardiology: Dr. Aundra Dubin  64 yo with minimal PMH diagnosed with systolic HF in [id number].Echo showed EF 15% with diffuse hypokinesis.  Initially seen by Dr. [name]. He was admitted to [facility] in 4/16 for diuresis.  RHC/LHC showed elevated filling pressures and no significant coronary disease.  Underwent St Jude BiV-ICD on 07/21/15.   SBP running 90s-110s at home.  No lightheadedness.  No significant exertional dyspnea.  No chest pain.  No orthopnea/PND.  He fatigues easily and feels like his stamina is considerably worse than it was prior to his heart problems.  He is looking for part-time work.  Labs 4/16: TSH normal, SPEP negative, ferritin normal, HIV negative, K 4.6, creatinine 1.22, HCT 46.9 Labs 6/16: K 3.8, Creatinine 1.08 Labs 07/18/15: K 4.1, Creatinine 0.99 Labs 9/16: digoxin 0.3, K 4.3, creatinine 1.03  PMH: 1. HTN 2. GERD 3. LBBB 4. Cardiomyopathy: Nonischemic.  Echo (4/16) with EF 15%, severe diffuse hypokinesis [name]).  RHC/LHC (4/16) with normal coronaries; mean RA 10, PA 58/35 mean 48, mean PCWP 32, no comment on CO.  HIV negative, TSH normal, ferritin normal, SPEP normal. S/p St Jude CRT-D 07/21/15 cMRI  04/2015 with severely dilated LV, EF 14%, septal-lateral dyssynchrony (prominent), normal RV size with mild to moderately decreased systolic function, at least moderate functional mitral regurgitation, there was basal inferoseptal subepicardial LGE (small area) and apical septal subtle mid-wall LGE (small area).  Echo (9/16) with EF 25-30%, mild MR => improved. 5. Syncopal episode 6/16: Monitor ok. Suspect orthostasis.    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  No cardiomyopathy or sudden death.   ROS: All systems reviewed and negative except as per HPI.   Current Outpatient  Prescriptions  Medication Sig Dispense Refill  . carvedilol (COREG) 6.25 MG tablet Take 6.25mg  (1 tablet) in the morning and 9.375mg  ( 1 & 1/2 tablets) in the evening. 90 tablet 3  . digoxin (LANOXIN) 0.125 MG tablet Take 1 tablet (0.125 mg total) by mouth daily. 30 tablet 12  . furosemide (LASIX) 40 MG tablet Take 1 tablet (40 mg total) by mouth as needed (for weight 198 lb or greater). 30 tablet 12  . potassium chloride SA (KLOR-CON M20) 20 MEQ tablet Take 1 tablet (20 mEq total) by mouth daily as needed. To be taken only when you take Lasix. 30 tablet 3  . sacubitril-valsartan (ENTRESTO) 49-51 MG Take 1 tablet by mouth 2 (two) times daily. 60 tablet 6  . spironolactone (ALDACTONE) 25 MG tablet Take 1 tablet (25 mg total) by mouth daily. 30 tablet 12   No current facility-administered medications for this encounter.   BP 108/62 mmHg  Pulse 79  Wt 202 lb 8 oz (91.853 kg)  SpO2 98% General: NAD Neck: No JVD, no thyromegaly or thyroid nodule.  Lungs: Clear to auscultation bilaterally with normal respiratory effort. CV: Nondisplaced PMI.  Heart regular S1/S2, no S3/S4, no murmur. No peripheral edema.  No carotid bruit.  Normal pedal pulses.  Abdomen: Soft, nontender, no hepatosplenomegaly, mild distention.  Skin: Intact without lesions or rashes.  Neurologic: Alert and oriented x 3.  Psych: Normal affect. Extremities: No clubbing or cyanosis.  HEENT: Normal.   Assessment/Plan:  1. Chronic systolic CHF: Nonischemic cardiomyopathy, EF 15% initially, up to 25-30% with [facility]  CRT-D. Currently NYHA class II without volume overload.  Main complaint is fatigue.  Etiology of cardiomyopathy is uncertain: normal coronaries, no symptoms suggestive of viral syndrome prior to admission, HIV/Ferritin/SPEP/TSH unremarkable.  No history of familial CMP.  He has LBBB of uncertain duration.  ? LBBB cardiomyopathy.  However, there was also mid-wall LGE in the septum on cardiac MRI, suggesting possibility of  myocarditis.  - Continue Lasix prn.    - Continue spironolactone 25 mg daily and digoxin 0.125 mg daily.  - Increase Coreg to 6.25 mg qam, 9.375 mg qpm.  If he tolerates this, after a week or so can increase to 9.375 mg bid.    - Continue Entresto 49/51 bid.  - Check BMET, digoxin level, and BNP today.  - Repeat echo in 3/17 to see if EF has improved further.  - I will arrange for CPX.  2. LBBB: Of uncertain duration.    Followup in 2 months.   Dalton McLean,MD 11/18/2015

## 2015-11-20 ENCOUNTER — Telehealth (HOSPITAL_COMMUNITY): Payer: Self-pay | Admitting: Pharmacist

## 2015-11-20 NOTE — Telephone Encounter (Signed)
Entresto 49-51 mg PA approved through [facility] until 11/18/2016.   Ruta Hinds. Velva Harman, PharmD, BCPS, CPP Clinical Pharmacist Pager: [phone] Phone: [phone] 11/20/2015 12:46 PM

## 2015-11-21 ENCOUNTER — Encounter (HOSPITAL_COMMUNITY): Payer: BLUE CROSS/BLUE SHIELD

## 2015-11-26 ENCOUNTER — Encounter (HOSPITAL_COMMUNITY): Payer: 59

## 2015-12-11 ENCOUNTER — Encounter: Payer: Self-pay | Admitting: Cardiology

## 2015-12-19 ENCOUNTER — Telehealth (HOSPITAL_COMMUNITY): Payer: Self-pay | Admitting: Vascular Surgery

## 2015-12-19 NOTE — Telephone Encounter (Signed)
Left pt a detailed pt stating CPX machine is down will not be repaired until next , he will need to reschedule

## 2015-12-24 ENCOUNTER — Encounter (HOSPITAL_COMMUNITY): Payer: 59

## 2015-12-29 ENCOUNTER — Telehealth (HOSPITAL_COMMUNITY): Payer: Self-pay

## 2015-12-29 NOTE — Telephone Encounter (Signed)
[employer] forms and paperwork completed and signed by Dr. Loralie Champagne. Part of forms picked up by patient, medical record requests faxed to provided # [phone] Copy of requests and forms scanned into patient's electronic medical record.  [name]

## 2016-01-07 ENCOUNTER — Encounter (HOSPITAL_COMMUNITY): Payer: 59

## 2016-01-14 ENCOUNTER — Ambulatory Visit (HOSPITAL_COMMUNITY): Payer: 59 | Attending: Cardiology

## 2016-01-14 DIAGNOSIS — I5022 Chronic systolic (congestive) heart failure: Secondary | ICD-10-CM

## 2016-01-15 ENCOUNTER — Encounter (HOSPITAL_COMMUNITY): Payer: Self-pay

## 2016-01-16 DIAGNOSIS — I5022 Chronic systolic (congestive) heart failure: Secondary | ICD-10-CM

## 2016-01-19 ENCOUNTER — Telehealth (HOSPITAL_COMMUNITY): Payer: Self-pay

## 2016-01-19 MED ORDER — SACUBITRIL-VALSARTAN 49-51 MG PO TABS: 1.0000 | ORAL_TABLET | Freq: Two times a day (BID) | ORAL | Status: DC

## 2016-01-19 NOTE — Telephone Encounter (Signed)
RX for [employer] printed and faxed to pharmacy per patient request [phone]

## 2016-01-23 ENCOUNTER — Encounter (HOSPITAL_COMMUNITY): Payer: Self-pay

## 2016-01-23 DIAGNOSIS — Z736 Limitation of activities due to disability: Secondary | ICD-10-CM

## 2016-01-26 ENCOUNTER — Ambulatory Visit (INDEPENDENT_AMBULATORY_CARE_PROVIDER_SITE_OTHER): Payer: Self-pay | Admitting: *Deleted

## 2016-01-26 DIAGNOSIS — I5022 Chronic systolic (congestive) heart failure: Secondary | ICD-10-CM

## 2016-01-26 DIAGNOSIS — I428 Other cardiomyopathies: Secondary | ICD-10-CM

## 2016-01-26 DIAGNOSIS — I429 Cardiomyopathy, unspecified: Secondary | ICD-10-CM

## 2016-01-27 NOTE — Progress Notes (Signed)
Remote ICD transmission.   

## 2016-01-30 ENCOUNTER — Encounter (HOSPITAL_COMMUNITY): Payer: Self-pay

## 2016-01-30 ENCOUNTER — Ambulatory Visit (HOSPITAL_COMMUNITY)
Admission: RE | Admit: 2016-01-30 | Discharge: 2016-01-30 | Disposition: A | Discharge status: Home / Self Care | Payer: Self-pay | Source: Ambulatory Visit | Attending: Cardiology | Admitting: Cardiology

## 2016-01-30 VITALS — BP 100/70 | HR 60 | Resp 18 | Wt 206.5 lb

## 2016-01-30 DIAGNOSIS — K219 Gastro-esophageal reflux disease without esophagitis: Secondary | ICD-10-CM | POA: Insufficient documentation

## 2016-01-30 DIAGNOSIS — I10 Essential (primary) hypertension: Secondary | ICD-10-CM | POA: Insufficient documentation

## 2016-01-30 DIAGNOSIS — Z79899 Other long term (current) drug therapy: Secondary | ICD-10-CM | POA: Insufficient documentation

## 2016-01-30 DIAGNOSIS — I429 Cardiomyopathy, unspecified: Secondary | ICD-10-CM | POA: Insufficient documentation

## 2016-01-30 DIAGNOSIS — I447 Left bundle-branch block, unspecified: Secondary | ICD-10-CM | POA: Insufficient documentation

## 2016-01-30 DIAGNOSIS — Z87891 Personal history of nicotine dependence: Secondary | ICD-10-CM | POA: Insufficient documentation

## 2016-01-30 DIAGNOSIS — I5022 Chronic systolic (congestive) heart failure: Secondary | ICD-10-CM | POA: Insufficient documentation

## 2016-01-30 LAB — BASIC METABOLIC PANEL
Anion gap: 12 (ref 5–15)
BUN: 18 mg/dL (ref 6–20)
CO2: 21 mmol/L — ABNORMAL LOW (ref 22–32)
Calcium: 9.6 mg/dL (ref 8.9–10.3)
Chloride: 105 mmol/L (ref 101–111)
Creatinine, Ser: 1.13 mg/dL (ref 0.61–1.24)
GFR calc Af Amer: >60 mL/min (ref >60)
GFR calc non Af Amer: >60 mL/min (ref >60)
Glucose, Bld: 92 mg/dL (ref 65–99)
Potassium: 4.2 mmol/L (ref 3.5–5.1)
Sodium: 138 mmol/L (ref 135–145)

## 2016-01-30 LAB — DIGOXIN LEVEL: Digoxin Level: 0.4 ng/mL — ABNORMAL LOW (ref 0.8–2.0)

## 2016-01-30 LAB — BRAIN NATRIURETIC PEPTIDE: B Natriuretic Peptide: 15.9 pg/mL (ref 0.0–100.0)

## 2016-01-30 MED ORDER — SACUBITRIL-VALSARTAN 49-51 MG PO TABS: 1.0000 | ORAL_TABLET | Freq: Two times a day (BID) | ORAL | Status: DC

## 2016-01-30 MED ORDER — CARVEDILOL 6.25 MG PO TABS: 9.3750 mg | ORAL_TABLET | Freq: Two times a day (BID) | ORAL | Status: DC

## 2016-01-30 NOTE — Patient Instructions (Signed)
Labs today  CONTINUE Coreg 9.375 mg, one and one half tab twice day  Your physician has requested that you have an echocardiogram. Echocardiography is a painless test that uses sound waves to create images of your heart. It provides your doctor with information about the size and shape of your heart and how well your heart's chambers and valves are working. This procedure takes approximately one hour. There are no restrictions for this procedure.  Your physician recommends that you schedule a follow-up appointment in: 1 month with Dr.McLean  Do the following things EVERYDAY: 1) Weigh yourself in the morning before breakfast. Write it down and keep it in a log. 2) Take your medicines as prescribed 3) Eat low salt foods-Limit salt (sodium) to 2000 mg per day.  4) Stay as active as you can everyday 5) Limit all fluids for the day to less than 2 liters 6)

## 2016-01-31 NOTE — Progress Notes (Signed)
Patient ID: Isaiah Wu, male   DOB: July 29, 1951, 65 y.o.   MRN: [id number] PCP: Dr. Rex Kras HF Cardiology: Dr. Aundra Dubin  65 yo with minimal PMH diagnosed with systolic HF in A999333.Echo showed EF 15% with diffuse hypokinesis.  Initially seen by Dr. [name]. He was admitted to [facility] in 4/16 for diuresis.  RHC/LHC showed elevated filling pressures and no significant coronary disease.  Underwent St Jude BiV-ICD on 07/21/15.   Isaiah Wu is stable.  He does not get exertional dyspnea.  He is fatigued after walking up 2 flights of steps.  He is tired after about 10-15 minutes of exertion (doing yardwork).  No chest pain.  No orthopnea/PND.    Corevue reviewed today: stable impedance not suggestive of volume overload, >99% BiV pacing, no atrial fibrillation.   Labs 4/16: TSH normal, SPEP negative, ferritin normal, HIV negative, K 4.6, creatinine 1.22, HCT 46.9 Labs 6/16: K 3.8, Creatinine 1.08 Labs 07/18/15: K 4.1, Creatinine 0.99 Labs 9/16: digoxin 0.3, K 4.3, creatinine 1.03 Labs 11/16: K 4.3, creatinine 1.09, digoxin 0.6, BNP 23  PMH: 1. HTN 2. GERD 3. LBBB 4. Cardiomyopathy: Nonischemic.  Echo (4/16) with EF 15%, severe diffuse hypokinesis [name]).  RHC/LHC (4/16) with normal coronaries; mean RA 10, PA 58/35 mean 48, mean PCWP 32, no comment on CO.  HIV negative, TSH normal, ferritin normal, SPEP normal. S/p St Jude CRT-D 07/21/15 cMRI  04/2015 with severely dilated LV, EF 14%, septal-lateral dyssynchrony (prominent), normal RV size with mild to moderately decreased systolic function, at least moderate functional mitral regurgitation, there was basal inferoseptal subepicardial LGE (small area) and apical septal subtle mid-wall LGE (small area).  Echo (9/16) with EF 25-30%, mild Isaiah => improved. CPX (1/17) with VE/VCO2 26.2, peak VO2 20.3, RER 1.13 => mild heart failure limitation.  5. Syncopal episode 6/16: Monitor ok. Suspect orthostasis.    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  No cardiomyopathy or sudden death.   ROS: All systems reviewed and negative except as per HPI.   Current Outpatient Prescriptions  Medication Sig Dispense Refill  . carvedilol (COREG) 6.25 MG tablet Take 1.5 tablets (9.375 mg total) by mouth 2 (two) times daily with a meal. 90 tablet 3  . digoxin (LANOXIN) 0.125 MG tablet Take 1 tablet (0.125 mg total) by mouth daily. 30 tablet 12  . furosemide (LASIX) 40 MG tablet Take 1 tablet (40 mg total) by mouth as needed (for weight 198 lb or greater). 30 tablet 12  . potassium chloride SA (KLOR-CON M20) 20 MEQ tablet Take 1 tablet (20 mEq total) by mouth daily as needed. To be taken only when you take Lasix. 30 tablet 3  . sacubitril-valsartan (ENTRESTO) 49-51 MG Take 1 tablet by mouth 2 (two) times daily. 60 tablet 11  . sacubitril-valsartan (ENTRESTO) 49-51 MG Take 1 tablet by mouth 2 (two) times daily.    Marland Kitchen spironolactone (ALDACTONE) 25 MG tablet Take 1 tablet (25 mg total) by mouth daily. 30 tablet 12   No current facility-administered medications for this encounter.   BP 100/70 mmHg  Pulse 60  Resp 18  Wt 206 lb 8 oz (93.668 kg)  SpO2 100% General: NAD Neck: No JVD, no thyromegaly or thyroid nodule.  Lungs: Clear to auscultation bilaterally with normal respiratory effort. CV: Nondisplaced PMI.  Heart regular S1/S2, no S3/S4, no murmur. No peripheral edema.  No carotid bruit.  Normal pedal pulses.  Abdomen: Soft, nontender, no hepatosplenomegaly, mild distention.  Skin: Intact without lesions or rashes.  Neurologic: Alert and oriented x 3.  Psych: Normal affect. Extremities: No clubbing or cyanosis.  HEENT: Normal.   Assessment/Plan:  1. Chronic systolic CHF: Nonischemic cardiomyopathy, EF 15% initially, up to 25-30% with St Jude CRT-D. Currently NYHA class II without volume overload.  Main complaint is fatigue.  Etiology of cardiomyopathy is  uncertain: normal coronaries, no symptoms suggestive of viral syndrome prior to admission, HIV/Ferritin/SPEP/TSH unremarkable.  No history of familial CMP.  He has LBBB of uncertain duration.  ? LBBB cardiomyopathy.  However, there was also mid-wall LGE in the septum on cardiac MRI, suggesting possibility of myocarditis.  CPX in 1/17 suggested only a mild heart failure limitation.  - Continue Lasix prn.    - Continue spironolactone 25 mg daily and digoxin 0.125 mg daily. Check digoxin level and BMET today.  - Continue Coreg 9.375 mg bid, he does not have BP room to titrate.    - Continue Entresto 49/51 bid.  - Repeat echo in 3/17 to see if EF has improved further.  2. LBBB: Of uncertain duration.    Followup in 2 months.   Isaiah Mcguirt,MD 01/31/2016

## 2016-02-10 ENCOUNTER — Encounter (HOSPITAL_COMMUNITY): Payer: Self-pay

## 2016-02-10 NOTE — Progress Notes (Signed)
Medical record request completed on behalf of patient, all records 12/2013-present from CHF office faxed back to provided # [phone] Copy of request scanned into patient's electronic medical record.  [name]

## 2016-02-11 ENCOUNTER — Other Ambulatory Visit: Payer: Self-pay

## 2016-02-11 ENCOUNTER — Ambulatory Visit (HOSPITAL_COMMUNITY): Payer: Self-pay | Attending: Cardiology

## 2016-02-11 DIAGNOSIS — I5022 Chronic systolic (congestive) heart failure: Secondary | ICD-10-CM

## 2016-02-11 DIAGNOSIS — I110 Hypertensive heart disease with heart failure: Secondary | ICD-10-CM | POA: Insufficient documentation

## 2016-02-11 DIAGNOSIS — I38 Endocarditis, valve unspecified: Secondary | ICD-10-CM | POA: Insufficient documentation

## 2016-02-11 DIAGNOSIS — I447 Left bundle-branch block, unspecified: Secondary | ICD-10-CM | POA: Insufficient documentation

## 2016-02-11 DIAGNOSIS — Z87891 Personal history of nicotine dependence: Secondary | ICD-10-CM | POA: Insufficient documentation

## 2016-02-24 ENCOUNTER — Encounter (HOSPITAL_COMMUNITY): Payer: Self-pay

## 2016-02-24 NOTE — Progress Notes (Signed)
[facility] fax received for medical records. States there was an initial reques ton 01/20/2016, however, we have no record of this, and second request does not specify what was requested at that time. Last OV note, lab work, surgical procedure, EKG, and media/imaging/diagnostic studies faxed to provided # [phone] Copy of request scanned into patient's electronic medical record. Case # [id number]  [name]

## 2016-02-27 LAB — CUP PACEART REMOTE DEVICE CHECK
Battery Remaining Longevity: 70 mo
Battery Remaining Percentage: 90 %
Battery Voltage: 3.1 V
Brady Statistic AP VP Percent: 12 %
Brady Statistic AP VS Percent: 1 %
Brady Statistic AS VP Percent: 88 %
Brady Statistic AS VS Percent: 1 %
Brady Statistic RA Percent Paced: 12 %
Date Time Interrogation Session: 20170206070014
HighPow Impedance: 74 Ohm
HighPow Impedance: 74 Ohm
Implantable Lead Implant Date: 20160801
Implantable Lead Implant Date: 20160801
Implantable Lead Implant Date: 20160801
Implantable Lead Location: 753858
Implantable Lead Location: 753859
Implantable Lead Location: 753860
Implantable Lead Model: 7122
Lead Channel Impedance Value: 1150 Ohm
Lead Channel Impedance Value: 450 Ohm
Lead Channel Impedance Value: 550 Ohm
Lead Channel Pacing Threshold Amplitude: 0.75 V
Lead Channel Pacing Threshold Amplitude: 0.875 V
Lead Channel Pacing Threshold Amplitude: 2 V
Lead Channel Pacing Threshold Pulse Width: 0.5 ms
Lead Channel Pacing Threshold Pulse Width: 0.5 ms
Lead Channel Pacing Threshold Pulse Width: 0.5 ms
Lead Channel Sensing Intrinsic Amplitude: 11.7 mV
Lead Channel Sensing Intrinsic Amplitude: 5 mV
Lead Channel Setting Pacing Amplitude: 1.875
Lead Channel Setting Pacing Amplitude: 2.5 V
Lead Channel Setting Pacing Amplitude: 2.5 V
Lead Channel Setting Pacing Pulse Width: 0.5 ms
Lead Channel Setting Pacing Pulse Width: 0.5 ms
Lead Channel Setting Sensing Sensitivity: 0.5 mV
Pulse Gen Serial Number: 7290135

## 2016-03-01 ENCOUNTER — Encounter (HOSPITAL_COMMUNITY): Payer: Self-pay | Admitting: *Deleted

## 2016-03-01 ENCOUNTER — Ambulatory Visit (HOSPITAL_COMMUNITY)
Admission: RE | Admit: 2016-03-01 | Discharge: 2016-03-01 | Disposition: A | Discharge status: Home / Self Care | Payer: Self-pay | Source: Ambulatory Visit | Attending: Cardiology | Admitting: Cardiology

## 2016-03-01 ENCOUNTER — Encounter (HOSPITAL_COMMUNITY): Payer: Self-pay

## 2016-03-01 ENCOUNTER — Encounter (HOSPITAL_COMMUNITY): Payer: Self-pay | Admitting: Pharmacist

## 2016-03-01 VITALS — BP 114/66 | HR 62 | Wt 207.5 lb

## 2016-03-01 DIAGNOSIS — Z95810 Presence of automatic (implantable) cardiac defibrillator: Secondary | ICD-10-CM | POA: Insufficient documentation

## 2016-03-01 DIAGNOSIS — I110 Hypertensive heart disease with heart failure: Secondary | ICD-10-CM | POA: Insufficient documentation

## 2016-03-01 DIAGNOSIS — Z8249 Family history of ischemic heart disease and other diseases of the circulatory system: Secondary | ICD-10-CM | POA: Insufficient documentation

## 2016-03-01 DIAGNOSIS — I428 Other cardiomyopathies: Secondary | ICD-10-CM | POA: Insufficient documentation

## 2016-03-01 DIAGNOSIS — I5022 Chronic systolic (congestive) heart failure: Secondary | ICD-10-CM

## 2016-03-01 DIAGNOSIS — Z823 Family history of stroke: Secondary | ICD-10-CM | POA: Insufficient documentation

## 2016-03-01 DIAGNOSIS — Z87891 Personal history of nicotine dependence: Secondary | ICD-10-CM | POA: Insufficient documentation

## 2016-03-01 DIAGNOSIS — I447 Left bundle-branch block, unspecified: Secondary | ICD-10-CM

## 2016-03-01 DIAGNOSIS — K219 Gastro-esophageal reflux disease without esophagitis: Secondary | ICD-10-CM | POA: Insufficient documentation

## 2016-03-01 DIAGNOSIS — Z79899 Other long term (current) drug therapy: Secondary | ICD-10-CM | POA: Insufficient documentation

## 2016-03-01 LAB — LIPID PANEL
Cholesterol: 150 mg/dL (ref 0–200)
HDL: 47 mg/dL (ref >40)
LDL Cholesterol: 65 mg/dL (ref 0–99)
Total CHOL/HDL Ratio: 3.2 RATIO
Triglycerides: 190 mg/dL — ABNORMAL HIGH (ref <150)
VLDL: 38 mg/dL (ref 0–40)

## 2016-03-01 LAB — BASIC METABOLIC PANEL
Anion gap: 11 (ref 5–15)
BUN: 16 mg/dL (ref 6–20)
CO2: 20 mmol/L — ABNORMAL LOW (ref 22–32)
Calcium: 9.8 mg/dL (ref 8.9–10.3)
Chloride: 108 mmol/L (ref 101–111)
Creatinine, Ser: 1.09 mg/dL (ref 0.61–1.24)
GFR calc Af Amer: >60 mL/min (ref >60)
GFR calc non Af Amer: >60 mL/min (ref >60)
Glucose, Bld: 110 mg/dL — ABNORMAL HIGH (ref 65–99)
Potassium: 4.2 mmol/L (ref 3.5–5.1)
Sodium: 139 mmol/L (ref 135–145)

## 2016-03-01 MED ORDER — SACUBITRIL-VALSARTAN 49-51 MG PO TABS: 1.0000 | ORAL_TABLET | Freq: Two times a day (BID) | ORAL | Status: DC

## 2016-03-01 NOTE — Progress Notes (Signed)
Patient ID: Isaiah Wu, male   DOB: 1951/01/16, 65 y.o.   MRN: [id number] PCP: Dr. Rex Kras HF Cardiology: Dr. Aundra Dubin  65 yo with minimal PMH diagnosed with systolic HF in A999333.Echo showed EF 15% with diffuse hypokinesis.  Initially seen by Dr. [name]. He was admitted to [facility] in 4/16 for diuresis.  RHC/LHC showed elevated filling pressures and no significant coronary disease.  Underwent St Jude BiV-ICD on 07/21/15.  Repeat echo in 2/17 showed EF up to 40-45%.    Mr Blandin is stable.  He does not get exertional dyspnea, walking 3.5 miles/day.  He is fatigued after walking up 2 flights of steps.  Mild bendopnea. Weight is stable.  He is tired after about 10-15 minutes of exertion (doing yardwork).  No chest pain.  No orthopnea/PND.  He gets mildly lightheaded if he stands too fast in the evening after his pm meds, generally no problems with this during the day.   Corevue reviewed today: stable impedance not suggestive of volume overload, >99% BiV pacing, no atrial fibrillation.   Labs 4/16: TSH normal, SPEP negative, ferritin normal, HIV negative, K 4.6, creatinine 1.22, HCT 46.9 Labs 6/16: K 3.8, Creatinine 1.08 Labs 07/18/15: K 4.1, Creatinine 0.99 Labs 9/16: digoxin 0.3, K 4.3, creatinine 1.03 Labs 11/16: K 4.3, creatinine 1.09, digoxin 0.6, BNP 23 Labs 2/17: K 4.2, creatinine 1.13, BNP 15.9, digoxin 0.4  PMH: 1. HTN 2. GERD 3. LBBB 4. Cardiomyopathy: Nonischemic.  Echo (4/16) with EF 15%, severe diffuse hypokinesis [name]).  RHC/LHC (4/16) with normal coronaries; mean RA 10, PA 58/35 mean 48, mean PCWP 32, no comment on CO.  HIV negative, TSH normal, ferritin normal, SPEP normal. S/p St Jude CRT-D 07/21/15 cMRI  04/2015 with severely dilated LV, EF 14%, septal-lateral dyssynchrony (prominent), normal RV size with mild to moderately decreased systolic function, at least moderate functional mitral regurgitation, there was basal inferoseptal subepicardial LGE (small area) and apical septal subtle  mid-wall LGE (small area).  Echo (9/16) with EF 25-30%, mild MR => improved. CPX (1/17) with VE/VCO2 26.2, peak VO2 20.3, RER 1.13 => mild heart failure limitation.  Echo (2/17) EF 40-45%.   5. Syncopal episode 6/16: Monitor ok. Suspect orthostasis.    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  No cardiomyopathy or sudden death.   ROS: All systems reviewed and negative except as per HPI.   Current Outpatient Prescriptions  Medication Sig Dispense Refill  . carvedilol (COREG) 6.25 MG tablet Take 1.5 tablets (9.375 mg total) by mouth 2 (two) times daily with a meal. 90 tablet 3  . digoxin (LANOXIN) 0.125 MG tablet Take 1 tablet (0.125 mg total) by mouth daily. 30 tablet 12  . furosemide (LASIX) 40 MG tablet Take 40 mg by mouth daily.    . potassium chloride SA (K-DUR,KLOR-CON) 20 MEQ tablet Take 20 mEq by mouth every other day.    . sacubitril-valsartan (ENTRESTO) 49-51 MG Take 1 tablet by mouth 2 (two) times daily. 60 tablet 11  . spironolactone (ALDACTONE) 25 MG tablet Take 1 tablet (25 mg total) by mouth daily. 30 tablet 12   No current facility-administered medications for this encounter.   BP 114/66 mmHg  Pulse 62  Wt 207 lb 8 oz (94.121 kg)  SpO2 99% General: NAD Neck: No JVD, no thyromegaly or thyroid nodule.  Lungs: Clear to auscultation bilaterally with normal respiratory effort. CV: Nondisplaced  PMI.  Heart regular S1/S2, no S3/S4, no murmur. No peripheral edema.  No carotid bruit.  Normal pedal pulses.  Abdomen: Soft, nontender, no hepatosplenomegaly, mild distention.  Skin: Intact without lesions or rashes.  Neurologic: Alert and oriented x 3.  Psych: Normal affect. Extremities: No clubbing or cyanosis.  HEENT: Normal.   Assessment/Plan:  1. Chronic systolic CHF: Nonischemic cardiomyopathy, EF 15% initially, up to 40-45% with St Jude CRT-D. Currently NYHA class II  without volume overload by exam or Corevue. Etiology of cardiomyopathy is uncertain: normal coronaries, no symptoms suggestive of viral syndrome prior to admission, HIV/Ferritin/SPEP/TSH unremarkable.  No history of familial CMP.  He has LBBB of uncertain duration.  ? LBBB cardiomyopathy.  However, there was also mid-wall LGE in the septum on cardiac MRI, suggesting possibility of myocarditis.  CPX in 1/17 suggested only a mild heart failure limitation.  - Continue Lasix prn.    - Continue spironolactone 25 mg daily and digoxin 0.125 mg daily.  - Continue Coreg 9.375 mg bid, I will not increase given his occasional orthostatic-type symptoms.    - Continue Entresto 49/51 bid.  - BMET today.  - Given some evening LH, asked him to move his pm meds back later in the evening.  2. LBBB: Of uncertain duration.    Followup in 2 months.   Khi Mcmillen,MD 03/01/2016

## 2016-03-01 NOTE — Patient Instructions (Signed)
Routine lab work today. Will notify you of abnormal results, otherwise no news is good news!  Follow up 3 months.  Do the following things EVERYDAY: 1) Weigh yourself in the morning before breakfast. Write it down and keep it in a log. 2) Take your medicines as prescribed 3) Eat low salt foods-Limit salt (sodium) to 2000 mg per day.  4) Stay as active as you can everyday 5) Limit all fluids for the day to less than 2 liters  

## 2016-03-01 NOTE — Progress Notes (Signed)
Advanced Heart Failure Medication Review by a Pharmacist  Does the patient  feel that his/her medications are working for him/her?  yes  Has the patient been experiencing any side effects to the medications prescribed?  Yes (lightheadedness in PM)  Does the patient measure his/her own blood pressure or blood glucose at home?  yes   Does the patient have any problems obtaining medications due to transportation or finances?   no  Understanding of regimen: good Understanding of indications: excellent Potential of compliance: good Patient understands to avoid NSAIDs. Patient understands to avoid decongestants.  Issues to address at subsequent visits: None   Pharmacist comments: 65 yo pleasant male presenting to HF clinic with his BP/Weight log and medication list.  Pt states that he is needing lasix daily and is taking KCl every other day.  Pt reports lightheadedness in the PM after he takes his PM medications.  Pt reports no adherence issues but does state that he has some issues obtaining entresto through patient assistance.  Will followup about the cost of his entresto.     Time with patient: 5 min  Preparation and documentation time: 5 min  Total time: 10 min

## 2016-03-03 ENCOUNTER — Encounter: Payer: Self-pay | Admitting: Cardiology

## 2016-03-05 ENCOUNTER — Encounter (HOSPITAL_COMMUNITY): Payer: Self-pay

## 2016-03-09 ENCOUNTER — Encounter: Payer: Self-pay | Admitting: Internal Medicine

## 2016-03-12 ENCOUNTER — Telehealth (HOSPITAL_COMMUNITY): Payer: Self-pay | Admitting: Pharmacist

## 2016-03-12 NOTE — Telephone Encounter (Signed)
[employer] patient assistance approved Entresto 49-51 mg at no cost to Mr. Rosello through 03/10/17. Patient made aware.   Ruta Hinds. Velva Harman, PharmD, BCPS, CPP Clinical Pharmacist Pager: [phone] Phone: [phone] 03/12/2016 9:28 AM

## 2016-03-18 DIAGNOSIS — Z736 Limitation of activities due to disability: Secondary | ICD-10-CM

## 2016-04-26 ENCOUNTER — Ambulatory Visit (INDEPENDENT_AMBULATORY_CARE_PROVIDER_SITE_OTHER): Payer: Self-pay | Admitting: *Deleted

## 2016-04-26 DIAGNOSIS — I5022 Chronic systolic (congestive) heart failure: Secondary | ICD-10-CM

## 2016-04-26 DIAGNOSIS — I428 Other cardiomyopathies: Secondary | ICD-10-CM

## 2016-04-26 DIAGNOSIS — I429 Cardiomyopathy, unspecified: Secondary | ICD-10-CM

## 2016-04-27 NOTE — Progress Notes (Signed)
Remote ICD transmission.   

## 2016-04-29 ENCOUNTER — Other Ambulatory Visit (HOSPITAL_COMMUNITY): Payer: Self-pay | Admitting: *Deleted

## 2016-04-29 DIAGNOSIS — I5022 Chronic systolic (congestive) heart failure: Secondary | ICD-10-CM

## 2016-04-29 MED ORDER — CARVEDILOL 6.25 MG PO TABS: 9.3750 mg | ORAL_TABLET | Freq: Two times a day (BID) | ORAL | Status: DC

## 2016-06-02 ENCOUNTER — Encounter: Payer: Self-pay | Admitting: Cardiology

## 2016-06-02 LAB — CUP PACEART REMOTE DEVICE CHECK
Battery Remaining Longevity: 65 mo
Battery Remaining Percentage: 86 %
Battery Voltage: 3.02 V
Brady Statistic AP VP Percent: 19 %
Brady Statistic AP VS Percent: 1 %
Brady Statistic AS VP Percent: 81 %
Brady Statistic AS VS Percent: 1 %
Brady Statistic RA Percent Paced: 18 %
Date Time Interrogation Session: 20170508060017
HighPow Impedance: 65 Ohm
HighPow Impedance: 65 Ohm
Implantable Lead Implant Date: 20160801
Implantable Lead Implant Date: 20160801
Implantable Lead Implant Date: 20160801
Implantable Lead Location: 753858
Implantable Lead Location: 753859
Implantable Lead Location: 753860
Implantable Lead Model: 7122
Lead Channel Impedance Value: 1100 Ohm
Lead Channel Impedance Value: 390 Ohm
Lead Channel Impedance Value: 440 Ohm
Lead Channel Pacing Threshold Amplitude: 0.75 V
Lead Channel Pacing Threshold Amplitude: 0.75 V
Lead Channel Pacing Threshold Amplitude: 2 V
Lead Channel Pacing Threshold Pulse Width: 0.5 ms
Lead Channel Pacing Threshold Pulse Width: 0.5 ms
Lead Channel Pacing Threshold Pulse Width: 0.5 ms
Lead Channel Sensing Intrinsic Amplitude: 11.7 mV
Lead Channel Sensing Intrinsic Amplitude: 5 mV
Lead Channel Setting Pacing Amplitude: 1.75 V
Lead Channel Setting Pacing Amplitude: 2.5 V
Lead Channel Setting Pacing Amplitude: 2.5 V
Lead Channel Setting Pacing Pulse Width: 0.5 ms
Lead Channel Setting Pacing Pulse Width: 0.5 ms
Lead Channel Setting Sensing Sensitivity: 0.5 mV
Pulse Gen Serial Number: 7290135

## 2016-06-07 ENCOUNTER — Encounter (HOSPITAL_COMMUNITY): Payer: Self-pay

## 2016-06-07 ENCOUNTER — Ambulatory Visit (HOSPITAL_COMMUNITY)
Admission: RE | Admit: 2016-06-07 | Discharge: 2016-06-07 | Disposition: A | Discharge status: Home / Self Care | Payer: Self-pay | Source: Ambulatory Visit | Attending: Cardiology | Admitting: Cardiology

## 2016-06-07 VITALS — BP 112/72 | HR 62 | Wt 207.0 lb

## 2016-06-07 DIAGNOSIS — Z79899 Other long term (current) drug therapy: Secondary | ICD-10-CM | POA: Insufficient documentation

## 2016-06-07 DIAGNOSIS — Z8249 Family history of ischemic heart disease and other diseases of the circulatory system: Secondary | ICD-10-CM | POA: Insufficient documentation

## 2016-06-07 DIAGNOSIS — I110 Hypertensive heart disease with heart failure: Secondary | ICD-10-CM | POA: Insufficient documentation

## 2016-06-07 DIAGNOSIS — I447 Left bundle-branch block, unspecified: Secondary | ICD-10-CM | POA: Insufficient documentation

## 2016-06-07 DIAGNOSIS — Z823 Family history of stroke: Secondary | ICD-10-CM | POA: Insufficient documentation

## 2016-06-07 DIAGNOSIS — Z87891 Personal history of nicotine dependence: Secondary | ICD-10-CM | POA: Insufficient documentation

## 2016-06-07 DIAGNOSIS — I428 Other cardiomyopathies: Secondary | ICD-10-CM | POA: Insufficient documentation

## 2016-06-07 DIAGNOSIS — I5022 Chronic systolic (congestive) heart failure: Secondary | ICD-10-CM | POA: Insufficient documentation

## 2016-06-07 DIAGNOSIS — K219 Gastro-esophageal reflux disease without esophagitis: Secondary | ICD-10-CM | POA: Insufficient documentation

## 2016-06-07 DIAGNOSIS — Z95810 Presence of automatic (implantable) cardiac defibrillator: Secondary | ICD-10-CM | POA: Insufficient documentation

## 2016-06-07 LAB — BASIC METABOLIC PANEL
Anion gap: 10 (ref 5–15)
BUN: 19 mg/dL (ref 6–20)
CO2: 19 mmol/L — ABNORMAL LOW (ref 22–32)
Calcium: 9.7 mg/dL (ref 8.9–10.3)
Chloride: 110 mmol/L (ref 101–111)
Creatinine, Ser: 1.18 mg/dL (ref 0.61–1.24)
GFR calc Af Amer: >60 mL/min (ref >60)
GFR calc non Af Amer: >60 mL/min (ref >60)
Glucose, Bld: 114 mg/dL — ABNORMAL HIGH (ref 65–99)
Potassium: 4 mmol/L (ref 3.5–5.1)
Sodium: 139 mmol/L (ref 135–145)

## 2016-06-07 LAB — CBC
HCT: 44.6 % (ref 39.0–52.0)
Hemoglobin: 15 g/dL (ref 13.0–17.0)
MCH: 29.7 pg (ref 26.0–34.0)
MCHC: 33.6 g/dL (ref 30.0–36.0)
MCV: 88.3 fL (ref 78.0–100.0)
Platelets: 268 10*3/uL (ref 150–400)
RBC: 5.05 MIL/uL (ref 4.22–5.81)
RDW: 13.8 % (ref 11.5–15.5)
WBC: 5.7 10*3/uL (ref 4.0–10.5)

## 2016-06-07 LAB — DIGOXIN LEVEL: Digoxin Level: 0.3 ng/mL — ABNORMAL LOW (ref 0.8–2.0)

## 2016-06-07 LAB — BRAIN NATRIURETIC PEPTIDE: B Natriuretic Peptide: 20.1 pg/mL (ref 0.0–100.0)

## 2016-06-07 MED ORDER — FUROSEMIDE 20 MG PO TABS: 20.0000 mg | ORAL_TABLET | ORAL | Status: DC

## 2016-06-07 NOTE — Patient Instructions (Signed)
START lasix 20 mg tablet every other morning.  Routine lab work today. Will notify you of abnormal results, otherwise no news is good news!  Return in 2 weeks for labs.  Follow up with Dr. Aundra Dubin in 2 months.  Do the following things EVERYDAY: 1) Weigh yourself in the morning before breakfast. Write it down and keep it in a log. 2) Take your medicines as prescribed 3) Eat low salt foods-Limit salt (sodium) to 2000 mg per day.  4) Stay as active as you can everyday 5) Limit all fluids for the day to less than 2 liters

## 2016-06-07 NOTE — Progress Notes (Addendum)
[facility] medical record request and form dated 05/11/16 completed by Dr. Aundra Dubin. Patient states during today's OV that he feels he is still unable to return to work at this time due to dizziness, SOB, and fatigue with min-mod exertion (10 minutes yard work, 2 flights stairs). 19 pages faxed to provided # [phone] Copy of request scanned into patient's electronic medical record. Case # [id number]  [name]

## 2016-06-07 NOTE — Progress Notes (Signed)
Patient ID: Isaiah Wu, male   DOB: 03/19/1951, 65 y.o.   MRN: [id number] PCP: Dr. Rex Kras HF Cardiology: Dr. Aundra Dubin  65 yo with minimal PMH diagnosed with systolic HF in [id number].Echo showed EF 15% with diffuse hypokinesis.  Initially seen by Dr. [name]. He was admitted to [facility] in 4/16 for diuresis.  RHC/LHC showed elevated filling pressures and no significant coronary disease.  Underwent St Jude BiV-ICD on 07/21/15.  Repeat echo in 2/17 showed EF up to 40-45%.    Isaiah Wu feels a litte worse than at last appointment.  Mild lightheadedness in the evenings persists without much change.  It is not limiting.  He can walk a mile but then tires.  Does this at least 3 times a week.  He averages about 3 miles/day on his Fitbit.  Notes that yardwork is taking him longer for the last couple of months, wears out more easily. No chest pain, orthopnea, PND, palpitations.    Corevue reviewed today: impedance has been lower starting in 4/17 suggesting some increased volume, >99% BiV pacing, no atrial fibrillation.   Labs 4/16: TSH normal, SPEP negative, ferritin normal, HIV negative, K 4.6, creatinine 1.22, HCT 46.9 Labs 6/16: K 3.8, Creatinine 1.08 Labs 07/18/15: K 4.1, Creatinine 0.99 Labs 9/16: digoxin 0.3, K 4.3, creatinine 1.03 Labs 11/16: K 4.3, creatinine 1.09, digoxin 0.6, BNP 23 Labs 2/17: K 4.2, creatinine 1.13, BNP 15.9, digoxin 0.4 Labs 3/17: K 4.2, creatinine 1.09, LDL 65  PMH: 1. HTN 2. GERD 3. LBBB 4. Cardiomyopathy: Nonischemic.  Echo (4/16) with EF 15%, severe diffuse hypokinesis [name]).  RHC/LHC (4/16) with normal coronaries; mean RA 10, PA 58/35 mean 48, mean PCWP 32, no comment on CO.  HIV negative, TSH normal, ferritin normal, SPEP normal. S/p St Jude CRT-D 07/21/15 cMRI  04/2015 with severely dilated LV, EF 14%, septal-lateral dyssynchrony (prominent), normal RV size with mild to moderately decreased systolic function, at least moderate functional mitral regurgitation, there was basal  inferoseptal subepicardial LGE (small area) and apical septal subtle mid-wall LGE (small area).  Echo (9/16) with EF 25-30%, mild Isaiah => improved. CPX (1/17) with VE/VCO2 26.2, peak VO2 20.3, RER 1.13 => mild heart failure limitation.  Echo (2/17) EF 40-45%.   5. Syncopal episode 6/16: Monitor ok. Suspect orthostasis.    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  No cardiomyopathy or sudden death.   ROS: All systems reviewed and negative except as per HPI.   Current Outpatient Prescriptions  Medication Sig Dispense Refill  . carvedilol (COREG) 6.25 MG tablet Take 1.5 tablets (9.375 mg total) by mouth 2 (two) times daily with a meal. 90 tablet 3  . digoxin (LANOXIN) 0.125 MG tablet Take 1 tablet (0.125 mg total) by mouth daily. 30 tablet 12  . sacubitril-valsartan (ENTRESTO) 49-51 MG Take 1 tablet by mouth 2 (two) times daily. 60 tablet 11  . spironolactone (ALDACTONE) 25 MG tablet Take 1 tablet (25 mg total) by mouth daily. 30 tablet 12  . furosemide (LASIX) 20 MG tablet Take 1 tablet (20 mg total) by mouth every other day. 45 tablet 3   No current facility-administered medications for this encounter.   BP 112/72 mmHg  Pulse 62  Wt 207 lb (93.895 kg)  SpO2 99% General: NAD Neck: No JVD, no thyromegaly or thyroid nodule.  Lungs: Clear to auscultation bilaterally with normal respiratory effort. CV: Nondisplaced PMI.  Heart regular S1/S2, no S3/S4, no murmur. No peripheral edema.  No carotid bruit.  Normal pedal pulses.  Abdomen: Soft, nontender, no hepatosplenomegaly, mild distention.  Skin: Intact without lesions or rashes.  Neurologic: Alert and oriented x 3.  Psych: Normal affect. Extremities: No clubbing or cyanosis.  HEENT: Normal.   Assessment/Plan:  1. Chronic systolic CHF: Nonischemic cardiomyopathy, EF 15% initially, up to 40-45% with St Jude CRT-D. Etiology of  cardiomyopathy is uncertain: normal coronaries, no symptoms suggestive of viral syndrome prior to admission, HIV/Ferritin/SPEP/TSH unremarkable.  No history of familial CMP.  He has LBBB of uncertain duration.  ? LBBB cardiomyopathy.  However, there was also mid-wall LGE in the septum on cardiac MRI, suggesting possibility of myocarditis.  CPX in 1/17 suggested only a mild heart failure limitation.  NYHA class II but a bit more symptomatic than in the past.  He looks euvolemic on exam and weight is stable, but Corevue is suggestive of increased volume.   - Use Lasix 20 mg every other day to see if this helps his symptoms.  BMET in 2 wks.     - Continue spironolactone 25 mg daily and digoxin 0.125 mg daily.  BMET and digoxin level today.  - Continue Coreg 9.375 mg bid, I will not increase given his occasional orthostatic-type symptoms.    - Continue Entresto 49/51 bid.  - Given some evening LH, asked him to move spironolactone to the late evening rather than the morning.  2. LBBB: Of uncertain duration.    Followup in 2 months.   Brienne Liguori,MD 06/07/2016

## 2016-06-15 ENCOUNTER — Encounter: Payer: Self-pay | Admitting: Cardiology

## 2016-06-28 ENCOUNTER — Ambulatory Visit (HOSPITAL_COMMUNITY)
Admission: RE | Admit: 2016-06-28 | Discharge: 2016-06-28 | Disposition: A | Discharge status: Home / Self Care | Payer: Self-pay | Source: Ambulatory Visit | Attending: Cardiology | Admitting: Cardiology

## 2016-06-28 DIAGNOSIS — I5022 Chronic systolic (congestive) heart failure: Secondary | ICD-10-CM | POA: Insufficient documentation

## 2016-06-28 LAB — BASIC METABOLIC PANEL
Anion gap: 9 (ref 5–15)
BUN: 15 mg/dL (ref 6–20)
CO2: 23 mmol/L (ref 22–32)
Calcium: 9.7 mg/dL (ref 8.9–10.3)
Chloride: 105 mmol/L (ref 101–111)
Creatinine, Ser: 1.19 mg/dL (ref 0.61–1.24)
GFR calc Af Amer: >60 mL/min (ref >60)
GFR calc non Af Amer: >60 mL/min (ref >60)
Glucose, Bld: 108 mg/dL — ABNORMAL HIGH (ref 65–99)
Potassium: 4.3 mmol/L (ref 3.5–5.1)
Sodium: 137 mmol/L (ref 135–145)

## 2016-07-21 DIAGNOSIS — Z736 Limitation of activities due to disability: Secondary | ICD-10-CM

## 2016-08-12 NOTE — Progress Notes (Signed)
ode     Patient Care Team: Hulan Fess, MD as PCP - General (Family Medicine) Jacolyn Reedy, MD as Consulting Physician (Cardiology)   HPI  Other Schnitzer is a 65 y.o. male Seen in followup for CRT- ICD implanted for NICM LBBB and hx of syncope  Following CRT implantation he had significant improvement in exercise tolerance. He has not noted any further improvement over recent months.  He notes a fair amount of energy and begin the day and loses his at the middle of the day. He brings in blood pressure records from home and a.m. systolic blood pressures before medications are 99-120.  No edema nocturnal dyspnea or orthopnea.  Echo (4/16)  EF 15%, severe diffuse hypokinesis [name]).  RHC/LHC (4/16) with normal coronaries; mean RA 10, PA 58/35 mean 48, mean PCWP 32,   cMRI 04/2015 with severely dilated LV, EF 14%, septal-lateral dyssynchrony (prominent), normal RV size with mild to moderately decreased systolic function, at least moderate functional mitral regurgitation, there was basal inferoseptal subepicardial LGE (small area) and apical septal subtle mid-wall LGE (small area).  Echo (9/16) with EF 25-30%, mild MR => improved. Echo (2/17)  EF 40-45%   Device History: STJ CRTD implanted 2016 for NICM, CHF, LBBB History of appropriate therapy: No History of AAD therapy: No     Past Medical History:  Diagnosis Date  . AICD (automatic cardioverter/defibrillator) present   . Cardiomyopathy ([place]) 04/07/2015  . Chronic systolic CHF (congestive heart failure) ([place]) 04/14/2015  . GERD (gastroesophageal reflux disease)   . Hypertension   . LBBB (left bundle branch block)   . Syncope 05/30/2015    Past Surgical History:  Procedure Laterality Date  . CARDIAC CATHETERIZATION    . EP IMPLANTABLE DEVICE N/A 07/21/2015   Procedure: BiV ICD Insertion CRT-D;  Surgeon: Deboraha Sprang, MD;  Location: [facility];  Service: Cardiovascular;  Laterality: N/A;  . FRACTURE SURGERY     . LEFT AND RIGHT HEART CATHETERIZATION WITH CORONARY ANGIOGRAM N/A 04/09/2015   Procedure: LEFT AND RIGHT HEART CATHETERIZATION WITH CORONARY ANGIOGRAM;  Surgeon: Jacolyn Reedy, MD;  Location: [facility] CATH LAB;  Service: Cardiovascular;  Laterality: N/A;  . ORIF ELBOW FRACTURE Left 1981  . SHOULDER SURGERY Left 05/2008   "had to put cadavear bone in and reattach muscle to it"  . TONSILLECTOMY AND ADENOIDECTOMY  1960's    Current Outpatient Prescriptions  Medication Sig Dispense Refill  . carvedilol (COREG) 6.25 MG tablet Take 1.5 tablets (9.375 mg total) by mouth 2 (two) times daily with a meal. 90 tablet 3  . digoxin (LANOXIN) 0.125 MG tablet Take 1 tablet (0.125 mg total) by mouth daily. 30 tablet 12  . furosemide (LASIX) 20 MG tablet Take 1 tablet (20 mg total) by mouth every other day. 45 tablet 3  . sacubitril-valsartan (ENTRESTO) 49-51 MG Take 1 tablet by mouth 2 (two) times daily. 60 tablet 11  . spironolactone (ALDACTONE) 25 MG tablet Take 1 tablet (25 mg total) by mouth daily. 30 tablet 12   No current facility-administered medications for this visit.     No Known Allergies    Review of Systems negative except from HPI and PMH  Physical Exam BP 110/62   Pulse 63   Ht 6\' 1"  (1.854 m)   Wt 208 lb 6.4 oz (94.5 kg)   SpO2 96%   BMI 27.50 kg/m  Well developed and well nourished in no acute distress HENT normal E scleral and icterus clear Neck  Supple JVP flat; carotids brisk and full Clear to ausculation.dp Device pocket well healed; without hematoma or erythema.  There is no tethering Regular rate and rhythm, no murmurs gallops or rub Soft with active bowel sounds No clubbing cyanosis  Edema Alert and oriented, grossly normal motor and sensory function Skin Warm and Dry   ECG  P synchronous pacing with a R configuration in lead V1 and a QS  configuration lead 1  Assessment and  Plan  Nonischemic cardiomyopathy  Congestive heart failure class  2-3  CRT-D  Hypotension  Orthostatic lightheadedness   Patient continues to do quite well. There is no evidence of volume overload today.  I wonder whether some of his daytime fatigue coming after his medications isn't related to hypotension. His baseline a.m. blood pressures are relatively low, 100-110 range, I have asked that he try and reduce his a.m. dose in anticipation of his visit with Dr. DM on Monday 9.375 carvedilol--3.125. The hope would be that the midday lassitude would be mitigated.   Orthostatic symptoms are significantly diminished with him take his Aldactone at night.  In the event that Dr. DM anticipates a follow-up echo 6 months after his last one to look for further interval improvement in LVEF, I would ask that we do it at [facility] so that we could simultaneously look at AV optimization.

## 2016-08-13 ENCOUNTER — Encounter: Payer: Self-pay | Admitting: Internal Medicine

## 2016-08-13 ENCOUNTER — Ambulatory Visit (INDEPENDENT_AMBULATORY_CARE_PROVIDER_SITE_OTHER): Payer: Self-pay | Admitting: Internal Medicine

## 2016-08-13 VITALS — BP 110/62 | HR 63 | Ht 73.0 in | Wt 208.4 lb

## 2016-08-13 DIAGNOSIS — Z95810 Presence of automatic (implantable) cardiac defibrillator: Secondary | ICD-10-CM

## 2016-08-13 DIAGNOSIS — I5022 Chronic systolic (congestive) heart failure: Secondary | ICD-10-CM

## 2016-08-13 DIAGNOSIS — I429 Cardiomyopathy, unspecified: Secondary | ICD-10-CM

## 2016-08-13 DIAGNOSIS — I428 Other cardiomyopathies: Secondary | ICD-10-CM

## 2016-08-13 LAB — CUP PACEART INCLINIC DEVICE CHECK
Battery Remaining Longevity: 60 mo
Brady Statistic RA Percent Paced: 23 %
Brady Statistic RV Percent Paced: 99.84 %
Date Time Interrogation Session: 20170825102349
HighPow Impedance: 76.5 Ohm
Implantable Lead Implant Date: 20160801
Implantable Lead Implant Date: 20160801
Implantable Lead Implant Date: 20160801
Implantable Lead Location: 753858
Implantable Lead Location: 753859
Implantable Lead Location: 753860
Implantable Lead Model: 7122
Lead Channel Impedance Value: 1100 Ohm
Lead Channel Impedance Value: 412.5 Ohm
Lead Channel Impedance Value: 462.5 Ohm
Lead Channel Pacing Threshold Amplitude: 0.75 V
Lead Channel Pacing Threshold Amplitude: 1.25 V
Lead Channel Pacing Threshold Amplitude: 2.25 V
Lead Channel Pacing Threshold Amplitude: 2.25 V
Lead Channel Pacing Threshold Pulse Width: 0.5 ms
Lead Channel Pacing Threshold Pulse Width: 0.5 ms
Lead Channel Pacing Threshold Pulse Width: 0.5 ms
Lead Channel Pacing Threshold Pulse Width: 0.5 ms
Lead Channel Sensing Intrinsic Amplitude: 11.7 mV
Lead Channel Sensing Intrinsic Amplitude: 3.7 mV
Lead Channel Setting Pacing Amplitude: 1.75 V
Lead Channel Setting Pacing Amplitude: 2.5 V
Lead Channel Setting Pacing Amplitude: 2.5 V
Lead Channel Setting Pacing Pulse Width: 0.5 ms
Lead Channel Setting Pacing Pulse Width: 0.5 ms
Lead Channel Setting Sensing Sensitivity: 0.5 mV
Pulse Gen Serial Number: 7290135

## 2016-08-13 MED ORDER — CARVEDILOL 6.25 MG PO TABS: ORAL_TABLET | ORAL | Status: DC

## 2016-08-13 NOTE — Patient Instructions (Addendum)
Medication Instructions: - Your physician has recommended you make the following change in your medication:  1) Decrease coreg (carvedilol) to 3.125 mg in the morning- leave your evening dose at 1 & 1/2 tablets (9.375 mg)  Labwork: - none  Procedures/Testing: - none  Follow-Up: - Remote monitoring is used to monitor your Pacemaker of ICD from home. This monitoring reduces the number of office visits required to check your device to one time per year. It allows [place] to keep an eye on the functioning of your device to ensure it is working properly. You are scheduled for a device check from home on 11/16/16. You may send your transmission at any time that day. If you have a wireless device, the transmission will be sent automatically. After your physician reviews your transmission, you will receive a postcard with your next transmission date.  - Your physician wants you to follow-up in: 1 year with Chanetta Marshall, NP for Dr. Caryl Comes. You will receive a reminder letter in the mail two months in advance. If you don't receive a letter, please call our office to schedule the follow-up appointment.   Any Additional Special Instructions Will Be Listed Below (If Applicable).     If you need a refill on your cardiac medications before your next appointment, please call your pharmacy.

## 2016-08-16 ENCOUNTER — Encounter (HOSPITAL_COMMUNITY): Payer: Self-pay

## 2016-08-16 ENCOUNTER — Ambulatory Visit (HOSPITAL_COMMUNITY)
Admission: RE | Admit: 2016-08-16 | Discharge: 2016-08-16 | Disposition: A | Discharge status: Home / Self Care | Payer: Self-pay | Source: Ambulatory Visit | Attending: Cardiology | Admitting: Cardiology

## 2016-08-16 VITALS — BP 114/72 | HR 59 | Ht 73.0 in | Wt 207.0 lb

## 2016-08-16 DIAGNOSIS — I110 Hypertensive heart disease with heart failure: Secondary | ICD-10-CM | POA: Insufficient documentation

## 2016-08-16 DIAGNOSIS — I428 Other cardiomyopathies: Secondary | ICD-10-CM | POA: Insufficient documentation

## 2016-08-16 DIAGNOSIS — Z79899 Other long term (current) drug therapy: Secondary | ICD-10-CM | POA: Insufficient documentation

## 2016-08-16 DIAGNOSIS — R5383 Other fatigue: Secondary | ICD-10-CM | POA: Insufficient documentation

## 2016-08-16 DIAGNOSIS — I447 Left bundle-branch block, unspecified: Secondary | ICD-10-CM | POA: Insufficient documentation

## 2016-08-16 DIAGNOSIS — Z87891 Personal history of nicotine dependence: Secondary | ICD-10-CM | POA: Insufficient documentation

## 2016-08-16 DIAGNOSIS — K219 Gastro-esophageal reflux disease without esophagitis: Secondary | ICD-10-CM | POA: Insufficient documentation

## 2016-08-16 DIAGNOSIS — I5022 Chronic systolic (congestive) heart failure: Secondary | ICD-10-CM | POA: Insufficient documentation

## 2016-08-16 DIAGNOSIS — Z8673 Personal history of transient ischemic attack (TIA), and cerebral infarction without residual deficits: Secondary | ICD-10-CM | POA: Insufficient documentation

## 2016-08-16 DIAGNOSIS — Z8249 Family history of ischemic heart disease and other diseases of the circulatory system: Secondary | ICD-10-CM | POA: Insufficient documentation

## 2016-08-16 LAB — BASIC METABOLIC PANEL
Anion gap: 9 (ref 5–15)
BUN: 15 mg/dL (ref 6–20)
CO2: 24 mmol/L (ref 22–32)
Calcium: 9.8 mg/dL (ref 8.9–10.3)
Chloride: 105 mmol/L (ref 101–111)
Creatinine, Ser: 1.15 mg/dL (ref 0.61–1.24)
GFR calc Af Amer: >60 mL/min (ref >60)
GFR calc non Af Amer: >60 mL/min (ref >60)
Glucose, Bld: 102 mg/dL — ABNORMAL HIGH (ref 65–99)
Potassium: 3.9 mmol/L (ref 3.5–5.1)
Sodium: 138 mmol/L (ref 135–145)

## 2016-08-16 LAB — DIGOXIN LEVEL: Digoxin Level: 0.5 ng/mL — ABNORMAL LOW (ref 0.8–2.0)

## 2016-08-16 MED ORDER — CARVEDILOL 6.25 MG PO TABS: ORAL_TABLET | ORAL | Status: DC

## 2016-08-16 NOTE — Patient Instructions (Signed)
Change Carvedilol dose to:  6.25 mg (1 tab) in AM and 9.375 mg (1 & 1/2 tabs) in PM  Labs today  Your physician has requested that you have an echocardiogram. Echocardiography is a painless test that uses sound waves to create images of your heart. It provides your doctor with information about the size and shape of your heart and how well your heart's chambers and valves are working. This procedure takes approximately one hour. There are no restrictions for this procedure.  [facility] OFFICE WILL CALL YOU TO SCHEDULE THIS.  We will contact you in 3 months to schedule your next appointment.

## 2016-08-16 NOTE — Progress Notes (Signed)
Patient ID: Isaiah Wu, male   DOB: Jan 08, 1951, 65 y.o.   MRN: [id number] PCP: Dr. Rex Kras HF Cardiology: Dr. Aundra Dubin  65 yo with minimal PMH diagnosed with systolic HF in [id number].Echo showed EF 15% with diffuse hypokinesis.  Initially seen by Dr. [name]. He was admitted to [facility] in 4/16 for diuresis.  RHC/LHC showed elevated filling pressures and no significant coronary disease.  Underwent St Jude BiV-ICD on 07/21/15.  Repeat echo in 2/17 showed EF up to 40-45%.    He is no longer lightheaded since moving spironolactone to the evening.  Feel good in the morning but tires/fatigues as the day goes on.  He is tired after about 20 minutes of yardwork.  Able to mow his grass with self-propelled lawnmower.  He can walk 1-2 miles without dyspnea. No chest pain, orthopnea, PND, palpitations.  Weight is stable.   Labs 4/16: TSH normal, SPEP negative, ferritin normal, HIV negative, K 4.6, creatinine 1.22, HCT 46.9 Labs 6/16: K 3.8, Creatinine 1.08 Labs 07/18/15: K 4.1, Creatinine 0.99 Labs 9/16: digoxin 0.3, K 4.3, creatinine 1.03 Labs 11/16: K 4.3, creatinine 1.09, digoxin 0.6, BNP 23 Labs 2/17: K 4.2, creatinine 1.13, BNP 15.9, digoxin 0.4 Labs 3/17: K 4.2, creatinine 1.09, LDL 65 Labs 6/17: digoxin 0.3 Labs 7/17: K 4.3, creatinine 1.19  PMH: 1. HTN 2. GERD 3. LBBB 4. Cardiomyopathy: Nonischemic.  Echo (4/16) with EF 15%, severe diffuse hypokinesis [name]).  RHC/LHC (4/16) with normal coronaries; mean RA 10, PA 58/35 mean 48, mean PCWP 32, no comment on CO.  HIV negative, TSH normal, ferritin normal, SPEP normal. S/p St Jude CRT-D 07/21/15.  - cMRI  04/2015 with severely dilated LV, EF 14%, septal-lateral dyssynchrony (prominent), normal RV size with mild to moderately decreased systolic function, at least moderate functional mitral regurgitation, there was basal inferoseptal subepicardial LGE (small area) and apical septal subtle mid-wall LGE (small area).  - Echo (9/16) with EF 25-30%, mild MR => improved.   - CPX (1/17) with VE/VCO2 26.2, peak VO2 20.3, RER 1.13 => mild heart failure limitation.   - Echo (2/17) EF 40-45%.   5. Syncopal episode 6/16: Monitor ok. Suspect orthostasis.    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  No cardiomyopathy or sudden death.   ROS: All systems reviewed and negative except as per HPI.   Current Outpatient Prescriptions  Medication Sig Dispense Refill  . carvedilol (COREG) 6.25 MG tablet Take 1 tablet (6.25 mg) in the morning and 1 & 1/2 tablets (9.375) in the evening    . digoxin (LANOXIN) 0.125 MG tablet Take 1 tablet (0.125 mg total) by mouth daily. 30 tablet 12  . furosemide (LASIX) 20 MG tablet Take 1 tablet (20 mg total) by mouth every other day. 45 tablet 3  . sacubitril-valsartan (ENTRESTO) 49-51 MG Take 1 tablet by mouth 2 (two) times daily. 60 tablet 11  . spironolactone (ALDACTONE) 25 MG tablet Take 1 tablet (25 mg total) by mouth daily. 30 tablet 12   No current facility-administered medications for this encounter.    BP 114/72 (BP Location: Right Arm, Patient Position: Sitting, Cuff Size: Normal)   Pulse (!) 59   Ht 6\' 1"  (1.854 m)   Wt 207 lb (93.9 kg)   SpO2 97%   BMI 27.31 kg/m  General: NAD Neck: No JVD, no thyromegaly or thyroid nodule.  Lungs: Clear to auscultation bilaterally  with normal respiratory effort. CV: Nondisplaced PMI.  Heart regular S1/S2, no S3/S4, no murmur. No peripheral edema.  No carotid bruit.  Normal pedal pulses.  Abdomen: Soft, nontender, no hepatosplenomegaly, mild distention.  Skin: Intact without lesions or rashes.  Neurologic: Alert and oriented x 3.  Psych: Normal affect. Extremities: No clubbing or cyanosis.  HEENT: Normal.   Assessment/Plan:  1. Chronic systolic CHF: Nonischemic cardiomyopathy, EF 15% initially, up to 40-45% with St Jude CRT-D. Etiology of cardiomyopathy is uncertain: normal  coronaries, no symptoms suggestive of viral syndrome prior to admission, HIV/Ferritin/SPEP/TSH unremarkable.  No history of familial CMP.  He has LBBB of uncertain duration.  ? LBBB cardiomyopathy.  However, there was also mid-wall LGE in the septum on cardiac MRI, suggesting possibility of myocarditis.  CPX in 1/17 suggested only a mild heart failure limitation.  NYHA class II but fatigues easily.  He looks euvolemic on exam and weight is stable. - Continue Lasix every other day. BMET today.    - Continue spironolactone 25 mg daily and digoxin 0.125 mg daily.  Check digoxin level today.  - Given prominent fatigue, I will try decreasing Coreg to 6.25 qam/9.375 mg qpm.      - Continue Entresto 49/51 bid.  - Arrange for echo at [facility] to allow AV optimization.   2. LBBB: Of uncertain duration.    Followup in 3 months.   Whitnie Deleon French Ana 08/16/2016

## 2016-08-24 ENCOUNTER — Encounter (HOSPITAL_COMMUNITY): Payer: Self-pay

## 2016-08-25 ENCOUNTER — Encounter (HOSPITAL_COMMUNITY): Payer: Self-pay

## 2016-08-25 ENCOUNTER — Other Ambulatory Visit (HOSPITAL_COMMUNITY): Payer: Self-pay

## 2016-08-25 NOTE — Progress Notes (Signed)
Permanent Disability Claim on behalf of patient completed, signed, and faxed to [place] along with supporting/requested documentation at provided # [id number] (28 pages total). Patient also picked up original copy of forms. Copy of forms scanned into patient's electronic medical record.  [name], RN

## 2016-09-09 ENCOUNTER — Other Ambulatory Visit: Payer: Self-pay

## 2016-09-09 ENCOUNTER — Encounter: Payer: Self-pay | Admitting: Internal Medicine

## 2016-09-09 ENCOUNTER — Ambulatory Visit (HOSPITAL_COMMUNITY): Payer: Self-pay | Attending: Internal Medicine

## 2016-09-09 DIAGNOSIS — I313 Pericardial effusion (noninflammatory): Secondary | ICD-10-CM | POA: Insufficient documentation

## 2016-09-09 DIAGNOSIS — I5022 Chronic systolic (congestive) heart failure: Secondary | ICD-10-CM | POA: Insufficient documentation

## 2016-09-09 DIAGNOSIS — I447 Left bundle-branch block, unspecified: Secondary | ICD-10-CM | POA: Insufficient documentation

## 2016-09-09 DIAGNOSIS — I428 Other cardiomyopathies: Secondary | ICD-10-CM | POA: Insufficient documentation

## 2016-09-09 DIAGNOSIS — I110 Hypertensive heart disease with heart failure: Secondary | ICD-10-CM | POA: Insufficient documentation

## 2016-09-11 IMAGING — CR DG CHEST 2V
2 series · 2 of 2 positions shown · non-contrast
Comparison: None.

CLINICAL DATA: 63-year-old male with AA systolic congestive heart
failure. Left bundle branch block. Cardiomyopathy. Initial
encounter.

EXAM:
CHEST  2 VIEW

[chest pa]
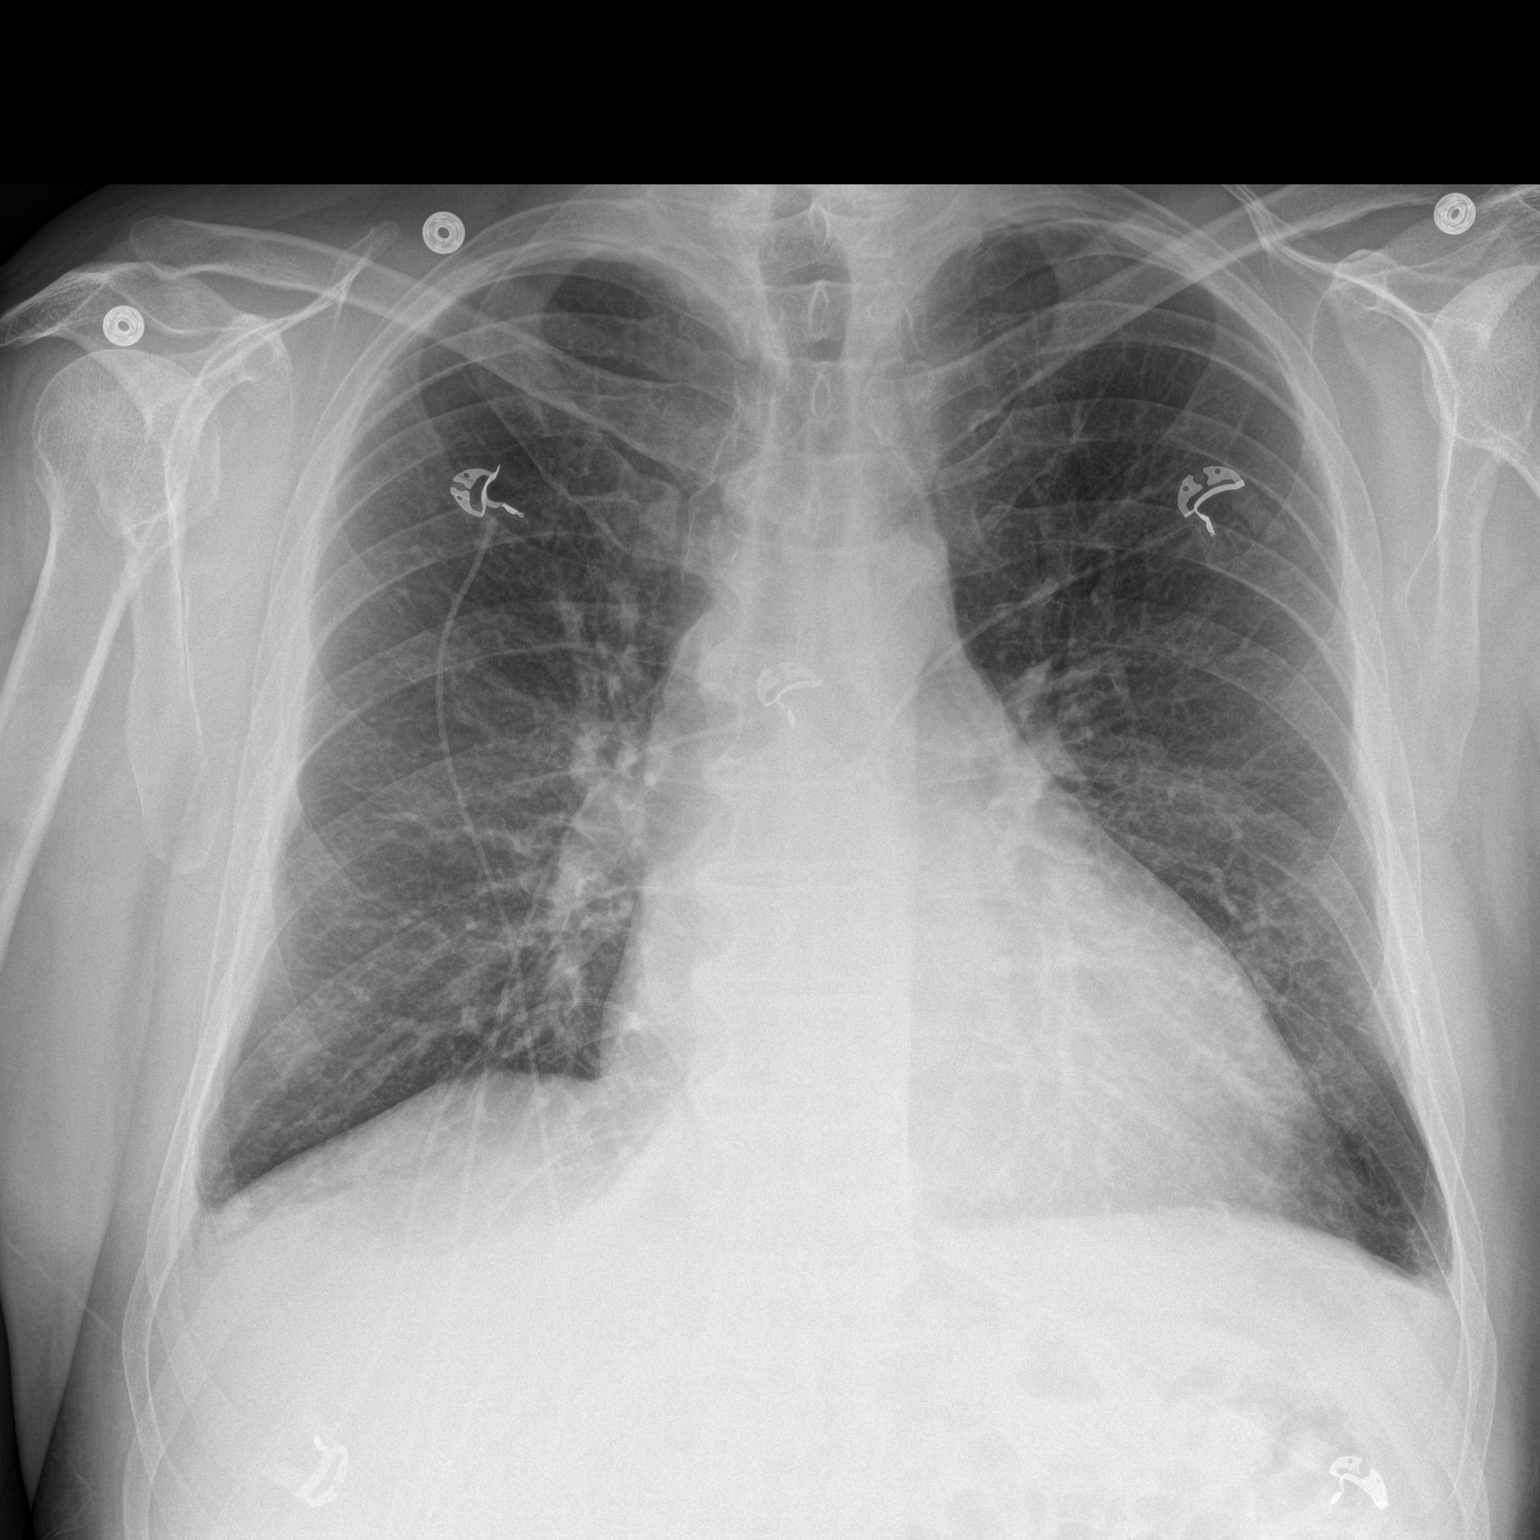

[chest lat]
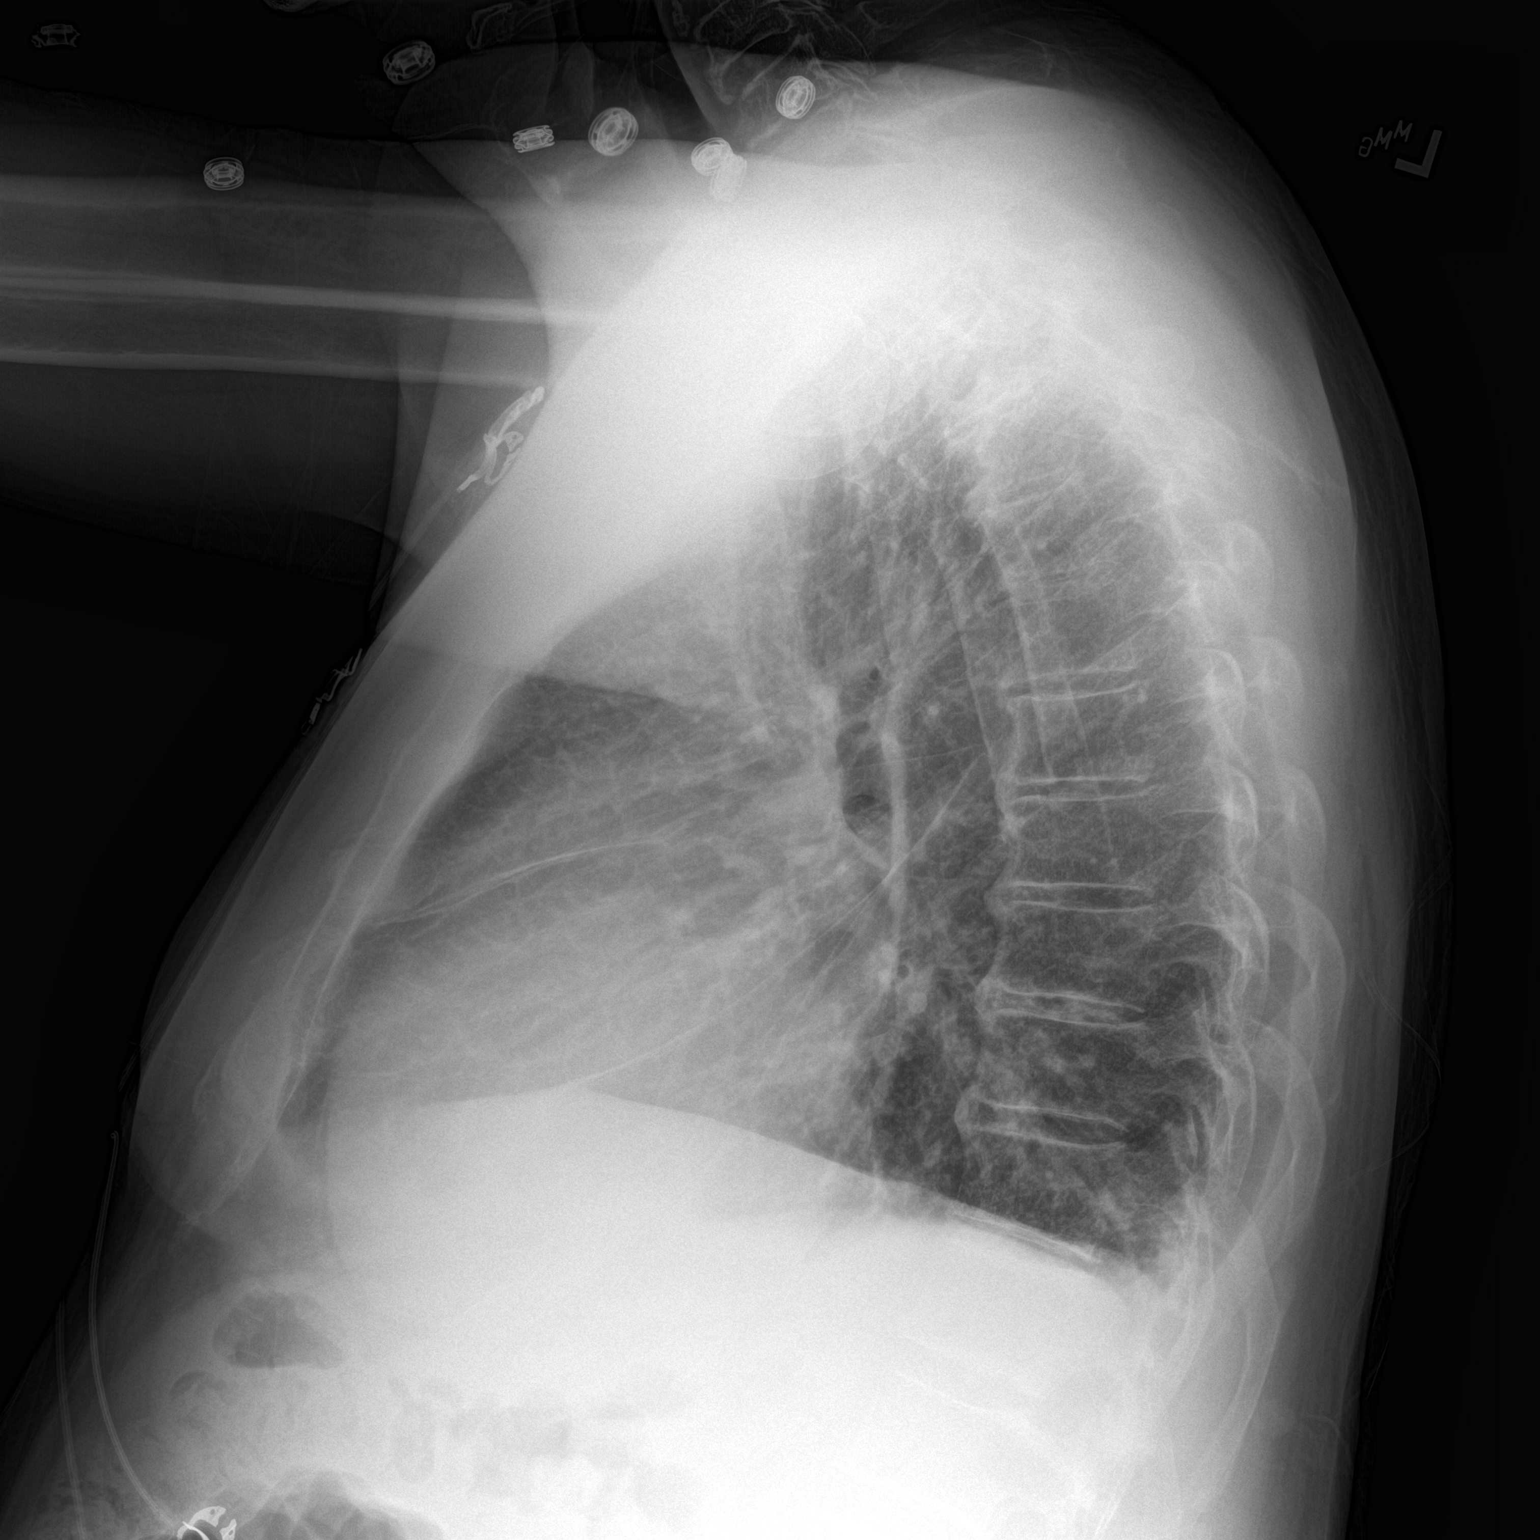

[2 of 2 positions shown; findings below may reference images not displayed]

FINDINGS: Mild to moderate cardiomegaly. Other mediastinal contours are within
normal limits. Visualized tracheal air column is within normal
limits. Evidence of small or trace bilateral pleural effusions.
Pulmonary vascularity is within normal limits, no acute edema. No
pneumothorax or consolidation. No acute osseous abnormality
identified.
IMPRESSION: Small or trace bilateral pleural effusions without acute edema. Mild
to moderate cardiomegaly.

## 2016-09-15 ENCOUNTER — Other Ambulatory Visit (HOSPITAL_COMMUNITY): Payer: Self-pay | Admitting: *Deleted

## 2016-09-15 MED ORDER — SACUBITRIL-VALSARTAN 49-51 MG PO TABS: 1.0000 | ORAL_TABLET | Freq: Two times a day (BID) | ORAL | 3 refills | Status: DC

## 2016-09-21 ENCOUNTER — Encounter (HOSPITAL_COMMUNITY): Payer: Self-pay

## 2016-10-04 ENCOUNTER — Encounter (HOSPITAL_COMMUNITY): Payer: Self-pay

## 2016-10-05 ENCOUNTER — Other Ambulatory Visit (HOSPITAL_COMMUNITY): Payer: Self-pay

## 2016-10-05 MED ORDER — SACUBITRIL-VALSARTAN 49-51 MG PO TABS: 1.0000 | ORAL_TABLET | Freq: Two times a day (BID) | ORAL | 11 refills | Status: DC

## 2016-10-07 ENCOUNTER — Encounter (HOSPITAL_COMMUNITY): Payer: Self-pay

## 2016-10-07 NOTE — Progress Notes (Signed)
[employer] faxed medical record request on behalf of patient dated 09/30/2016 for records 12/21/2015--present. All records from CHF clinic and associated providers (36 pages total) faxed to provided return fax # [phone]. Claim # [id number] Copy of request scanned into patient's electronic medical record.  [name], RN

## 2016-11-16 ENCOUNTER — Telehealth: Payer: Self-pay | Admitting: Cardiology

## 2016-11-16 ENCOUNTER — Encounter: Payer: Self-pay | Admitting: *Deleted

## 2016-11-16 NOTE — Telephone Encounter (Signed)
LMOVM reminding pt to send remote transmission.   

## 2016-11-18 ENCOUNTER — Ambulatory Visit (INDEPENDENT_AMBULATORY_CARE_PROVIDER_SITE_OTHER): Payer: Self-pay | Admitting: *Deleted

## 2016-11-18 DIAGNOSIS — I428 Other cardiomyopathies: Secondary | ICD-10-CM

## 2016-11-18 NOTE — Progress Notes (Signed)
Remote ICD transmission.   

## 2016-11-19 ENCOUNTER — Encounter: Payer: Self-pay | Admitting: Cardiology

## 2016-11-30 ENCOUNTER — Encounter: Payer: Self-pay | Admitting: Cardiology

## 2016-12-02 ENCOUNTER — Other Ambulatory Visit: Payer: Self-pay | Admitting: Cardiology

## 2016-12-29 LAB — CUP PACEART REMOTE DEVICE CHECK
Battery Remaining Longevity: 55 mo
Battery Remaining Percentage: 78 %
Battery Voltage: 2.96 V
Brady Statistic AP VP Percent: 40 %
Brady Statistic AP VS Percent: 1 %
Brady Statistic AS VP Percent: 60 %
Brady Statistic AS VS Percent: 1 %
Brady Statistic RA Percent Paced: 40 %
Date Time Interrogation Session: 20171130131904
HighPow Impedance: 72 Ohm
HighPow Impedance: 72 Ohm
Implantable Lead Implant Date: 20160801
Implantable Lead Implant Date: 20160801
Implantable Lead Implant Date: 20160801
Implantable Lead Location: 753858
Implantable Lead Location: 753859
Implantable Lead Location: 753860
Implantable Lead Model: 7122
Implantable Pulse Generator Implant Date: 20160801
Lead Channel Impedance Value: 1125 Ohm
Lead Channel Impedance Value: 410 Ohm
Lead Channel Impedance Value: 440 Ohm
Lead Channel Pacing Threshold Amplitude: 0.75 V
Lead Channel Pacing Threshold Amplitude: 1.25 V
Lead Channel Pacing Threshold Amplitude: 2 V
Lead Channel Pacing Threshold Pulse Width: 0.5 ms
Lead Channel Pacing Threshold Pulse Width: 0.5 ms
Lead Channel Pacing Threshold Pulse Width: 0.5 ms
Lead Channel Sensing Intrinsic Amplitude: 11.7 mV
Lead Channel Sensing Intrinsic Amplitude: 4.8 mV
Lead Channel Setting Pacing Amplitude: 1.75 V
Lead Channel Setting Pacing Amplitude: 2.5 V
Lead Channel Setting Pacing Amplitude: 2.5 V
Lead Channel Setting Pacing Pulse Width: 0.5 ms
Lead Channel Setting Pacing Pulse Width: 0.5 ms
Lead Channel Setting Sensing Sensitivity: 0.5 mV
Pulse Gen Serial Number: 7290135

## 2016-12-30 ENCOUNTER — Telehealth (HOSPITAL_COMMUNITY): Payer: Self-pay | Admitting: *Deleted

## 2016-12-30 NOTE — Telephone Encounter (Signed)
Pt called to report he has rash over most of his body, he states it's not really itchy, and looks "blotchy" he states his meds are all the same nothing new, denies using any new products either.  Advised to f/u w/pcp so someone can examine and eval, he is agreeable and will call their office

## 2017-02-22 ENCOUNTER — Telehealth (HOSPITAL_COMMUNITY): Payer: Self-pay

## 2017-02-22 NOTE — Telephone Encounter (Signed)
Left VM informing patient that patient assistance foundation paperwork for Malvin [employer] is ready to pick up at CHF clinic front desk.  [name], RN

## 2017-02-23 ENCOUNTER — Ambulatory Visit (INDEPENDENT_AMBULATORY_CARE_PROVIDER_SITE_OTHER): Payer: Self-pay | Admitting: *Deleted

## 2017-02-23 DIAGNOSIS — I428 Other cardiomyopathies: Secondary | ICD-10-CM

## 2017-02-23 NOTE — Progress Notes (Signed)
Remote ICD transmission.   

## 2017-02-24 ENCOUNTER — Encounter: Payer: Self-pay | Admitting: Cardiology

## 2017-02-25 LAB — CUP PACEART REMOTE DEVICE CHECK
Battery Remaining Longevity: 52 mo
Battery Remaining Percentage: 74 %
Battery Voltage: 2.96 V
Brady Statistic AP VP Percent: 44 %
Brady Statistic AP VS Percent: 1 %
Brady Statistic AS VP Percent: 56 %
Brady Statistic AS VS Percent: 1 %
Brady Statistic RA Percent Paced: 44 %
Date Time Interrogation Session: 20180307120129
HighPow Impedance: 71 Ohm
HighPow Impedance: 75 Ohm
Implantable Lead Implant Date: 20160801
Implantable Lead Implant Date: 20160801
Implantable Lead Implant Date: 20160801
Implantable Lead Location: 753858
Implantable Lead Location: 753859
Implantable Lead Location: 753860
Implantable Lead Model: 7122
Implantable Pulse Generator Implant Date: 20160801
Lead Channel Impedance Value: 1025 Ohm
Lead Channel Impedance Value: 410 Ohm
Lead Channel Impedance Value: 410 Ohm
Lead Channel Pacing Threshold Amplitude: 0.75 V
Lead Channel Pacing Threshold Amplitude: 1.25 V
Lead Channel Pacing Threshold Amplitude: 2 V
Lead Channel Pacing Threshold Pulse Width: 0.5 ms
Lead Channel Pacing Threshold Pulse Width: 0.5 ms
Lead Channel Pacing Threshold Pulse Width: 0.5 ms
Lead Channel Sensing Intrinsic Amplitude: 11.7 mV
Lead Channel Sensing Intrinsic Amplitude: 4.3 mV
Lead Channel Setting Pacing Amplitude: 1.75 V
Lead Channel Setting Pacing Amplitude: 2.5 V
Lead Channel Setting Pacing Amplitude: 2.5 V
Lead Channel Setting Pacing Pulse Width: 0.5 ms
Lead Channel Setting Pacing Pulse Width: 0.5 ms
Lead Channel Setting Sensing Sensitivity: 0.5 mV
Pulse Gen Serial Number: 7290135

## 2017-03-03 ENCOUNTER — Telehealth (HOSPITAL_COMMUNITY): Payer: Self-pay

## 2017-03-03 ENCOUNTER — Telehealth (HOSPITAL_COMMUNITY): Payer: Self-pay | Admitting: *Deleted

## 2017-03-03 MED ORDER — DIGOXIN 125 MCG PO TABS: 0.1250 mg | ORAL_TABLET | Freq: Every day | ORAL | 12 refills | Status: DC

## 2017-03-03 NOTE — Telephone Encounter (Signed)
Patient called in asking for refills for digoxin and also asking if he needed a follow up appointment.  I looked at his last office note and he is due for a follow up appointment.  Refill sent and follow up appointment scheduled.

## 2017-03-03 NOTE — Telephone Encounter (Signed)
Entresto approved through [employer] through 03/10/2018  Belia Heman, PharmD PGY1 Pharmacy Resident [phone] (Pager) 03/03/2017 9:55 AM

## 2017-03-17 IMAGING — DX DG CHEST 2V
2 series · 2 of 2 positions shown · non-contrast
Comparison: PA and lateral chest x-ray April 08, 2015

CLINICAL DATA: Status post permanent pacemaker placement

EXAM:
CHEST  2 VIEW

[chest pa]
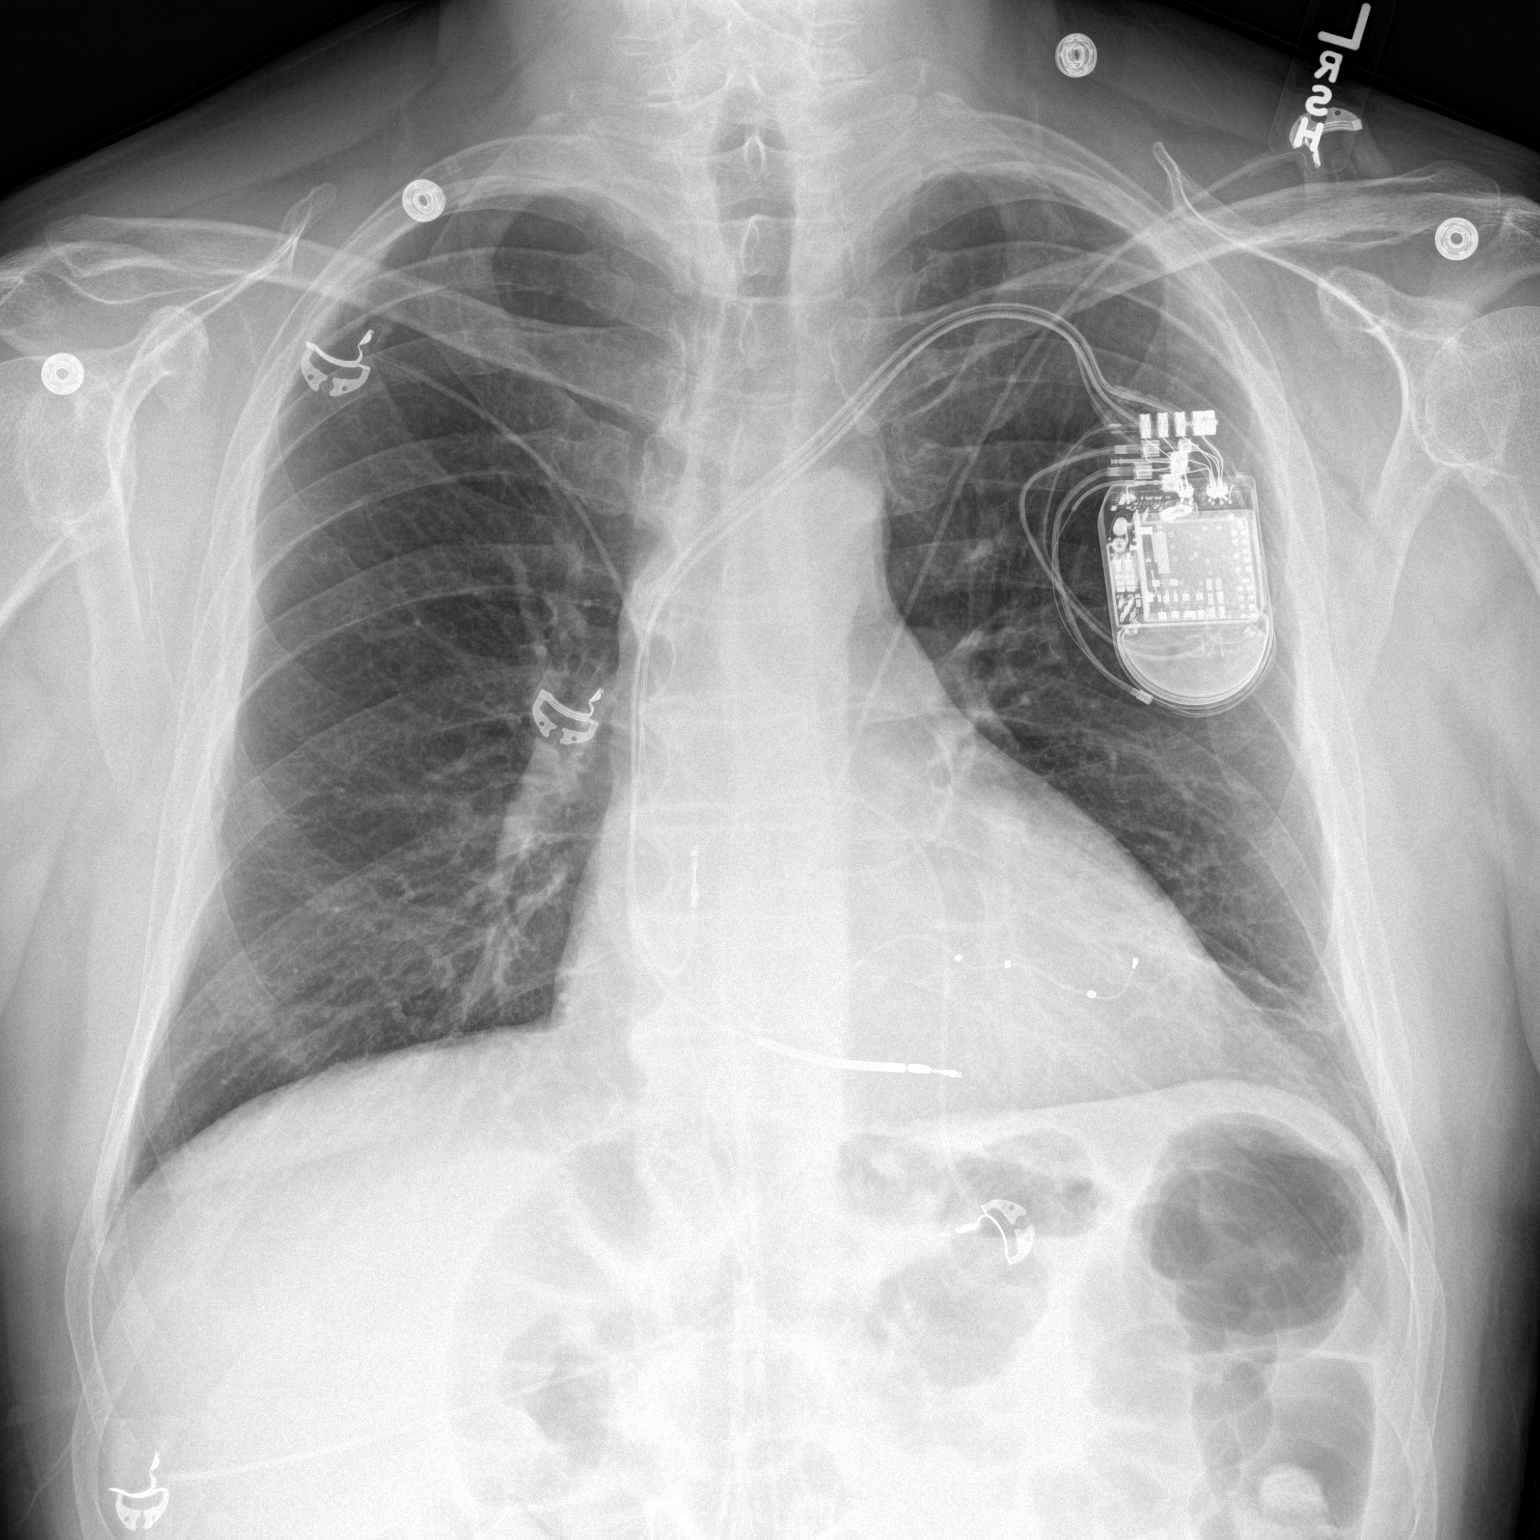

[chest lat]
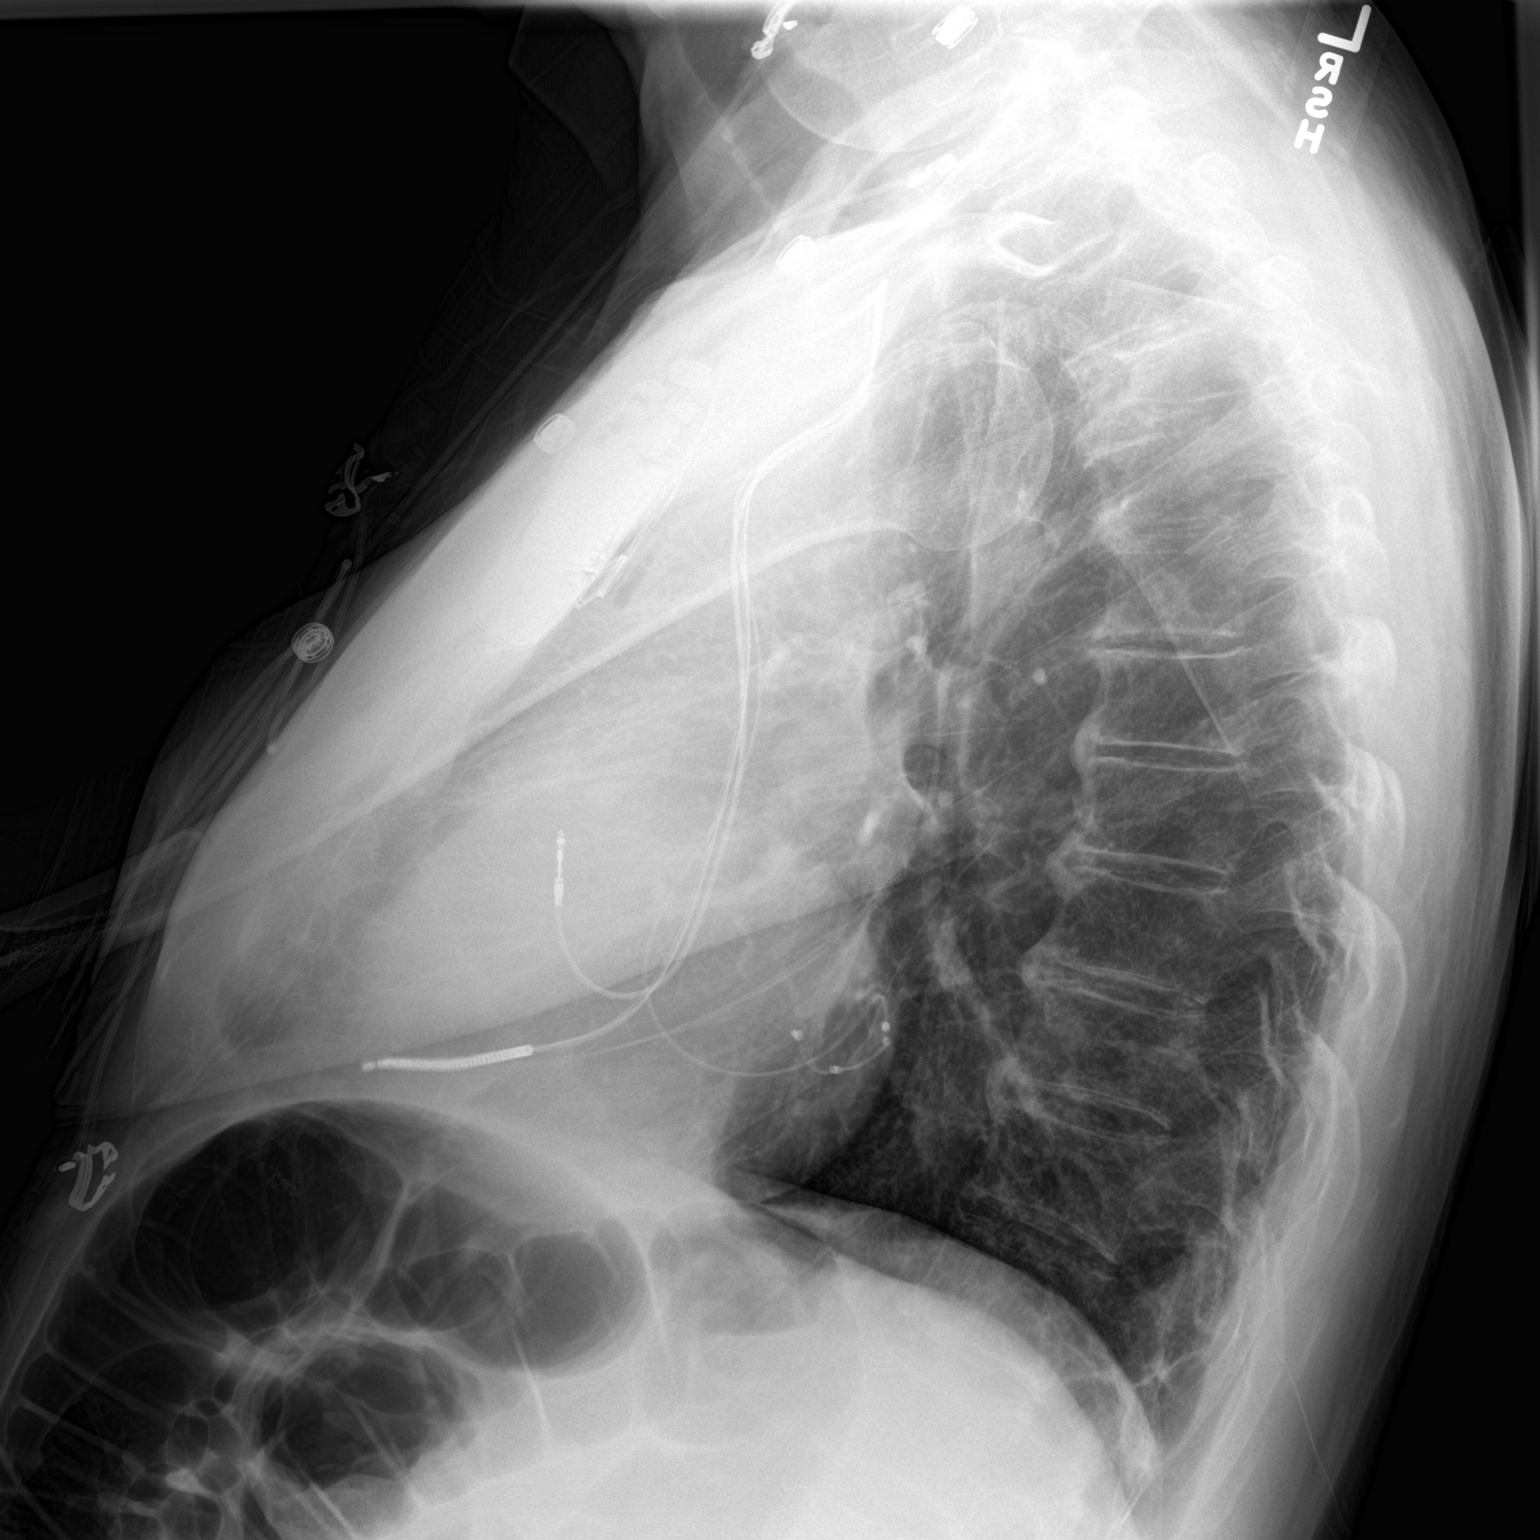

[2 of 2 positions shown; findings below may reference images not displayed]

FINDINGS: The lungs are well-expanded. There is no pneumothorax or pleural
effusion. There is subsegmental atelectasis adjacent to the cardiac
apex. The heart is top-normal in size but stable. The pulmonary
vascularity is normal. The permanent pacemaker defibrillator is in
reasonable position. The bony thorax exhibits no acute abnormality.
IMPRESSION: There is no immediate postprocedure complication. There is minimal
subsegmental atelectasis overlying the cardiac apex.

## 2017-04-13 ENCOUNTER — Other Ambulatory Visit: Payer: Self-pay | Admitting: Cardiology

## 2017-04-14 ENCOUNTER — Ambulatory Visit (HOSPITAL_COMMUNITY)
Admission: RE | Admit: 2017-04-14 | Discharge: 2017-04-14 | Disposition: A | Discharge status: Home / Self Care | Payer: Self-pay | Source: Ambulatory Visit | Attending: Cardiology | Admitting: Cardiology

## 2017-04-14 ENCOUNTER — Encounter (HOSPITAL_COMMUNITY): Payer: Self-pay

## 2017-04-14 VITALS — BP 128/84 | HR 82 | Wt 217.2 lb

## 2017-04-14 DIAGNOSIS — Z87891 Personal history of nicotine dependence: Secondary | ICD-10-CM | POA: Insufficient documentation

## 2017-04-14 DIAGNOSIS — I5022 Chronic systolic (congestive) heart failure: Secondary | ICD-10-CM | POA: Insufficient documentation

## 2017-04-14 DIAGNOSIS — I447 Left bundle-branch block, unspecified: Secondary | ICD-10-CM | POA: Insufficient documentation

## 2017-04-14 DIAGNOSIS — Z823 Family history of stroke: Secondary | ICD-10-CM | POA: Insufficient documentation

## 2017-04-14 DIAGNOSIS — I110 Hypertensive heart disease with heart failure: Secondary | ICD-10-CM | POA: Insufficient documentation

## 2017-04-14 DIAGNOSIS — K219 Gastro-esophageal reflux disease without esophagitis: Secondary | ICD-10-CM | POA: Insufficient documentation

## 2017-04-14 DIAGNOSIS — I429 Cardiomyopathy, unspecified: Secondary | ICD-10-CM | POA: Insufficient documentation

## 2017-04-14 LAB — BASIC METABOLIC PANEL
Anion gap: 11 (ref 5–15)
BUN: 15 mg/dL (ref 6–20)
CO2: 21 mmol/L — ABNORMAL LOW (ref 22–32)
Calcium: 9.2 mg/dL (ref 8.9–10.3)
Chloride: 104 mmol/L (ref 101–111)
Creatinine, Ser: 1.24 mg/dL (ref 0.61–1.24)
GFR calc Af Amer: >60 mL/min (ref >60)
GFR calc non Af Amer: >60 mL/min (ref >60)
Glucose, Bld: 108 mg/dL — ABNORMAL HIGH (ref 65–99)
Potassium: 4.2 mmol/L (ref 3.5–5.1)
Sodium: 136 mmol/L (ref 135–145)

## 2017-04-14 LAB — DIGOXIN LEVEL: Digoxin Level: 0.4 ng/mL — ABNORMAL LOW (ref 0.8–2.0)

## 2017-04-14 LAB — LIPID PANEL
Cholesterol: 131 mg/dL (ref 0–200)
HDL: 43 mg/dL (ref >40)
LDL Cholesterol: 71 mg/dL (ref 0–99)
Total CHOL/HDL Ratio: 3 RATIO
Triglycerides: 86 mg/dL (ref <150)
VLDL: 17 mg/dL (ref 0–40)

## 2017-04-14 MED ORDER — SPIRONOLACTONE 25 MG PO TABS: 25.0000 mg | ORAL_TABLET | Freq: Every day | ORAL | 12 refills | Status: DC

## 2017-04-14 NOTE — Patient Instructions (Signed)
Stop Digoxin  Labs today  We will contact you in 6 months to schedule your next appointment and echocardiogram

## 2017-04-14 NOTE — Progress Notes (Signed)
Patient ID: Isaiah Wu, male   DOB: 30-May-1951, 66 y.o.   MRN: [id number] PCP: Dr. Rex Wu HF Cardiology: Dr. Aundra Wu  66 yo with minimal PMH diagnosed with systolic HF in 4/09.Echo showed EF 15% with diffuse hypokinesis.  Initially seen by Dr. Wynonia Wu. He was admitted to [facility] in 4/16 for diuresis.  RHC/LHC showed elevated filling pressures and no significant coronary disease.  Underwent St Jude BiV-ICD on 07/21/15.  Repeat echo in 2/17 showed EF up to 40-45%.   Last echo in 9/17 showed EF up to 50-55%.   He is doing well generally.  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  BP readings look good.  No exertional dyspnea . He exercises daily, walks about 2.5 miles/day.  No chest pain.  No orthopnea/PND.  Weight is up but he has been on a number of trips and has eaten more than normal.     Labs 4/16: TSH normal, SPEP negative, ferritin normal, HIV negative, K 4.6, creatinine 1.22, HCT 46.9 Labs 6/16: K 3.8, Creatinine 1.08 Labs 07/18/15: K 4.1, Creatinine 0.99 Labs 9/16: digoxin 0.3, K 4.3, creatinine 1.03 Labs 11/16: K 4.3, creatinine 1.09, digoxin 0.6, BNP 23 Labs 2/17: K 4.2, creatinine 1.13, BNP 15.9, digoxin 0.4 Labs 3/17: K 4.2, creatinine 1.09, LDL 65 Labs 6/17: digoxin 0.3 Labs 7/17: K 4.3, creatinine 1.19 Labs 8/17: digoxin 0.5, K 3.9, creatinine 1.15  PMH: 1. HTN 2. GERD 3. LBBB 4. Cardiomyopathy: Nonischemic.  Echo (4/16) with EF 15%, severe diffuse hypokinesis Isaiah Wu).  RHC/LHC (4/16) with normal coronaries; mean RA 10, PA 58/35 mean 48, mean PCWP 32, no comment on CO.  HIV negative, TSH normal, ferritin normal, SPEP normal. S/p St Jude CRT-D 07/21/15.  - cMRI  04/2015 with severely dilated LV, EF 14%, septal-lateral dyssynchrony (prominent), normal RV size with mild to moderately decreased systolic function, at least moderate functional mitral regurgitation, there was basal inferoseptal subepicardial LGE (small area) and apical septal subtle mid-wall LGE (small  area).   - Echo (9/16) with EF 25-30%, mild MR => improved.  - CPX (1/17) with VE/VCO2 26.2, peak VO2 20.3, RER 1.13 => mild heart failure limitation.   - Echo (2/17) EF 40-45%.   - Echo (4/18) with EF 50-55% 5. Syncopal episode 6/16: Monitor ok. Suspect orthostasis.    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  No cardiomyopathy or sudden death.   ROS: All systems reviewed and negative except as per HPI.   Current Outpatient Prescriptions  Medication Sig Dispense Refill  . carvedilol (COREG) 6.25 MG tablet Take 1 tablet (6.25 mg) in the morning and 1 & 1/2 tablets (9.375) in the evening 225 tablet 3  . furosemide (LASIX) 20 MG tablet Take 1 tablet (20 mg total) by mouth every other day. 45 tablet 3  . sacubitril-valsartan (ENTRESTO) 49-51 MG Take 1 tablet by mouth 2 (two) times daily. 60 tablet 11  . spironolactone (ALDACTONE) 25 MG tablet Take 1 tablet (25 mg total) by mouth daily. 30 tablet 12   No current facility-administered medications for this encounter.    BP 128/84   Pulse 82   Wt 217 lb 4 oz (98.5 kg)   SpO2 99%   BMI 28.66 kg/m  General: NAD Neck: JVP 7 cm, no thyromegaly or thyroid nodule.  Lungs: CTAB CV: Nondisplaced PMI.  Heart regular  S1/S2, no S3/S4, no murmur. No peripheral edema.  No carotid bruit.  Normal pedal pulses.  Abdomen: Soft, nontender, no hepatosplenomegaly, mild distention.  Skin: Intact without lesions or rashes.  Neurologic: Alert and oriented x 3.  Psych: Normal affect. Extremities: No clubbing or cyanosis.  HEENT: Normal.   Assessment/Plan:  1. Chronic systolic CHF: Nonischemic cardiomyopathy, EF 15% initially, up to 50-55% with medical treatment and St Jude CRT-D. Etiology of cardiomyopathy is uncertain: normal coronaries, no symptoms suggestive of viral syndrome prior to admission, HIV/Ferritin/SPEP/TSH  unremarkable.  No history of familial CMP.  He has LBBB of uncertain duration.  ? LBBB cardiomyopathy.  However, there was also mid-wall LGE in the septum on cardiac MRI, suggesting possibility of myocarditis.  CPX in 1/17 suggested only a mild heart failure limitation.  NYHA class II but fatigues easily.  He looks euvolemic on exam and weight is stable. - Continue Lasix every other day. BMET today.    - Continue spironolactone 25 mg daily and current Entresto.  He will need BMET again in 3 months.  - Continue Coreg 6.25 qam/9.375 mg qpm.      - Repeat echo in 6 months at followup to make sure that EF stays up.  - With significant rise in EF, I think that he can stop digoxin.    2. LBBB: Of uncertain duration.   Followup in 6 months.    Isaiah Wu 04/14/2017

## 2017-04-27 ENCOUNTER — Encounter (HOSPITAL_COMMUNITY): Payer: Self-pay

## 2017-04-27 NOTE — Progress Notes (Signed)
[employer] faxed medical record request on behalf of patient for all records from our office 08/14/16. 22 pages total faxed to provided # [phone] Copy of request scanned into patient's electronic medical record.  [name], RN

## 2017-05-23 NOTE — Progress Notes (Signed)
Patient picked up his copy today.

## 2017-05-25 ENCOUNTER — Ambulatory Visit (INDEPENDENT_AMBULATORY_CARE_PROVIDER_SITE_OTHER): Payer: Self-pay | Admitting: *Deleted

## 2017-05-25 DIAGNOSIS — I428 Other cardiomyopathies: Secondary | ICD-10-CM

## 2017-05-25 NOTE — Progress Notes (Signed)
Remote ICD transmission.   

## 2017-05-31 ENCOUNTER — Encounter: Payer: Self-pay | Admitting: Cardiology

## 2017-05-31 LAB — CUP PACEART REMOTE DEVICE CHECK
Battery Remaining Longevity: 49 mo
Battery Remaining Percentage: 70 %
Battery Voltage: 2.95 V
Brady Statistic AP VP Percent: 42 %
Brady Statistic AP VS Percent: 1 %
Brady Statistic AS VP Percent: 58 %
Brady Statistic AS VS Percent: 1 %
Brady Statistic RA Percent Paced: 42 %
Date Time Interrogation Session: 20180606101229
HighPow Impedance: 71 Ohm
HighPow Impedance: 71 Ohm
Implantable Lead Implant Date: 20160801
Implantable Lead Implant Date: 20160801
Implantable Lead Implant Date: 20160801
Implantable Lead Location: 753858
Implantable Lead Location: 753859
Implantable Lead Location: 753860
Implantable Lead Model: 7122
Implantable Pulse Generator Implant Date: 20160801
Lead Channel Impedance Value: 1075 Ohm
Lead Channel Impedance Value: 410 Ohm
Lead Channel Impedance Value: 410 Ohm
Lead Channel Pacing Threshold Amplitude: 0.75 V
Lead Channel Pacing Threshold Amplitude: 1.25 V
Lead Channel Pacing Threshold Amplitude: 2 V
Lead Channel Pacing Threshold Pulse Width: 0.5 ms
Lead Channel Pacing Threshold Pulse Width: 0.5 ms
Lead Channel Pacing Threshold Pulse Width: 0.5 ms
Lead Channel Sensing Intrinsic Amplitude: 11.7 mV
Lead Channel Sensing Intrinsic Amplitude: 4.5 mV
Lead Channel Setting Pacing Amplitude: 1.75 V
Lead Channel Setting Pacing Amplitude: 2.5 V
Lead Channel Setting Pacing Amplitude: 2.5 V
Lead Channel Setting Pacing Pulse Width: 0.5 ms
Lead Channel Setting Pacing Pulse Width: 0.5 ms
Lead Channel Setting Sensing Sensitivity: 0.5 mV
Pulse Gen Serial Number: 7290135

## 2017-06-01 ENCOUNTER — Telehealth (HOSPITAL_COMMUNITY): Payer: Self-pay

## 2017-06-01 NOTE — Telephone Encounter (Signed)
Left VM for patient to pick up disability paper work. Copy of paperwork scanned into patient's electronic medical record.  [name], RN

## 2017-06-26 ENCOUNTER — Other Ambulatory Visit (HOSPITAL_COMMUNITY): Payer: Self-pay | Admitting: Cardiology

## 2017-07-14 ENCOUNTER — Ambulatory Visit (HOSPITAL_COMMUNITY)
Admission: RE | Admit: 2017-07-14 | Discharge: 2017-07-14 | Disposition: A | Discharge status: Home / Self Care | Payer: Self-pay | Source: Ambulatory Visit | Attending: Cardiology | Admitting: Cardiology

## 2017-07-14 DIAGNOSIS — I5022 Chronic systolic (congestive) heart failure: Secondary | ICD-10-CM | POA: Insufficient documentation

## 2017-07-14 LAB — BASIC METABOLIC PANEL
Anion gap: 9 (ref 5–15)
BUN: 15 mg/dL (ref 6–20)
CO2: 21 mmol/L — ABNORMAL LOW (ref 22–32)
Calcium: 9.5 mg/dL (ref 8.9–10.3)
Chloride: 108 mmol/L (ref 101–111)
Creatinine, Ser: 1.15 mg/dL (ref 0.61–1.24)
GFR calc Af Amer: >60 mL/min (ref >60)
GFR calc non Af Amer: >60 mL/min (ref >60)
Glucose, Bld: 114 mg/dL — ABNORMAL HIGH (ref 65–99)
Potassium: 4 mmol/L (ref 3.5–5.1)
Sodium: 138 mmol/L (ref 135–145)

## 2017-07-25 ENCOUNTER — Telehealth (HOSPITAL_COMMUNITY): Payer: Self-pay

## 2017-07-25 ENCOUNTER — Telehealth (HOSPITAL_COMMUNITY): Payer: Self-pay | Admitting: *Deleted

## 2017-07-25 NOTE — Telephone Encounter (Signed)
Attempted to return call to patient, no answer, left VM explaining we have not received any disability paperwork on his behalf and advised to have paperwork sent to fax # [phone] and to include any specific dates or requests on the paperwork to have the claim completed in a timely manner.  [name], RN

## 2017-07-25 NOTE — Telephone Encounter (Signed)
Patient called and left message on triage line stating he needed to speak to someone regarding disability.  Will forward to Vilinda Blanks, RN who handles disability for Dr. Claris Gladden patients.

## 2017-07-25 NOTE — Telephone Encounter (Signed)
Patient requesting to continue long term disability, however, [employer] notes per our last OV notes that patients EF and overall symptoms have improved over the past year and feel he can return to work at [employer] with lifting restrictions of no more than 25 lbs, rest breaks as needs, avoiding heights and extreme temperatures. Patient states he is unable to work. Will forward to Dr. Aundra Dubin to review patient's records to determine if patient is able to return to work or if patient needs to be seen first to further determine ability to return to work.  [name], RN

## 2017-07-26 ENCOUNTER — Encounter (HOSPITAL_COMMUNITY): Payer: Self-pay

## 2017-07-26 NOTE — Telephone Encounter (Addendum)
Spoke with patient to update on status of disability renewal process. Advised that per out OV ntoes and diagnostic results, patient has had a fair amount of improvement. [employer] recommends starting back work with restrictions, patient is hesitant and concerned of what type of work he will be asked to do. Letter clarifying restrictions and patient abilities as well as request for job description sent to determine more specific restrictions, hours per day, etc. Patient aware this letter was faxed and will update patient as we received updated from company. Copy of request and letter scanned into patient's electronic medical record.  [name], RN

## 2017-08-17 NOTE — Progress Notes (Signed)
Electrophysiology Office Note Date: 08/18/2017  ID:  Isaiah Wu, DOB March 14, 1951, MRN [id number]  PCP: Hulan Fess, MD Primary Cardiologist: Aundra Dubin Electrophysiologist: Caryl Comes  CC: Routine ICD follow-up  Isaiah Wu is a 66 y.o. male seen today for Dr Caryl Comes.  He presents today for routine electrophysiology followup.  Since last being seen in our clinic, the patient reports doing reasonably well.  He had initial significant improvement post CRT but feels that his energy level has decreased some recently. He denies chest pain, palpitations, dyspnea, PND, orthopnea, nausea, vomiting, dizziness, syncope, edema, weight gain, or early satiety.  He has not had ICD shocks.   Device History: STJ CRTD implanted 2016 for NICM, LBBB History of appropriate therapy: No History of AAD therapy: No   Past Medical History:  Diagnosis Date  . Cardiomyopathy ([place]) 04/07/2015  . Chronic systolic CHF (congestive heart failure) ([place]) 04/14/2015   a. s/p STJ CRTD b. EF normalized with CRT therapy  . GERD (gastroesophageal reflux disease)   . Hypertension   . LBBB (left bundle branch block)   . Syncope 05/30/2015   Past Surgical History:  Procedure Laterality Date  . CARDIAC CATHETERIZATION    . EP IMPLANTABLE DEVICE N/A 07/21/2015   STJ CRTD implanted by Dr Caryl Comes for NICM, CHF  . FRACTURE SURGERY    . LEFT AND RIGHT HEART CATHETERIZATION WITH CORONARY ANGIOGRAM N/A 04/09/2015   Procedure: LEFT AND RIGHT HEART CATHETERIZATION WITH CORONARY ANGIOGRAM;  Surgeon: Jacolyn Reedy, MD;  Location: [facility] CATH LAB;  Service: Cardiovascular;  Laterality: N/A;  . ORIF ELBOW FRACTURE Left 1981  . SHOULDER SURGERY Left 05/2008   "had to put cadavear bone in and reattach muscle to it"  . TONSILLECTOMY AND ADENOIDECTOMY  1960's    Current Outpatient Prescriptions  Medication Sig Dispense Refill  . carvedilol (COREG) 6.25 MG tablet Take 1 tablet (6.25 mg) in the morning and 1 & 1/2 tablets (9.375) in the  evening 225 tablet 3  . furosemide (LASIX) 20 MG tablet TAKE 1 TABLET (20 MG TOTAL) BY MOUTH EVERY OTHER DAY. 45 tablet 2  . sacubitril-valsartan (ENTRESTO) 49-51 MG Take 1 tablet by mouth 2 (two) times daily. 60 tablet 11  . spironolactone (ALDACTONE) 25 MG tablet Take 1 tablet (25 mg total) by mouth daily. 30 tablet 12   No current facility-administered medications for this visit.     Allergies:   Patient has no known allergies.   Social History: Social History   Social History  . Marital status: Married    Spouse name: N/A  . Number of children: N/A  . Years of education: N/A   Occupational History  . Not on file.   Social History Main Topics  . Smoking status: Former Smoker    Packs/day: 1.00    Years: 2.00    Types: Cigarettes  . Smokeless tobacco: Never Used     Comment: 'quit smoking in the 1970's"  . Alcohol use 8.4 oz/week    14 Glasses of wine per week     Comment: 07/21/2015 "1, 8oz glass of wine/day; I'm stopping now"  . Drug use: No  . Sexual activity: Yes   Other Topics Concern  . Not on file   Social History Narrative  . No narrative on file    Family History: Family History  Problem Relation Age of Onset  . Bone cancer Father   . Diabetes Father   . CVA Mother     Review of Systems: All  other systems reviewed and are otherwise negative except as noted above.   Physical Exam: VS:  BP 104/74   Pulse 72   Ht 6' (1.829 m)   Wt 217 lb (98.4 kg)   SpO2 98%   BMI 29.43 kg/m  , BMI Body mass index is 29.43 kg/m.  GEN- The patient is well appearing, alert and oriented x 3 today.   HEENT: normocephalic, atraumatic; sclera clear, conjunctiva pink; hearing intact; oropharynx clear; neck supple  Lungs- Clear to ausculation bilaterally, normal work of breathing.  No wheezes, rales, rhonchi Heart- Regular rate and rhythm (paced) GI- soft, non-tender, non-distended, bowel sounds present, no hepatosplenomegaly Extremities- no clubbing, cyanosis, or  edema  MS- no significant deformity or atrophy Skin- warm and dry, no rash or lesion; ICD pocket well healed Psych- euthymic mood, full affect Neuro- strength and sensation are intact  ICD interrogation- reviewed in detail today,  See PACEART report  EKG:  EKG is ordered today. The ekg ordered today shows sinus rhythm with CRT pacing  Recent Labs: 07/14/2017: BUN 15; Creatinine, Ser 1.15; Potassium 4.0; Sodium 138   Wt Readings from Last 3 Encounters:  08/18/17 217 lb (98.4 kg)  04/14/17 217 lb 4 oz (98.5 kg)  08/16/16 207 lb (93.9 kg)     Other studies Reviewed: Additional studies/ records that were reviewed today include: Dr Caryl Comes and Dr Claris Gladden office notes   Assessment and Plan:  1.  Chronic systolic dysfunction EF normalized with CRT  euvolemic today Stable on an appropriate medical regimen Normal ICD function Some noise on RA lead from 06/04/17 - he is unaware of EMI sources that day See Pace Art report No changes today Followed by AHF clinic - updated echo pending  2.  HTN Stable No change required today   Current medicines are reviewed at length with the patient today.   The patient does not have concerns regarding his medicines.  The following changes were made today:  none  Labs/ tests ordered today include: none Orders Placed This Encounter  Procedures  . CUP PACEART INCLINIC DEVICE CHECK     Disposition:   Follow up with Delilah Shan, Dr Caryl Comes 1 year, Dr Aundra Dubin as scheduled    Signed, Chanetta Marshall, NP 08/18/2017 8:26 AM  [address] [place] [id number] [phone] (office) [phone] (fax)

## 2017-08-18 ENCOUNTER — Ambulatory Visit (INDEPENDENT_AMBULATORY_CARE_PROVIDER_SITE_OTHER): Payer: Self-pay | Admitting: Nurse Practitioner

## 2017-08-18 ENCOUNTER — Encounter: Payer: Self-pay | Admitting: Nurse Practitioner

## 2017-08-18 VITALS — BP 104/74 | HR 72 | Ht 72.0 in | Wt 217.0 lb

## 2017-08-18 DIAGNOSIS — I10 Essential (primary) hypertension: Secondary | ICD-10-CM

## 2017-08-18 DIAGNOSIS — I5022 Chronic systolic (congestive) heart failure: Secondary | ICD-10-CM

## 2017-08-18 LAB — CUP PACEART INCLINIC DEVICE CHECK
Date Time Interrogation Session: 20180830075414
Implantable Lead Implant Date: 20160801
Implantable Lead Implant Date: 20160801
Implantable Lead Implant Date: 20160801
Implantable Lead Location: 753858
Implantable Lead Location: 753859
Implantable Lead Location: 753860
Implantable Lead Model: 7122
Implantable Pulse Generator Implant Date: 20160801
Pulse Gen Serial Number: 7290135

## 2017-08-18 NOTE — Patient Instructions (Signed)
Medication Instructions:  Your physician recommends that you continue on your current medications as directed. Please refer to the Current Medication list given to you today.   Labwork: None Ordered   Testing/Procedures: None Ordered   Follow-Up: Your physician wants you to follow-up in: 1 year with Dr. Klein. You will receive a reminder letter in the mail two months in advance. If you don't receive a letter, please call our office to schedule the follow-up appointment.   Any Other Special Instructions Will Be Listed Below (If Applicable).     If you need a refill on your cardiac medications before your next appointment, please call your pharmacy.   

## 2017-08-24 ENCOUNTER — Ambulatory Visit (INDEPENDENT_AMBULATORY_CARE_PROVIDER_SITE_OTHER): Payer: Self-pay | Admitting: *Deleted

## 2017-08-24 DIAGNOSIS — I428 Other cardiomyopathies: Secondary | ICD-10-CM

## 2017-08-25 NOTE — Progress Notes (Signed)
Remote ICD transmission.   

## 2017-08-30 ENCOUNTER — Encounter: Payer: Self-pay | Admitting: Cardiology

## 2017-09-14 LAB — CUP PACEART REMOTE DEVICE CHECK
Battery Remaining Longevity: 47 mo
Battery Remaining Percentage: 66 %
Battery Voltage: 2.95 V
Brady Statistic AP VP Percent: 14 %
Brady Statistic AP VS Percent: 0 %
Brady Statistic AS VP Percent: 86 %
Brady Statistic AS VS Percent: 1 %
Brady Statistic RA Percent Paced: 14 %
Date Time Interrogation Session: 20180905080016
HighPow Impedance: 71 Ohm
HighPow Impedance: 71 Ohm
Implantable Lead Implant Date: 20160801
Implantable Lead Implant Date: 20160801
Implantable Lead Implant Date: 20160801
Implantable Lead Location: 753858
Implantable Lead Location: 753859
Implantable Lead Location: 753860
Implantable Lead Model: 7122
Implantable Pulse Generator Implant Date: 20160801
Lead Channel Impedance Value: 1075 Ohm
Lead Channel Impedance Value: 450 Ohm
Lead Channel Impedance Value: 460 Ohm
Lead Channel Pacing Threshold Amplitude: 0.75 V
Lead Channel Pacing Threshold Amplitude: 1.5 V
Lead Channel Pacing Threshold Amplitude: 2.25 V
Lead Channel Pacing Threshold Pulse Width: 0.5 ms
Lead Channel Pacing Threshold Pulse Width: 0.5 ms
Lead Channel Pacing Threshold Pulse Width: 0.5 ms
Lead Channel Sensing Intrinsic Amplitude: 11.7 mV
Lead Channel Sensing Intrinsic Amplitude: 4.9 mV
Lead Channel Setting Pacing Amplitude: 1.75 V
Lead Channel Setting Pacing Amplitude: 2.5 V
Lead Channel Setting Pacing Amplitude: 2.5 V
Lead Channel Setting Pacing Pulse Width: 0.5 ms
Lead Channel Setting Pacing Pulse Width: 0.5 ms
Lead Channel Setting Sensing Sensitivity: 0.5 mV
Pulse Gen Serial Number: 7290135

## 2017-10-06 ENCOUNTER — Encounter (HOSPITAL_COMMUNITY): Payer: Self-pay | Admitting: Cardiology

## 2017-10-06 ENCOUNTER — Ambulatory Visit (HOSPITAL_BASED_OUTPATIENT_CLINIC_OR_DEPARTMENT_OTHER)
Admission: RE | Admit: 2017-10-06 | Discharge: 2017-10-06 | Disposition: A | Discharge status: Home / Self Care | Payer: Self-pay | Source: Ambulatory Visit | Attending: Cardiology | Admitting: Cardiology

## 2017-10-06 ENCOUNTER — Ambulatory Visit (HOSPITAL_COMMUNITY)
Admission: RE | Admit: 2017-10-06 | Discharge: 2017-10-06 | Disposition: A | Discharge status: Home / Self Care | Payer: Self-pay | Source: Ambulatory Visit | Attending: Cardiology | Admitting: Cardiology

## 2017-10-06 VITALS — BP 122/86 | HR 79 | Wt 219.1 lb

## 2017-10-06 DIAGNOSIS — Z8249 Family history of ischemic heart disease and other diseases of the circulatory system: Secondary | ICD-10-CM | POA: Insufficient documentation

## 2017-10-06 DIAGNOSIS — R42 Dizziness and giddiness: Secondary | ICD-10-CM | POA: Insufficient documentation

## 2017-10-06 DIAGNOSIS — I447 Left bundle-branch block, unspecified: Secondary | ICD-10-CM

## 2017-10-06 DIAGNOSIS — I110 Hypertensive heart disease with heart failure: Secondary | ICD-10-CM | POA: Insufficient documentation

## 2017-10-06 DIAGNOSIS — Z823 Family history of stroke: Secondary | ICD-10-CM | POA: Insufficient documentation

## 2017-10-06 DIAGNOSIS — Z79899 Other long term (current) drug therapy: Secondary | ICD-10-CM | POA: Insufficient documentation

## 2017-10-06 DIAGNOSIS — I429 Cardiomyopathy, unspecified: Secondary | ICD-10-CM | POA: Insufficient documentation

## 2017-10-06 DIAGNOSIS — I5022 Chronic systolic (congestive) heart failure: Secondary | ICD-10-CM

## 2017-10-06 DIAGNOSIS — K219 Gastro-esophageal reflux disease without esophagitis: Secondary | ICD-10-CM | POA: Insufficient documentation

## 2017-10-06 DIAGNOSIS — Z87891 Personal history of nicotine dependence: Secondary | ICD-10-CM | POA: Insufficient documentation

## 2017-10-06 LAB — CBC
HCT: 46.1 % (ref 39.0–52.0)
Hemoglobin: 15.9 g/dL (ref 13.0–17.0)
MCH: 31.1 pg (ref 26.0–34.0)
MCHC: 34.5 g/dL (ref 30.0–36.0)
MCV: 90 fL (ref 78.0–100.0)
Platelets: 271 10*3/uL (ref 150–400)
RBC: 5.12 MIL/uL (ref 4.22–5.81)
RDW: 14 % (ref 11.5–15.5)
WBC: 6.1 10*3/uL (ref 4.0–10.5)

## 2017-10-06 LAB — BASIC METABOLIC PANEL
Anion gap: 11 (ref 5–15)
BUN: 11 mg/dL (ref 6–20)
CO2: 21 mmol/L — ABNORMAL LOW (ref 22–32)
Calcium: 9.4 mg/dL (ref 8.9–10.3)
Chloride: 103 mmol/L (ref 101–111)
Creatinine, Ser: 1.03 mg/dL (ref 0.61–1.24)
GFR calc Af Amer: >60 mL/min (ref >60)
GFR calc non Af Amer: >60 mL/min (ref >60)
Glucose, Bld: 102 mg/dL — ABNORMAL HIGH (ref 65–99)
Potassium: 4 mmol/L (ref 3.5–5.1)
Sodium: 135 mmol/L (ref 135–145)

## 2017-10-06 LAB — TSH: TSH: 0.833 u[IU]/mL (ref 0.350–4.500)

## 2017-10-06 MED ORDER — FUROSEMIDE 20 MG PO TABS: 20.0000 mg | ORAL_TABLET | ORAL | 2 refills | Status: DC | PRN

## 2017-10-06 MED ORDER — CARVEDILOL 6.25 MG PO TABS: 6.2500 mg | ORAL_TABLET | Freq: Two times a day (BID) | ORAL | 3 refills | Status: DC

## 2017-10-06 NOTE — Patient Instructions (Addendum)
Labs today (will call for abnormal results, otherwise no news is good news)  Take lasix only as needed for weight gain of 3 lbs overnight or 5 lbs in 1 week.   Follow up in 6 months, please call us to schedule your follow up.

## 2017-10-06 NOTE — Progress Notes (Signed)
  Echocardiogram 2D Echocardiogram has been performed.  Isaiah Wu 10/06/2017, 10:49 AM

## 2017-10-08 NOTE — Progress Notes (Signed)
Patient ID: Isaiah Wu, male   DOB: [birth year], 66 y.o.   MRN: [id number] PCP: Dr. Rex Kras HF Cardiology: Dr. Aundra Dubin  66 y.o. with minimal PMH diagnosed with systolic HF in 8/93.Echo showed EF 15% with diffuse hypokinesis.  Initially seen by Dr. [name]. He was admitted to [facility] in 4/16 for diuresis.  RHC/LHC showed elevated filling pressures and no significant coronary disease.  Underwent St Jude BiV-ICD on 07/21/15.  Repeat echo in 2/17 showed EF up to 40-45%.   Echo done today shows EF up to 50-55%.   He returns for followup of CHF/dyspnea.  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  Occasional lightheadedness with standing but not severe.  SBP runs in the 100s-110s.  Weight stable.  No chest pain.  No orthopnea/PND. He walks 2.5 miles/day with no dyspnea.  Generally no problem walking up hills unless steep.  He still fatigues easily.  No daytime sleepiness or significant snoring.   St Jude device interrogation: >99% BiV paced, no VT, no AF, normal thoracic impedance.   Labs 4/16: TSH normal, SPEP negative, ferritin normal, HIV negative, K 4.6, creatinine 1.22, HCT 46.9 Labs 6/16: K 3.8, Creatinine 1.08 Labs 07/18/15: K 4.1, Creatinine 0.99 Labs 9/16: digoxin 0.3, K 4.3, creatinine 1.03 Labs 11/16: K 4.3, creatinine 1.09, digoxin 0.6, BNP 23 Labs 2/17: K 4.2, creatinine 1.13, BNP 15.9, digoxin 0.4 Labs 3/17: K 4.2, creatinine 1.09, LDL 65 Labs 6/17: digoxin 0.3 Labs 7/17: K 4.3, creatinine 1.19 Labs 8/17: digoxin 0.5, K 3.9, creatinine 1.15 Labs 4/18: LDL 71 Labs 7/18: K 4, creatinine 1.15  PMH: 1. HTN 2. GERD 3. LBBB 4. Cardiomyopathy: Nonischemic.  Echo (4/16) with EF 15%, severe diffuse hypokinesis [name]).  RHC/LHC (4/16) with normal coronaries; mean RA 10, PA 58/35 mean 48, mean PCWP 32, no comment on CO.  HIV negative, TSH normal, ferritin normal, SPEP normal. S/p St Jude CRT-D 07/21/15.  - cMRI  04/2015 with severely dilated LV, EF 14%, septal-lateral dyssynchrony  (prominent), normal RV size with mild to moderately decreased systolic function, at least moderate functional mitral regurgitation, there was basal inferoseptal subepicardial LGE (small area) and apical septal subtle mid-wall LGE (small area).   - Echo (9/16) with EF 25-30%, mild MR => improved.  - CPX (1/17) with VE/VCO2 26.2, peak VO2 20.3, RER 1.13 => mild heart failure limitation.   - Echo (2/17) EF 40-45%.   - Echo (4/18) with EF 50-55% - Echo (10/18): EF 50-55%, mild RV dilation with normal systolic function.  5. Syncopal episode 6/16: Monitor ok. Suspect orthostasis.    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  No cardiomyopathy or sudden death.   ROS: All systems reviewed and negative except as per HPI.   Current Outpatient Prescriptions  Medication Sig Dispense Refill  . carvedilol (COREG) 6.25 MG tablet Take 1 tablet (6.25 mg total) by mouth 2 (two) times daily with a meal. 180 tablet 3  . furosemide (LASIX) 20 MG tablet Take 1 tablet (20 mg total) by mouth as needed for fluid or edema. 30 tablet 2  . sacubitril-valsartan (ENTRESTO) 49-51 MG Take 1 tablet by mouth 2 (two) times daily. 60 tablet 11  . spironolactone (ALDACTONE) 25 MG tablet Take 1 tablet (25 mg total) by mouth daily. 30 tablet 12   No current facility-administered medications for this encounter.  BP 122/86   Pulse 79   Wt 219 lb 1.9 oz (99.4 kg)   SpO2 98%   BMI 29.72 kg/m  General: NAD Neck: No JVD, no thyromegaly or thyroid nodule.  Lungs: Clear to auscultation bilaterally with normal respiratory effort. CV: Nondisplaced PMI.  Heart regular S1/S2, no S3/S4, no murmur.  No peripheral edema.  No carotid bruit.  Normal pedal pulses.  Abdomen: Soft, nontender, no hepatosplenomegaly, no distention.  Skin: Intact without lesions or rashes.  Neurologic: Alert and oriented x 3.   Psych: Normal affect. Extremities: No clubbing or cyanosis.  HEENT: Normal.   Assessment/Plan:  1. Chronic systolic CHF: Nonischemic cardiomyopathy, EF 15% initially, up to 50-55% with medical treatment and St Jude CRT-D. Etiology of cardiomyopathy is uncertain: normal coronaries, no symptoms suggestive of viral syndrome prior to admission, HIV/Ferritin/SPEP/TSH unremarkable.  No history of familial CMP.  He has LBBB of uncertain duration.  ? LBBB cardiomyopathy.  However, there was also mid-wall LGE in the septum on cardiac MRI, suggesting possibility of myocarditis.  CPX in 1/17 suggested only a mild heart failure limitation.  NYHA class II but fatigues easily.  He is euvolemic on exam with stable weight.  - He can take Lasix prn rather than regularly.    - Continue spironolactone 25 mg daily and current Entresto.  BMET today and he will need BMET again in 3 months.  - With ongoing lightheadedness with standing (though less than in the past), will decrease Coreg to 6.25 mg bid.      2. LBBB: Of uncertain duration.   Followup in 6 months.    Chesnee Floren,MD 10/08/2017

## 2017-10-31 ENCOUNTER — Other Ambulatory Visit: Payer: Self-pay | Admitting: Cardiology

## 2017-11-03 ENCOUNTER — Other Ambulatory Visit (HOSPITAL_COMMUNITY): Payer: Self-pay | Admitting: Cardiology

## 2017-11-23 ENCOUNTER — Encounter: Payer: Self-pay | Admitting: *Deleted

## 2017-11-24 ENCOUNTER — Ambulatory Visit (INDEPENDENT_AMBULATORY_CARE_PROVIDER_SITE_OTHER): Payer: Self-pay | Admitting: *Deleted

## 2017-11-24 DIAGNOSIS — I428 Other cardiomyopathies: Secondary | ICD-10-CM

## 2017-11-25 NOTE — Progress Notes (Signed)
Remote ICD transmission.   

## 2017-11-26 ENCOUNTER — Encounter: Payer: Self-pay | Admitting: Nurse Practitioner

## 2017-12-02 ENCOUNTER — Encounter: Payer: Self-pay | Admitting: Cardiology

## 2017-12-02 LAB — CUP PACEART REMOTE DEVICE CHECK
Battery Remaining Longevity: 44 mo
Battery Remaining Percentage: 62 %
Battery Voltage: 2.95 V
Brady Statistic AP VP Percent: 16 %
Brady Statistic AP VS Percent: 1 %
Brady Statistic AS VP Percent: 84 %
Brady Statistic AS VS Percent: 1 %
Brady Statistic RA Percent Paced: 16 %
Date Time Interrogation Session: 20181207011614
HighPow Impedance: 69 Ohm
HighPow Impedance: 69 Ohm
Implantable Lead Implant Date: 20160801
Implantable Lead Implant Date: 20160801
Implantable Lead Implant Date: 20160801
Implantable Lead Location: 753858
Implantable Lead Location: 753859
Implantable Lead Location: 753860
Implantable Lead Model: 7122
Implantable Pulse Generator Implant Date: 20160801
Lead Channel Impedance Value: 1125 Ohm
Lead Channel Impedance Value: 410 Ohm
Lead Channel Impedance Value: 450 Ohm
Lead Channel Pacing Threshold Amplitude: 0.75 V
Lead Channel Pacing Threshold Amplitude: 1.5 V
Lead Channel Pacing Threshold Amplitude: 2.25 V
Lead Channel Pacing Threshold Pulse Width: 0.5 ms
Lead Channel Pacing Threshold Pulse Width: 0.5 ms
Lead Channel Pacing Threshold Pulse Width: 0.5 ms
Lead Channel Sensing Intrinsic Amplitude: 11.7 mV
Lead Channel Sensing Intrinsic Amplitude: 4.9 mV
Lead Channel Setting Pacing Amplitude: 1.75 V
Lead Channel Setting Pacing Amplitude: 2.5 V
Lead Channel Setting Pacing Amplitude: 2.5 V
Lead Channel Setting Pacing Pulse Width: 0.5 ms
Lead Channel Setting Pacing Pulse Width: 0.5 ms
Lead Channel Setting Sensing Sensitivity: 0.5 mV
Pulse Gen Serial Number: 7290135

## 2018-02-07 ENCOUNTER — Other Ambulatory Visit (HOSPITAL_COMMUNITY): Payer: Self-pay | Admitting: Pharmacist

## 2018-02-07 MED ORDER — SACUBITRIL-VALSARTAN 49-51 MG PO TABS: 1.0000 | ORAL_TABLET | Freq: Two times a day (BID) | ORAL | 11 refills | Status: DC

## 2018-03-01 ENCOUNTER — Other Ambulatory Visit (HOSPITAL_COMMUNITY): Payer: Self-pay | Admitting: Internal Medicine

## 2018-03-06 ENCOUNTER — Ambulatory Visit (INDEPENDENT_AMBULATORY_CARE_PROVIDER_SITE_OTHER): Payer: Self-pay | Admitting: *Deleted

## 2018-03-06 DIAGNOSIS — I428 Other cardiomyopathies: Secondary | ICD-10-CM

## 2018-03-06 NOTE — Progress Notes (Signed)
Remote ICD transmission.   

## 2018-03-07 LAB — CUP PACEART REMOTE DEVICE CHECK
Battery Remaining Longevity: 42 mo
Battery Remaining Percentage: 59 %
Battery Voltage: 2.93 V
Brady Statistic AP VP Percent: 13 %
Brady Statistic AP VS Percent: 1 %
Brady Statistic AS VP Percent: 86 %
Brady Statistic AS VS Percent: 1 %
Brady Statistic RA Percent Paced: 13 %
Date Time Interrogation Session: 20190318060016
HighPow Impedance: 77 Ohm
HighPow Impedance: 77 Ohm
Implantable Lead Implant Date: 20160801
Implantable Lead Implant Date: 20160801
Implantable Lead Implant Date: 20160801
Implantable Lead Location: 753858
Implantable Lead Location: 753859
Implantable Lead Location: 753860
Implantable Lead Model: 7122
Implantable Pulse Generator Implant Date: 20160801
Lead Channel Impedance Value: 1050 Ohm
Lead Channel Impedance Value: 430 Ohm
Lead Channel Impedance Value: 460 Ohm
Lead Channel Pacing Threshold Amplitude: 0.875 V
Lead Channel Pacing Threshold Amplitude: 1.5 V
Lead Channel Pacing Threshold Amplitude: 2.25 V
Lead Channel Pacing Threshold Pulse Width: 0.5 ms
Lead Channel Pacing Threshold Pulse Width: 0.5 ms
Lead Channel Pacing Threshold Pulse Width: 0.5 ms
Lead Channel Sensing Intrinsic Amplitude: 11.7 mV
Lead Channel Sensing Intrinsic Amplitude: 5 mV
Lead Channel Setting Pacing Amplitude: 1.875
Lead Channel Setting Pacing Amplitude: 2.5 V
Lead Channel Setting Pacing Amplitude: 2.5 V
Lead Channel Setting Pacing Pulse Width: 0.5 ms
Lead Channel Setting Pacing Pulse Width: 0.5 ms
Lead Channel Setting Sensing Sensitivity: 0.5 mV
Pulse Gen Serial Number: 7290135

## 2018-03-08 ENCOUNTER — Encounter: Payer: Self-pay | Admitting: Cardiology

## 2018-03-13 ENCOUNTER — Other Ambulatory Visit (HOSPITAL_COMMUNITY): Payer: Self-pay | Admitting: *Deleted

## 2018-03-22 ENCOUNTER — Other Ambulatory Visit (HOSPITAL_COMMUNITY): Payer: Self-pay | Admitting: Cardiology

## 2018-04-20 ENCOUNTER — Telehealth (HOSPITAL_COMMUNITY): Payer: Self-pay | Admitting: Pharmacist

## 2018-04-20 NOTE — Telephone Encounter (Signed)
[employer] patient assistance approved for Entresto 49-51 mg BID through 02/16/19.   Ruta Hinds. Velva Harman, PharmD, BCPS, CPP Clinical Pharmacist Phone: [phone] 04/20/2018 2:34 PM

## 2018-05-04 ENCOUNTER — Encounter (HOSPITAL_COMMUNITY): Payer: Self-pay

## 2018-05-04 NOTE — Telephone Encounter (Signed)
Left detailed message. Patient needs to come in and sign a medical records release form and and give [place] a fax or mailing address of where to send those records to. We are only able to provide records that [place] signed. He would need to contact Dr.Kleins office for any records from him.

## 2018-05-05 ENCOUNTER — Telehealth (HOSPITAL_COMMUNITY): Payer: Self-pay | Admitting: *Deleted

## 2018-05-05 NOTE — Telephone Encounter (Signed)
Pt came by to sign medical release to get his records from 2016 through present for his disability hearing.  Form signed.  Records emialed to pt at [email or web address]

## 2018-05-12 ENCOUNTER — Telehealth (HOSPITAL_COMMUNITY): Payer: Self-pay | Admitting: *Deleted

## 2018-05-12 NOTE — Telephone Encounter (Signed)
Office records faxed from  03/02/16-present as requested by disability.

## 2018-05-17 ENCOUNTER — Encounter (HOSPITAL_COMMUNITY): Payer: Self-pay

## 2018-05-17 NOTE — Progress Notes (Signed)
Medical record request for records 02/2018-present and Dr. Aundra Dubin signed off on disability MD assessment faxed to [place]. 30 pages of these records faxed back to [facility] at provided # [phone]. Copy of disability assessment scanned into patient's electronic medical record.  [name], RN

## 2018-05-18 ENCOUNTER — Encounter (HOSPITAL_COMMUNITY): Payer: Self-pay | Admitting: Cardiology

## 2018-05-22 NOTE — Telephone Encounter (Signed)
Message routed to [facility].

## 2018-06-05 ENCOUNTER — Ambulatory Visit (INDEPENDENT_AMBULATORY_CARE_PROVIDER_SITE_OTHER): Payer: Medicare Other | Admitting: *Deleted

## 2018-06-05 DIAGNOSIS — I428 Other cardiomyopathies: Secondary | ICD-10-CM

## 2018-06-05 DIAGNOSIS — I5022 Chronic systolic (congestive) heart failure: Secondary | ICD-10-CM

## 2018-06-05 NOTE — Progress Notes (Signed)
Remote ICD transmission.   

## 2018-06-16 LAB — CUP PACEART REMOTE DEVICE CHECK
Battery Remaining Longevity: 40 mo
Battery Remaining Percentage: 55 %
Battery Voltage: 2.93 V
Brady Statistic AP VP Percent: 14 %
Brady Statistic AP VS Percent: 1 %
Brady Statistic AS VP Percent: 85 %
Brady Statistic AS VS Percent: 1 %
Brady Statistic RA Percent Paced: 14 %
Date Time Interrogation Session: 20190617062643
HighPow Impedance: 73 Ohm
HighPow Impedance: 73 Ohm
Implantable Lead Implant Date: 20160801
Implantable Lead Implant Date: 20160801
Implantable Lead Implant Date: 20160801
Implantable Lead Location: 753858
Implantable Lead Location: 753859
Implantable Lead Location: 753860
Implantable Lead Model: 7122
Implantable Pulse Generator Implant Date: 20160801
Lead Channel Impedance Value: 1100 Ohm
Lead Channel Impedance Value: 410 Ohm
Lead Channel Impedance Value: 450 Ohm
Lead Channel Pacing Threshold Amplitude: 0.875 V
Lead Channel Pacing Threshold Amplitude: 1.5 V
Lead Channel Pacing Threshold Amplitude: 2.25 V
Lead Channel Pacing Threshold Pulse Width: 0.5 ms
Lead Channel Pacing Threshold Pulse Width: 0.5 ms
Lead Channel Pacing Threshold Pulse Width: 0.5 ms
Lead Channel Sensing Intrinsic Amplitude: 11.3 mV
Lead Channel Sensing Intrinsic Amplitude: 4.7 mV
Lead Channel Setting Pacing Amplitude: 1.875
Lead Channel Setting Pacing Amplitude: 2.5 V
Lead Channel Setting Pacing Amplitude: 2.5 V
Lead Channel Setting Pacing Pulse Width: 0.5 ms
Lead Channel Setting Pacing Pulse Width: 0.5 ms
Lead Channel Setting Sensing Sensitivity: 0.5 mV
Pulse Gen Serial Number: 7290135

## 2018-07-05 ENCOUNTER — Other Ambulatory Visit (HOSPITAL_COMMUNITY): Payer: Self-pay | Admitting: *Deleted

## 2018-07-05 ENCOUNTER — Encounter (HOSPITAL_COMMUNITY): Payer: Self-pay | Admitting: Cardiology

## 2018-07-05 ENCOUNTER — Ambulatory Visit (HOSPITAL_COMMUNITY)
Admission: RE | Admit: 2018-07-05 | Discharge: 2018-07-05 | Disposition: A | Discharge status: Home / Self Care | Payer: Medicare Other | Source: Ambulatory Visit | Attending: Cardiology | Admitting: Cardiology

## 2018-07-05 VITALS — BP 118/78 | HR 81 | Wt 221.1 lb

## 2018-07-05 DIAGNOSIS — Z4502 Encounter for adjustment and management of automatic implantable cardiac defibrillator: Secondary | ICD-10-CM | POA: Diagnosis not present

## 2018-07-05 DIAGNOSIS — I5022 Chronic systolic (congestive) heart failure: Secondary | ICD-10-CM | POA: Diagnosis not present

## 2018-07-05 DIAGNOSIS — I110 Hypertensive heart disease with heart failure: Secondary | ICD-10-CM | POA: Insufficient documentation

## 2018-07-05 DIAGNOSIS — Z8249 Family history of ischemic heart disease and other diseases of the circulatory system: Secondary | ICD-10-CM | POA: Diagnosis not present

## 2018-07-05 DIAGNOSIS — Z823 Family history of stroke: Secondary | ICD-10-CM | POA: Insufficient documentation

## 2018-07-05 DIAGNOSIS — Z87891 Personal history of nicotine dependence: Secondary | ICD-10-CM | POA: Diagnosis not present

## 2018-07-05 DIAGNOSIS — K219 Gastro-esophageal reflux disease without esophagitis: Secondary | ICD-10-CM | POA: Insufficient documentation

## 2018-07-05 DIAGNOSIS — Z79899 Other long term (current) drug therapy: Secondary | ICD-10-CM | POA: Insufficient documentation

## 2018-07-05 DIAGNOSIS — I447 Left bundle-branch block, unspecified: Secondary | ICD-10-CM | POA: Diagnosis not present

## 2018-07-05 DIAGNOSIS — I428 Other cardiomyopathies: Secondary | ICD-10-CM | POA: Insufficient documentation

## 2018-07-05 LAB — CBC
HCT: 47.9 % (ref 39.0–52.0)
Hemoglobin: 15.8 g/dL (ref 13.0–17.0)
MCH: 29.9 pg (ref 26.0–34.0)
MCHC: 33 g/dL (ref 30.0–36.0)
MCV: 90.5 fL (ref 78.0–100.0)
Platelets: 308 10*3/uL (ref 150–400)
RBC: 5.29 MIL/uL (ref 4.22–5.81)
RDW: 13.4 % (ref 11.5–15.5)
WBC: 7.3 10*3/uL (ref 4.0–10.5)

## 2018-07-05 LAB — BASIC METABOLIC PANEL
Anion gap: 11 (ref 5–15)
BUN: 17 mg/dL (ref 8–23)
CO2: 23 mmol/L (ref 22–32)
Calcium: 9.7 mg/dL (ref 8.9–10.3)
Chloride: 106 mmol/L (ref 98–111)
Creatinine, Ser: 1.15 mg/dL (ref 0.61–1.24)
GFR calc Af Amer: >60 mL/min (ref >60)
GFR calc non Af Amer: >60 mL/min (ref >60)
Glucose, Bld: 105 mg/dL — ABNORMAL HIGH (ref 70–99)
Potassium: 4.2 mmol/L (ref 3.5–5.1)
Sodium: 140 mmol/L (ref 135–145)

## 2018-07-05 LAB — LIPID PANEL
Cholesterol: 164 mg/dL (ref 0–200)
HDL: 48 mg/dL (ref >40)
LDL Cholesterol: 98 mg/dL (ref 0–99)
Total CHOL/HDL Ratio: 3.4 RATIO
Triglycerides: 88 mg/dL (ref <150)
VLDL: 18 mg/dL (ref 0–40)

## 2018-07-05 MED ORDER — CARVEDILOL 6.25 MG PO TABS: 6.2500 mg | ORAL_TABLET | Freq: Two times a day (BID) | ORAL | 3 refills | Status: DC

## 2018-07-05 MED ORDER — FUROSEMIDE 20 MG PO TABS: 20.0000 mg | ORAL_TABLET | Freq: Every day | ORAL | 2 refills | Status: DC | PRN

## 2018-07-05 MED ORDER — SPIRONOLACTONE 25 MG PO TABS: 25.0000 mg | ORAL_TABLET | Freq: Every day | ORAL | 3 refills | Status: DC

## 2018-07-05 NOTE — Progress Notes (Signed)
Patient ID: Isaiah Wu, male   DOB: 08-Aug-1951, 67 y.o.   MRN: [id number] PCP: Dr. Rex Kras HF Cardiology: Dr. Aundra Dubin  67 y.o. with minimal PMH diagnosed with systolic HF in 9/98.Echo showed EF 15% with diffuse hypokinesis.  Initially seen by Dr. [name]. He was admitted to [facility] in 4/16 for diuresis.  RHC/LHC showed elevated filling pressures and no significant coronary disease.  Underwent St Jude BiV-ICD on 07/21/15.  Repeat echo in 2/17 showed EF up to 40-45%.  Echo in 10/18 showed EF up to 50-55%.   He returns for followup of CHF/dyspnea.  He is doing well symptomatically.  Rare lightheadedness with standing.  Short of breath climbing 2 flights stairs.  No dyspnea walking on flat ground.  Walks for exercise.  Fatigues more easily than in the past.  No chest pain.  No orthopnea/PND.    St Jude device interrogation: >99% BiV paced, no VT, no AF, normal thoracic impedance.    Labs 4/16: TSH normal, SPEP negative, ferritin normal, HIV negative, K 4.6, creatinine 1.22, HCT 46.9 Labs 6/16: K 3.8, Creatinine 1.08 Labs 07/18/15: K 4.1, Creatinine 0.99 Labs 9/16: digoxin 0.3, K 4.3, creatinine 1.03 Labs 11/16: K 4.3, creatinine 1.09, digoxin 0.6, BNP 23 Labs 2/17: K 4.2, creatinine 1.13, BNP 15.9, digoxin 0.4 Labs 3/17: K 4.2, creatinine 1.09, LDL 65 Labs 6/17: digoxin 0.3 Labs 7/17: K 4.3, creatinine 1.19 Labs 8/17: digoxin 0.5, K 3.9, creatinine 1.15 Labs 4/18: LDL 71 Labs 7/18: K 4, creatinine 1.15 Labs 10/18: K 4, creatinine 1.03, TSH normal, hgb 15.9  PMH: 1. HTN 2. GERD 3. LBBB 4. Cardiomyopathy: Nonischemic.  Echo (4/16) with EF 15%, severe diffuse hypokinesis [name]).  RHC/LHC (4/16) with normal coronaries; mean RA 10, PA 58/35 mean 48, mean PCWP 32, no comment on CO.  HIV negative, TSH normal, ferritin normal, SPEP normal. S/p St Jude CRT-D 07/21/15.  - cMRI  04/2015 with severely dilated LV, EF 14%, septal-lateral dyssynchrony (prominent), normal RV size with mild to moderately decreased  systolic function, at least moderate functional mitral regurgitation, there was basal inferoseptal subepicardial LGE (small area) and apical septal subtle mid-wall LGE (small area).   - Echo (9/16) with EF 25-30%, mild MR => improved.  - CPX (1/17) with VE/VCO2 26.2, peak VO2 20.3, RER 1.13 => mild heart failure limitation.   - Echo (2/17) EF 40-45%.   - Echo (4/18) with EF 50-55% - Echo (10/18): EF 50-55%, mild RV dilation with normal systolic function.  5. Syncopal episode 6/16: Monitor ok. Suspect orthostasis.    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  No cardiomyopathy or sudden death.   ROS: All systems reviewed and negative except as per HPI.   Current Outpatient Medications  Medication Sig Dispense Refill  . sacubitril-valsartan (ENTRESTO) 49-51 MG Take 1 tablet by mouth 2 (two) times daily. 60 tablet 11  . carvedilol (COREG) 6.25 MG tablet Take 1 tablet (6.25 mg total) by mouth 2 (two) times daily with a meal. 180 tablet 3  . furosemide (LASIX) 20 MG tablet Take 1 tablet (20 mg total) by mouth daily as needed. 90 tablet 2  . spironolactone (ALDACTONE) 25 MG tablet Take 1 tablet (25 mg total) by mouth daily. 90 tablet 3   No current facility-administered medications for this encounter.    BP 118/78   Pulse 81   Wt  221 lb 1.9 oz (100.3 kg)   SpO2 98%   BMI 29.99 kg/m  General: NAD Neck: No JVD, no thyromegaly or thyroid nodule.  Lungs: Clear to auscultation bilaterally with normal respiratory effort. CV: Nondisplaced PMI.  Heart regular S1/S2, no S3/S4, no murmur.  No peripheral edema.  No carotid bruit.  Normal pedal pulses.  Abdomen: Soft, nontender, no hepatosplenomegaly, no distention.  Skin: Intact without lesions or rashes.  Neurologic: Alert and oriented x 3.  Psych: Normal affect. Extremities: No clubbing or cyanosis.  HEENT: Normal.    Assessment/Plan:  1. Chronic systolic CHF: Nonischemic cardiomyopathy, EF 15% initially, up to 50-55% with medical treatment and St Jude CRT-D. Etiology of cardiomyopathy is uncertain: normal coronaries, no symptoms suggestive of viral syndrome prior to admission, HIV/Ferritin/SPEP/TSH unremarkable.  No history of familial CMP.  He has LBBB of uncertain duration.  ? LBBB cardiomyopathy.  However, there was also mid-wall LGE in the septum on cardiac MRI, suggesting possibility of myocarditis.  CPX in 1/17 suggested only a mild heart failure limitation.  NYHA class II but fatigues easily.  He is euvolemic on exam with stable weight.  - Continue spironolactone 25 mg daily, Coreg 6.25 mg bid, and current Entresto.  BMET today and he will need BMET again in 3 months.  - Repeat echo in 10/18.       2. LBBB: Of uncertain duration.   Followup in 6 months.    [name],MD 07/05/2018

## 2018-07-05 NOTE — Patient Instructions (Addendum)
Labs today (will call for abnormal results, otherwise no news is good news)  Echocardiogram in October  Labs in 3 months (bmet)  Follow up in 6 months, please call our office at [phone], Option 3 in November to schedule your follow up with Dr. Aundra Dubin.

## 2018-07-06 ENCOUNTER — Other Ambulatory Visit (HOSPITAL_COMMUNITY): Payer: Self-pay | Admitting: Cardiology

## 2018-07-06 NOTE — Telephone Encounter (Signed)
Opened in error

## 2018-08-16 ENCOUNTER — Encounter (HOSPITAL_COMMUNITY): Payer: Self-pay

## 2018-08-16 NOTE — Progress Notes (Signed)
Physician statement form for [employer] completed by Dr. Aundra Dubin on behalf of patient. LVM for patient to make aware that forms were completed in sealed envelope at front desk for pick up.  [name], RN

## 2018-08-18 ENCOUNTER — Ambulatory Visit: Payer: Medicare Other | Admitting: Internal Medicine

## 2018-08-18 VITALS — BP 134/80 | HR 70 | Ht 72.0 in | Wt 222.4 lb

## 2018-08-18 DIAGNOSIS — I428 Other cardiomyopathies: Secondary | ICD-10-CM

## 2018-08-18 DIAGNOSIS — I5022 Chronic systolic (congestive) heart failure: Secondary | ICD-10-CM

## 2018-08-18 NOTE — Patient Instructions (Addendum)
Medication Instructions:  Your physician recommends that you continue on your current medications as directed. Please refer to the Current Medication list given to you today.  *If you need a refill on your cardiac medications before your next appointment, please call your pharmacy*  Labwork: None ordered  Testing/Procedures: None ordered  Follow-Up: Remote monitoring is used to monitor your Pacemaker or ICD from home. This monitoring reduces the number of office visits required to check your device to one time per year. It allows [place] to keep an eye on the functioning of your device to ensure it is working properly. You are scheduled for a device check from home on 09/04/2018. You may send your transmission at any time that day. If you have a wireless device, the transmission will be sent automatically. After your physician reviews your transmission, you will receive a postcard with your next transmission date.  Your physician wants you to follow-up in: 1 year with Chanetta Marshall, NP.  You will receive a reminder letter in the mail two months in advance. If you don't receive a letter, please call our office to schedule the follow-up appointment.  Thank you for choosing [facility]!!

## 2018-08-18 NOTE — Progress Notes (Signed)
ode     Patient Care Team: Hulan Fess, MD as PCP - General (Family Medicine) Jacolyn Reedy, MD as Consulting Physician (Cardiology)   HPI  Isaiah Wu is a 67 y.o. male Seen in followup for CRT- ICD implanted for NICM LBBB and hx of syncope  There has been interval resolution of his LVEF  No syncope.  No chest pain.  No edema.  Some mild dyspnea and fatigue with heavy work.  Tolerating medications without difficulty.     DATE TEST EF   4/16 Echo  15%   4/16 LHC  CAs normal  2/17 Echo  45%   10/18 Echo  55%      Device History: STJ CRTD implanted 2016 for NICM, CHF, LBBB History of appropriate therapy: No History of AAD therapy: No     Past Medical History:  Diagnosis Date  . Cardiomyopathy ([place]) 04/07/2015  . Chronic systolic CHF (congestive heart failure) ([place]) 04/14/2015   a. s/p STJ CRTD b. EF normalized with CRT therapy  . GERD (gastroesophageal reflux disease)   . Hypertension   . LBBB (left bundle branch block)   . Syncope 05/30/2015    Past Surgical History:  Procedure Laterality Date  . CARDIAC CATHETERIZATION    . EP IMPLANTABLE DEVICE N/A 07/21/2015   STJ CRTD implanted by Dr Caryl Comes for NICM, CHF  . FRACTURE SURGERY    . LEFT AND RIGHT HEART CATHETERIZATION WITH CORONARY ANGIOGRAM N/A 04/09/2015   Procedure: LEFT AND RIGHT HEART CATHETERIZATION WITH CORONARY ANGIOGRAM;  Surgeon: Jacolyn Reedy, MD;  Location: [facility] CATH LAB;  Service: Cardiovascular;  Laterality: N/A;  . ORIF ELBOW FRACTURE Left 1981  . SHOULDER SURGERY Left 05/2008   "had to put cadavear bone in and reattach muscle to it"  . TONSILLECTOMY AND ADENOIDECTOMY  1960's    Current Outpatient Medications  Medication Sig Dispense Refill  . carvedilol (COREG) 6.25 MG tablet Take 1 tablet (6.25 mg total) by mouth 2 (two) times daily with a meal. 180 tablet 3  . furosemide (LASIX) 20 MG tablet Take 1 tablet (20 mg total) by mouth daily as needed. 90 tablet 2  . sacubitril-valsartan  (ENTRESTO) 49-51 MG Take 1 tablet by mouth 2 (two) times daily. 60 tablet 11  . spironolactone (ALDACTONE) 25 MG tablet Take 1 tablet (25 mg total) by mouth daily. 90 tablet 3   No current facility-administered medications for this visit.     No Known Allergies    Review of Systems negative except from HPI and PMH  Physical Exam BP 134/80 (BP Location: Left Arm, Patient Position: Sitting, Cuff Size: Normal)   Pulse 70   Ht 6' (1.829 m)   Wt 222 lb 6.4 oz (100.9 kg)   BMI 30.16 kg/m  Well developed and nourished in no acute distress HENT normal Neck supple with JVP-flat Clear Regular rate and rhythm, no murmurs or gallops Abd-soft with active BS No Clubbing cyanosis edema Skin-warm and dry A & Oriented  Grossly normal sensory and motor function  ECG   p synchronous with upright QRS V1 and neg QRS lead 1  Assessment and  Plan  Nonischemic cardiomyopathy  Congestive heart failure class   CRT-D The patient's device was interrogated.  The information was reviewed. No changes were made in the programming.     Euvolemic continue current meds  Will see in 1 yrs

## 2018-08-22 ENCOUNTER — Encounter: Payer: Self-pay | Admitting: Internal Medicine

## 2018-08-22 LAB — CUP PACEART INCLINIC DEVICE CHECK
Battery Remaining Longevity: 37 mo
Brady Statistic RA Percent Paced: 14 %
Brady Statistic RV Percent Paced: 99.24 %
Date Time Interrogation Session: 20190830150947
HighPow Impedance: 75.375
Implantable Lead Implant Date: 20160801
Implantable Lead Implant Date: 20160801
Implantable Lead Implant Date: 20160801
Implantable Lead Location: 753858
Implantable Lead Location: 753859
Implantable Lead Location: 753860
Implantable Lead Model: 7122
Implantable Pulse Generator Implant Date: 20160801
Lead Channel Impedance Value: 1062.5 Ohm
Lead Channel Impedance Value: 412.5 Ohm
Lead Channel Impedance Value: 475 Ohm
Lead Channel Pacing Threshold Amplitude: 0.75 V
Lead Channel Pacing Threshold Amplitude: 0.75 V
Lead Channel Pacing Threshold Amplitude: 1 V
Lead Channel Pacing Threshold Amplitude: 1 V
Lead Channel Pacing Threshold Amplitude: 2 V
Lead Channel Pacing Threshold Amplitude: 2 V
Lead Channel Pacing Threshold Pulse Width: 0.5 ms
Lead Channel Pacing Threshold Pulse Width: 0.5 ms
Lead Channel Pacing Threshold Pulse Width: 0.5 ms
Lead Channel Pacing Threshold Pulse Width: 0.5 ms
Lead Channel Pacing Threshold Pulse Width: 0.5 ms
Lead Channel Pacing Threshold Pulse Width: 0.5 ms
Lead Channel Sensing Intrinsic Amplitude: 11.7 mV
Lead Channel Sensing Intrinsic Amplitude: 4.5 mV
Lead Channel Setting Pacing Amplitude: 1.875
Lead Channel Setting Pacing Amplitude: 2.5 V
Lead Channel Setting Pacing Amplitude: 2.5 V
Lead Channel Setting Pacing Pulse Width: 0.5 ms
Lead Channel Setting Pacing Pulse Width: 0.5 ms
Lead Channel Setting Sensing Sensitivity: 0.5 mV
Pulse Gen Serial Number: 7290135

## 2018-09-04 ENCOUNTER — Ambulatory Visit (INDEPENDENT_AMBULATORY_CARE_PROVIDER_SITE_OTHER): Payer: Medicare Other | Admitting: *Deleted

## 2018-09-04 DIAGNOSIS — I5022 Chronic systolic (congestive) heart failure: Secondary | ICD-10-CM

## 2018-09-04 DIAGNOSIS — I428 Other cardiomyopathies: Secondary | ICD-10-CM

## 2018-09-04 NOTE — Progress Notes (Signed)
Remote ICD transmission.   

## 2018-09-28 ENCOUNTER — Ambulatory Visit (HOSPITAL_COMMUNITY)
Admission: RE | Admit: 2018-09-28 | Discharge: 2018-09-28 | Disposition: A | Discharge status: Home / Self Care | Payer: Medicare Other | Source: Ambulatory Visit | Attending: Internal Medicine | Admitting: Internal Medicine

## 2018-09-28 ENCOUNTER — Ambulatory Visit (HOSPITAL_BASED_OUTPATIENT_CLINIC_OR_DEPARTMENT_OTHER)
Admission: RE | Admit: 2018-09-28 | Discharge: 2018-09-28 | Disposition: A | Discharge status: Home / Self Care | Payer: Medicare Other | Source: Ambulatory Visit

## 2018-09-28 DIAGNOSIS — I428 Other cardiomyopathies: Secondary | ICD-10-CM | POA: Insufficient documentation

## 2018-09-28 DIAGNOSIS — I110 Hypertensive heart disease with heart failure: Secondary | ICD-10-CM | POA: Insufficient documentation

## 2018-09-28 DIAGNOSIS — I5022 Chronic systolic (congestive) heart failure: Secondary | ICD-10-CM | POA: Diagnosis not present

## 2018-09-28 DIAGNOSIS — I447 Left bundle-branch block, unspecified: Secondary | ICD-10-CM | POA: Insufficient documentation

## 2018-09-28 LAB — BASIC METABOLIC PANEL
Anion gap: 8 (ref 5–15)
BUN: 13 mg/dL (ref 8–23)
CO2: 26 mmol/L (ref 22–32)
Calcium: 9.7 mg/dL (ref 8.9–10.3)
Chloride: 106 mmol/L (ref 98–111)
Creatinine, Ser: 1.13 mg/dL (ref 0.61–1.24)
GFR calc Af Amer: >60 mL/min (ref >60)
GFR calc non Af Amer: >60 mL/min (ref >60)
Glucose, Bld: 99 mg/dL (ref 70–99)
Potassium: 4.1 mmol/L (ref 3.5–5.1)
Sodium: 140 mmol/L (ref 135–145)

## 2018-09-28 NOTE — Progress Notes (Signed)
  Echocardiogram 2D Echocardiogram has been performed.  Strain does not appear to be accurate.   Isaiah Wu L Androw 09/28/2018, 10:34 AM

## 2018-09-29 ENCOUNTER — Telehealth (HOSPITAL_COMMUNITY): Payer: Self-pay

## 2018-09-29 NOTE — Telephone Encounter (Signed)
Pt called with results Slightly decreased LV systolic function, 10-62%.  Will review at followup, agrees woth plan to talk to [place] at next appointment

## 2018-10-03 LAB — CUP PACEART REMOTE DEVICE CHECK
Battery Remaining Longevity: 37 mo
Battery Remaining Percentage: 51 %
Battery Voltage: 2.93 V
Brady Statistic AP VP Percent: 15 %
Brady Statistic AP VS Percent: 1 %
Brady Statistic AS VP Percent: 84 %
Brady Statistic AS VS Percent: 1 %
Brady Statistic RA Percent Paced: 15 %
Date Time Interrogation Session: 20190916060015
HighPow Impedance: 80 Ohm
HighPow Impedance: 80 Ohm
Implantable Lead Implant Date: 20160801
Implantable Lead Implant Date: 20160801
Implantable Lead Implant Date: 20160801
Implantable Lead Location: 753858
Implantable Lead Location: 753859
Implantable Lead Location: 753860
Implantable Lead Model: 7122
Implantable Pulse Generator Implant Date: 20160801
Lead Channel Impedance Value: 1075 Ohm
Lead Channel Impedance Value: 410 Ohm
Lead Channel Impedance Value: 460 Ohm
Lead Channel Pacing Threshold Amplitude: 0.75 V
Lead Channel Pacing Threshold Amplitude: 1 V
Lead Channel Pacing Threshold Amplitude: 2 V
Lead Channel Pacing Threshold Pulse Width: 0.5 ms
Lead Channel Pacing Threshold Pulse Width: 0.5 ms
Lead Channel Pacing Threshold Pulse Width: 0.5 ms
Lead Channel Sensing Intrinsic Amplitude: 11.7 mV
Lead Channel Sensing Intrinsic Amplitude: 5 mV
Lead Channel Setting Pacing Amplitude: 1.75 V
Lead Channel Setting Pacing Amplitude: 2.5 V
Lead Channel Setting Pacing Amplitude: 2.5 V
Lead Channel Setting Pacing Pulse Width: 0.5 ms
Lead Channel Setting Pacing Pulse Width: 0.5 ms
Lead Channel Setting Sensing Sensitivity: 0.5 mV
Pulse Gen Serial Number: 7290135

## 2018-12-04 ENCOUNTER — Ambulatory Visit (INDEPENDENT_AMBULATORY_CARE_PROVIDER_SITE_OTHER): Payer: Medicare Other

## 2018-12-04 DIAGNOSIS — I428 Other cardiomyopathies: Secondary | ICD-10-CM

## 2018-12-04 NOTE — Progress Notes (Signed)
Remote ICD transmission.   

## 2019-01-03 ENCOUNTER — Telehealth (HOSPITAL_COMMUNITY): Payer: Self-pay | Admitting: Licensed Clinical Social Worker

## 2019-01-03 NOTE — Telephone Encounter (Signed)
CSW received notification from [employer] that pt assistance due to expire next month.  CSW called pt to inform and emailed pt another application to complete- pt to bring in to clinic once done.  CSW will continue to follow in clinic and assist as needed  Jorge Ny, [place] Worker [facility] [phone]

## 2019-01-09 ENCOUNTER — Other Ambulatory Visit (HOSPITAL_COMMUNITY): Payer: Self-pay

## 2019-01-09 ENCOUNTER — Telehealth (HOSPITAL_COMMUNITY): Payer: Self-pay | Admitting: Licensed Clinical Social Worker

## 2019-01-09 MED ORDER — SACUBITRIL-VALSARTAN 49-51 MG PO TABS: 1.0000 | ORAL_TABLET | Freq: Two times a day (BID) | ORAL | 11 refills | Status: DC

## 2019-01-09 NOTE — Telephone Encounter (Signed)
CSW submitted completed [employer] application for [employer] assistance-  Fax confirmation received  CSW will continue to follow and assist as needed  Jorge Ny, [place] Worker [facility] [phone]

## 2019-01-14 LAB — CUP PACEART REMOTE DEVICE CHECK
Battery Remaining Longevity: 34 mo
Battery Remaining Percentage: 47 %
Battery Voltage: 2.92 V
Brady Statistic AP VP Percent: 12 %
Brady Statistic AP VS Percent: 1 %
Brady Statistic AS VP Percent: 87 %
Brady Statistic AS VS Percent: 1 %
Brady Statistic RA Percent Paced: 12 %
Date Time Interrogation Session: 20191216093620
HighPow Impedance: 77 Ohm
HighPow Impedance: 77 Ohm
Implantable Lead Implant Date: 20160801
Implantable Lead Implant Date: 20160801
Implantable Lead Implant Date: 20160801
Implantable Lead Location: 753858
Implantable Lead Location: 753859
Implantable Lead Location: 753860
Implantable Lead Model: 7122
Implantable Pulse Generator Implant Date: 20160801
Lead Channel Impedance Value: 1175 Ohm
Lead Channel Impedance Value: 410 Ohm
Lead Channel Impedance Value: 460 Ohm
Lead Channel Pacing Threshold Amplitude: 0.875 V
Lead Channel Pacing Threshold Amplitude: 1 V
Lead Channel Pacing Threshold Amplitude: 2 V
Lead Channel Pacing Threshold Pulse Width: 0.5 ms
Lead Channel Pacing Threshold Pulse Width: 0.5 ms
Lead Channel Pacing Threshold Pulse Width: 0.5 ms
Lead Channel Sensing Intrinsic Amplitude: 11.7 mV
Lead Channel Sensing Intrinsic Amplitude: 4.9 mV
Lead Channel Setting Pacing Amplitude: 1.875
Lead Channel Setting Pacing Amplitude: 2.5 V
Lead Channel Setting Pacing Amplitude: 2.5 V
Lead Channel Setting Pacing Pulse Width: 0.5 ms
Lead Channel Setting Pacing Pulse Width: 0.5 ms
Lead Channel Setting Sensing Sensitivity: 0.5 mV
Pulse Gen Serial Number: 7290135

## 2019-01-18 ENCOUNTER — Telehealth (HOSPITAL_COMMUNITY): Payer: Self-pay | Admitting: Licensed Clinical Social Worker

## 2019-01-18 NOTE — Telephone Encounter (Signed)
Pt emailed CSW long term disability paperwork for MD to fill out- paperwork given to MD clinic staff for completion  CSW will continue to follow and assist as needed  Jorge Ny, [place] Worker [facility] [phone]

## 2019-01-19 ENCOUNTER — Ambulatory Visit (HOSPITAL_COMMUNITY)
Admission: RE | Admit: 2019-01-19 | Discharge: 2019-01-19 | Disposition: A | Discharge status: Home / Self Care | Payer: Medicare Other | Source: Ambulatory Visit | Attending: Cardiology | Admitting: Cardiology

## 2019-01-19 ENCOUNTER — Encounter (HOSPITAL_COMMUNITY): Payer: Self-pay | Admitting: Cardiology

## 2019-01-19 VITALS — BP 122/80 | HR 78 | Wt 225.2 lb

## 2019-01-19 DIAGNOSIS — I5022 Chronic systolic (congestive) heart failure: Secondary | ICD-10-CM

## 2019-01-19 DIAGNOSIS — Z79899 Other long term (current) drug therapy: Secondary | ICD-10-CM | POA: Diagnosis not present

## 2019-01-19 DIAGNOSIS — I428 Other cardiomyopathies: Secondary | ICD-10-CM | POA: Diagnosis not present

## 2019-01-19 DIAGNOSIS — I110 Hypertensive heart disease with heart failure: Secondary | ICD-10-CM | POA: Diagnosis not present

## 2019-01-19 DIAGNOSIS — I447 Left bundle-branch block, unspecified: Secondary | ICD-10-CM | POA: Diagnosis not present

## 2019-01-19 DIAGNOSIS — Z87891 Personal history of nicotine dependence: Secondary | ICD-10-CM | POA: Insufficient documentation

## 2019-01-19 DIAGNOSIS — Z8249 Family history of ischemic heart disease and other diseases of the circulatory system: Secondary | ICD-10-CM | POA: Diagnosis not present

## 2019-01-19 LAB — BASIC METABOLIC PANEL
Anion gap: 9 (ref 5–15)
BUN: 15 mg/dL (ref 8–23)
CO2: 22 mmol/L (ref 22–32)
Calcium: 9.8 mg/dL (ref 8.9–10.3)
Chloride: 108 mmol/L (ref 98–111)
Creatinine, Ser: 0.98 mg/dL (ref 0.61–1.24)
GFR calc Af Amer: >60 mL/min (ref >60)
GFR calc non Af Amer: >60 mL/min (ref >60)
Glucose, Bld: 100 mg/dL — ABNORMAL HIGH (ref 70–99)
Potassium: 4.4 mmol/L (ref 3.5–5.1)
Sodium: 139 mmol/L (ref 135–145)

## 2019-01-19 MED ORDER — CARVEDILOL 6.25 MG PO TABS: 9.3750 mg | ORAL_TABLET | Freq: Two times a day (BID) | ORAL | 6 refills | Status: DC

## 2019-01-19 NOTE — Patient Instructions (Signed)
INCREASE Coreg to 9.375mg  (1.5 tab) twice a day. A new script has been sent to your pharmacy  Labs today We will only contact you if something comes back abnormal or we need to make some changes. Otherwise no news is good news!  Your physician recommends that you schedule a follow-up appointment in: 6 months. Please call us in May if you do not receive a call for this appointment.

## 2019-01-21 NOTE — Progress Notes (Signed)
Patient ID: Isaiah Wu, male   DOB: 1951-11-25, 68 y.o.   MRN: [id number] PCP: Dr. Rex Kras HF Cardiology: Dr. Aundra Dubin  68 y.o. with minimal PMH diagnosed with systolic HF in 3/15.Echo showed EF 15% with diffuse hypokinesis.  Initially seen by Dr. [name]. He was admitted to [facility] in 4/16 for diuresis.  RHC/LHC showed elevated filling pressures and no significant coronary disease.  Underwent St Jude BiV-ICD on 07/21/15.  Repeat echo in 2/17 showed EF up to 40-45%.  Echo in 10/18 showed EF up to 50-55%.  Echo in 10/19 with EF 45-50%.   He returns for followup of CHF/dyspnea.  He is stable, rarely takes Lasix (usually after [place] or [place] food).  No dyspnea walking on flat ground.  Mild lightheadedness if he stands up fast after sitting for a prolonged period.  SBP in 100s-120s.    St Jude device interrogation: >99% BiV paced, no AF/VT, normal thoracic impedance.    Labs 4/16: TSH normal, SPEP negative, ferritin normal, HIV negative, K 4.6, creatinine 1.22, HCT 46.9 Labs 6/16: K 3.8, Creatinine 1.08 Labs 07/18/15: K 4.1, Creatinine 0.99 Labs 9/16: digoxin 0.3, K 4.3, creatinine 1.03 Labs 11/16: K 4.3, creatinine 1.09, digoxin 0.6, BNP 23 Labs 2/17: K 4.2, creatinine 1.13, BNP 15.9, digoxin 0.4 Labs 3/17: K 4.2, creatinine 1.09, LDL 65 Labs 6/17: digoxin 0.3 Labs 7/17: K 4.3, creatinine 1.19 Labs 8/17: digoxin 0.5, K 3.9, creatinine 1.15 Labs 4/18: LDL 71 Labs 7/18: K 4, creatinine 1.15 Labs 10/18: K 4, creatinine 1.03, TSH normal, hgb 15.9 Labs 7/19: LDL 98 Labs 10/19: K 4.1, creatinine 1.13  PMH: 1. HTN 2. GERD 3. LBBB 4. Cardiomyopathy: Nonischemic.  Echo (4/16) with EF 15%, severe diffuse hypokinesis [name]).  RHC/LHC (4/16) with normal coronaries; mean RA 10, PA 58/35 mean 48, mean PCWP 32, no comment on CO.  HIV negative, TSH normal, ferritin normal, SPEP normal. S/p St Jude CRT-D 07/21/15.  - cMRI  04/2015 with severely dilated LV, EF 14%, septal-lateral dyssynchrony (prominent), normal  RV size with mild to moderately decreased systolic function, at least moderate functional mitral regurgitation, there was basal inferoseptal subepicardial LGE (small area) and apical septal subtle mid-wall LGE (small area).   - Echo (9/16) with EF 25-30%, mild MR => improved.  - CPX (1/17) with VE/VCO2 26.2, peak VO2 20.3, RER 1.13 => mild heart failure limitation.   - Echo (2/17) EF 40-45%.   - Echo (4/18) with EF 50-55% - Echo (10/18): EF 50-55%, mild RV dilation with normal systolic function.  - Echo (10/19): EF 45-50%, RV mildly dilated, normal systolic function.  5. Syncopal episode 6/16: Monitor ok. Suspect orthostasis.    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  No cardiomyopathy or sudden death.   ROS: All systems reviewed and negative except as per HPI.   Current Outpatient Medications  Medication Sig Dispense Refill  . carvedilol (COREG) 6.25 MG tablet Take 1.5 tablets (9.375 mg total) by mouth 2 (two) times daily with a meal. 90 tablet 6  . furosemide (LASIX) 20 MG tablet Take 1 tablet (20 mg total) by mouth daily as needed. 90 tablet 2  . Multiple Vitamin (MULTIVITAMIN) tablet Take 1 tablet by mouth daily.    . sacubitril-valsartan (ENTRESTO) 49-51 MG Take 1 tablet by mouth 2 (two) times daily. 60 tablet 11  . spironolactone (ALDACTONE) 25 MG  tablet Take 1 tablet (25 mg total) by mouth daily. 90 tablet 3   No current facility-administered medications for this encounter.    BP 122/80   Pulse 78   Wt 102.2 kg (225 lb 3.2 oz)   SpO2 98%   BMI 30.54 kg/m  General: NAD Neck: No JVD, no thyromegaly or thyroid nodule.  Lungs: Clear to auscultation bilaterally with normal respiratory effort. CV: Nondisplaced PMI.  Heart regular S1/S2, no S3/S4, no murmur.  No peripheral edema.  No carotid bruit.  Normal pedal pulses.  Abdomen: Soft, nontender, no  hepatosplenomegaly, no distention.  Skin: Intact without lesions or rashes.  Neurologic: Alert and oriented x 3.  Psych: Normal affect. Extremities: No clubbing or cyanosis.  HEENT: Normal.   Assessment/Plan:  1. Chronic systolic CHF: Nonischemic cardiomyopathy, EF 15% initially, up to 45-50% on 10/19 echo with medical treatment and St Jude CRT-D. Etiology of cardiomyopathy is uncertain: normal coronaries, no symptoms suggestive of viral syndrome prior to admission, HIV/Ferritin/SPEP/TSH unremarkable.  No history of familial CMP.  He has LBBB of uncertain duration.  ? LBBB cardiomyopathy.  However, there was also mid-wall LGE in the septum on cardiac MRI, suggesting possibility of myocarditis.  CPX in 1/17 suggested only a mild heart failure limitation.  NYHA class II.  He is euvolemic on exam with weight up 4 lbs.  - Continue spironolactone 25 mg daily, current Entresto.  BMET today.  - Increase Coreg 9.375 mg bid.        2. LBBB: Of uncertain duration.   Followup in 6 months.    Aerielle Stoklosa,MD 01/21/2019

## 2019-01-25 ENCOUNTER — Telehealth (HOSPITAL_COMMUNITY): Payer: Self-pay

## 2019-01-25 NOTE — Telephone Encounter (Signed)
Called pt to clarify information for his disability paper work. Left vm to call back

## 2019-01-29 ENCOUNTER — Telehealth (HOSPITAL_COMMUNITY): Payer: Self-pay

## 2019-01-29 NOTE — Telephone Encounter (Signed)
Called pt to clarify information for disability paperwork. Left voicemail

## 2019-02-02 ENCOUNTER — Telehealth (HOSPITAL_COMMUNITY): Payer: Self-pay

## 2019-02-02 NOTE — Telephone Encounter (Signed)
Spoke with pt patient about disability paperwork. Its ready and pt stated that he would pick them up Monday. Advised them that its in the front office.

## 2019-03-05 ENCOUNTER — Other Ambulatory Visit: Payer: Self-pay

## 2019-03-05 ENCOUNTER — Ambulatory Visit (INDEPENDENT_AMBULATORY_CARE_PROVIDER_SITE_OTHER): Payer: Medicare Other | Admitting: *Deleted

## 2019-03-05 DIAGNOSIS — I5022 Chronic systolic (congestive) heart failure: Secondary | ICD-10-CM

## 2019-03-05 DIAGNOSIS — I428 Other cardiomyopathies: Secondary | ICD-10-CM

## 2019-03-06 LAB — CUP PACEART REMOTE DEVICE CHECK
Battery Remaining Longevity: 31 mo
Battery Remaining Percentage: 44 %
Battery Voltage: 2.92 V
Brady Statistic AP VP Percent: 14 %
Brady Statistic AP VS Percent: 1 %
Brady Statistic AS VP Percent: 86 %
Brady Statistic AS VS Percent: 1 %
Brady Statistic RA Percent Paced: 14 %
Date Time Interrogation Session: 20200316060937
HighPow Impedance: 71 Ohm
HighPow Impedance: 71 Ohm
Implantable Lead Implant Date: 20160801
Implantable Lead Implant Date: 20160801
Implantable Lead Implant Date: 20160801
Implantable Lead Location: 753858
Implantable Lead Location: 753859
Implantable Lead Location: 753860
Implantable Lead Model: 7122
Implantable Pulse Generator Implant Date: 20160801
Lead Channel Impedance Value: 1075 Ohm
Lead Channel Impedance Value: 410 Ohm
Lead Channel Impedance Value: 450 Ohm
Lead Channel Pacing Threshold Amplitude: 0.75 V
Lead Channel Pacing Threshold Amplitude: 1 V
Lead Channel Pacing Threshold Amplitude: 2 V
Lead Channel Pacing Threshold Pulse Width: 0.5 ms
Lead Channel Pacing Threshold Pulse Width: 0.5 ms
Lead Channel Pacing Threshold Pulse Width: 0.5 ms
Lead Channel Sensing Intrinsic Amplitude: 11.7 mV
Lead Channel Sensing Intrinsic Amplitude: 5 mV
Lead Channel Setting Pacing Amplitude: 1.75 V
Lead Channel Setting Pacing Amplitude: 2.5 V
Lead Channel Setting Pacing Amplitude: 2.5 V
Lead Channel Setting Pacing Pulse Width: 0.5 ms
Lead Channel Setting Pacing Pulse Width: 0.5 ms
Lead Channel Setting Sensing Sensitivity: 0.5 mV
Pulse Gen Serial Number: 7290135

## 2019-03-13 NOTE — Progress Notes (Signed)
Remote ICD transmission.   

## 2019-06-11 ENCOUNTER — Ambulatory Visit (INDEPENDENT_AMBULATORY_CARE_PROVIDER_SITE_OTHER): Payer: Medicare Other | Admitting: *Deleted

## 2019-06-11 DIAGNOSIS — I5022 Chronic systolic (congestive) heart failure: Secondary | ICD-10-CM

## 2019-06-11 DIAGNOSIS — I428 Other cardiomyopathies: Secondary | ICD-10-CM | POA: Diagnosis not present

## 2019-06-11 LAB — CUP PACEART REMOTE DEVICE CHECK
Battery Remaining Longevity: 29 mo
Battery Remaining Percentage: 39 %
Battery Voltage: 2.92 V
Brady Statistic AP VP Percent: 14 %
Brady Statistic AP VS Percent: 1 %
Brady Statistic AS VP Percent: 85 %
Brady Statistic AS VS Percent: 1 %
Brady Statistic RA Percent Paced: 14 %
Date Time Interrogation Session: 20200620204432
HighPow Impedance: 71 Ohm
HighPow Impedance: 71 Ohm
Implantable Lead Implant Date: 20160801
Implantable Lead Implant Date: 20160801
Implantable Lead Implant Date: 20160801
Implantable Lead Location: 753858
Implantable Lead Location: 753859
Implantable Lead Location: 753860
Implantable Lead Model: 7122
Implantable Pulse Generator Implant Date: 20160801
Lead Channel Impedance Value: 1125 Ohm
Lead Channel Impedance Value: 410 Ohm
Lead Channel Impedance Value: 450 Ohm
Lead Channel Pacing Threshold Amplitude: 0.75 V
Lead Channel Pacing Threshold Amplitude: 1 V
Lead Channel Pacing Threshold Amplitude: 2 V
Lead Channel Pacing Threshold Pulse Width: 0.5 ms
Lead Channel Pacing Threshold Pulse Width: 0.5 ms
Lead Channel Pacing Threshold Pulse Width: 0.5 ms
Lead Channel Sensing Intrinsic Amplitude: 11.7 mV
Lead Channel Sensing Intrinsic Amplitude: 3.9 mV
Lead Channel Setting Pacing Amplitude: 1.75 V
Lead Channel Setting Pacing Amplitude: 2.5 V
Lead Channel Setting Pacing Amplitude: 2.5 V
Lead Channel Setting Pacing Pulse Width: 0.5 ms
Lead Channel Setting Pacing Pulse Width: 0.5 ms
Lead Channel Setting Sensing Sensitivity: 0.5 mV
Pulse Gen Serial Number: 7290135

## 2019-06-19 NOTE — Progress Notes (Signed)
Remote ICD transmission.   

## 2019-09-03 ENCOUNTER — Telehealth: Payer: Self-pay | Admitting: Internal Medicine

## 2019-09-03 NOTE — Telephone Encounter (Signed)
I spoke with pt to let him know I rescheduled his home remote appointment for 09-17-2019. The pt thanked me for the call.

## 2019-09-03 NOTE — Telephone Encounter (Signed)
New message   Pt called this morning stating that he would be out of town on vacation 09.22.20 when he has his scheduled remote check. He said he needs to RS and will be back in town on 9.28.20.

## 2019-09-04 ENCOUNTER — Encounter: Payer: Medicare Other | Admitting: Nurse Practitioner

## 2019-09-08 ENCOUNTER — Other Ambulatory Visit (HOSPITAL_COMMUNITY): Payer: Self-pay | Admitting: Cardiology

## 2019-09-17 ENCOUNTER — Ambulatory Visit (INDEPENDENT_AMBULATORY_CARE_PROVIDER_SITE_OTHER): Payer: Medicare Other | Admitting: *Deleted

## 2019-09-17 DIAGNOSIS — I5022 Chronic systolic (congestive) heart failure: Secondary | ICD-10-CM

## 2019-09-17 DIAGNOSIS — I428 Other cardiomyopathies: Secondary | ICD-10-CM

## 2019-09-17 LAB — CUP PACEART REMOTE DEVICE CHECK
Battery Remaining Longevity: 25 mo
Battery Remaining Percentage: 35 %
Battery Voltage: 2.9 V
Brady Statistic AP VP Percent: 15 %
Brady Statistic AP VS Percent: 1 %
Brady Statistic AS VP Percent: 85 %
Brady Statistic AS VS Percent: 1 %
Brady Statistic RA Percent Paced: 15 %
Date Time Interrogation Session: 20200928060016
HighPow Impedance: 77 Ohm
HighPow Impedance: 77 Ohm
Implantable Lead Implant Date: 20160801
Implantable Lead Implant Date: 20160801
Implantable Lead Implant Date: 20160801
Implantable Lead Location: 753858
Implantable Lead Location: 753859
Implantable Lead Location: 753860
Implantable Lead Model: 7122
Implantable Pulse Generator Implant Date: 20160801
Lead Channel Impedance Value: 1125 Ohm
Lead Channel Impedance Value: 450 Ohm
Lead Channel Impedance Value: 460 Ohm
Lead Channel Pacing Threshold Amplitude: 0.875 V
Lead Channel Pacing Threshold Amplitude: 1 V
Lead Channel Pacing Threshold Amplitude: 2 V
Lead Channel Pacing Threshold Pulse Width: 0.5 ms
Lead Channel Pacing Threshold Pulse Width: 0.5 ms
Lead Channel Pacing Threshold Pulse Width: 0.5 ms
Lead Channel Sensing Intrinsic Amplitude: 11.7 mV
Lead Channel Sensing Intrinsic Amplitude: 4.7 mV
Lead Channel Setting Pacing Amplitude: 1.875
Lead Channel Setting Pacing Amplitude: 2.5 V
Lead Channel Setting Pacing Amplitude: 2.5 V
Lead Channel Setting Pacing Pulse Width: 0.5 ms
Lead Channel Setting Pacing Pulse Width: 0.5 ms
Lead Channel Setting Sensing Sensitivity: 0.5 mV
Pulse Gen Serial Number: 7290135

## 2019-09-24 ENCOUNTER — Ambulatory Visit (HOSPITAL_COMMUNITY)
Admission: RE | Admit: 2019-09-24 | Discharge: 2019-09-24 | Disposition: A | Discharge status: Home / Self Care | Payer: Medicare Other | Source: Ambulatory Visit | Attending: Cardiology | Admitting: Cardiology

## 2019-09-24 ENCOUNTER — Other Ambulatory Visit: Payer: Self-pay

## 2019-09-24 ENCOUNTER — Encounter (HOSPITAL_COMMUNITY): Payer: Self-pay | Admitting: Cardiology

## 2019-09-24 VITALS — BP 118/79 | HR 79 | Wt 220.6 lb

## 2019-09-24 DIAGNOSIS — Z79899 Other long term (current) drug therapy: Secondary | ICD-10-CM | POA: Diagnosis not present

## 2019-09-24 DIAGNOSIS — I5022 Chronic systolic (congestive) heart failure: Secondary | ICD-10-CM | POA: Insufficient documentation

## 2019-09-24 DIAGNOSIS — Z87891 Personal history of nicotine dependence: Secondary | ICD-10-CM | POA: Insufficient documentation

## 2019-09-24 DIAGNOSIS — I428 Other cardiomyopathies: Secondary | ICD-10-CM | POA: Insufficient documentation

## 2019-09-24 DIAGNOSIS — I447 Left bundle-branch block, unspecified: Secondary | ICD-10-CM | POA: Diagnosis not present

## 2019-09-24 DIAGNOSIS — Z7901 Long term (current) use of anticoagulants: Secondary | ICD-10-CM | POA: Diagnosis not present

## 2019-09-24 DIAGNOSIS — K219 Gastro-esophageal reflux disease without esophagitis: Secondary | ICD-10-CM | POA: Diagnosis not present

## 2019-09-24 DIAGNOSIS — R0600 Dyspnea, unspecified: Secondary | ICD-10-CM | POA: Insufficient documentation

## 2019-09-24 DIAGNOSIS — I110 Hypertensive heart disease with heart failure: Secondary | ICD-10-CM | POA: Diagnosis not present

## 2019-09-24 LAB — BASIC METABOLIC PANEL
Anion gap: 12 (ref 5–15)
BUN: 18 mg/dL (ref 8–23)
CO2: 22 mmol/L (ref 22–32)
Calcium: 9.5 mg/dL (ref 8.9–10.3)
Chloride: 105 mmol/L (ref 98–111)
Creatinine, Ser: 1.2 mg/dL (ref 0.61–1.24)
GFR calc Af Amer: >60 mL/min (ref >60)
GFR calc non Af Amer: >60 mL/min (ref >60)
Glucose, Bld: 138 mg/dL — ABNORMAL HIGH (ref 70–99)
Potassium: 3.9 mmol/L (ref 3.5–5.1)
Sodium: 139 mmol/L (ref 135–145)

## 2019-09-24 LAB — LIPID PANEL
Cholesterol: 165 mg/dL (ref 0–200)
HDL: 55 mg/dL (ref >40)
LDL Cholesterol: 90 mg/dL (ref 0–99)
Total CHOL/HDL Ratio: 3 RATIO
Triglycerides: 102 mg/dL (ref <150)
VLDL: 20 mg/dL (ref 0–40)

## 2019-09-24 MED ORDER — CARVEDILOL 12.5 MG PO TABS: 12.5000 mg | ORAL_TABLET | Freq: Two times a day (BID) | ORAL | 3 refills | Status: DC

## 2019-09-24 NOTE — Progress Notes (Signed)
Patient ID: Isaiah Wu, male   DOB: March 21, 1951, 68 y.o.   MRN: [id number] PCP: Dr. Rex Kras HF Cardiology: Dr. Aundra Dubin  68 y.o. with minimal PMH diagnosed with systolic HF in [id number].Echo showed EF 15% with diffuse hypokinesis.  Initially seen by Dr. [name]. He was admitted to [facility] in 4/16 for diuresis.  RHC/LHC showed elevated filling pressures and no significant coronary disease.  Underwent St Jude BiV-ICD on 07/21/15.  Repeat echo in 2/17 showed EF up to 40-45%.  Echo in 10/18 showed EF up to 50-55%.  Echo in 10/19 with EF 45-50%.   He returns for followup of CHF/dyspnea.  He is stable, rarely takes Lasix (usually after [place] or [place] food).  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  No significant exertional dyspnea.  No orthopnea/PND. No chest pain.  No lightheadedness. Weight is down 5 lbs.   Labs 4/16: TSH normal, SPEP negative, ferritin normal, HIV negative, K 4.6, creatinine 1.22, HCT 46.9 Labs 6/16: K 3.8, Creatinine 1.08 Labs 07/18/15: K 4.1, Creatinine 0.99 Labs 9/16: digoxin 0.3, K 4.3, creatinine 1.03 Labs 11/16: K 4.3, creatinine 1.09, digoxin 0.6, BNP 23 Labs 2/17: K 4.2, creatinine 1.13, BNP 15.9, digoxin 0.4 Labs 3/17: K 4.2, creatinine 1.09, LDL 65 Labs 6/17: digoxin 0.3 Labs 7/17: K 4.3, creatinine 1.19 Labs 8/17: digoxin 0.5, K 3.9, creatinine 1.15 Labs 4/18: LDL 71 Labs 7/18: K 4, creatinine 1.15 Labs 10/18: K 4, creatinine 1.03, TSH normal, hgb 15.9 Labs 7/19: LDL 98 Labs 10/19: K 4.1, creatinine 1.13  Labs 1/20: creatinine 0.98  PMH: 1. HTN 2. GERD 3. LBBB 4. Cardiomyopathy: Nonischemic.  Echo (4/16) with EF 15%, severe diffuse hypokinesis [name]).  RHC/LHC (4/16) with normal coronaries; mean RA 10, PA 58/35 mean 48, mean PCWP 32, no comment on CO.  HIV negative, TSH normal, ferritin normal, SPEP normal. S/p St Jude CRT-D 07/21/15.  - cMRI  04/2015 with severely dilated LV, EF 14%, septal-lateral dyssynchrony (prominent),  normal RV size with mild to moderately decreased systolic function, at least moderate functional mitral regurgitation, there was basal inferoseptal subepicardial LGE (small area) and apical septal subtle mid-wall LGE (small area).   - Echo (9/16) with EF 25-30%, mild MR => improved.  - CPX (1/17) with VE/VCO2 26.2, peak VO2 20.3, RER 1.13 => mild heart failure limitation.   - Echo (2/17) EF 40-45%.   - Echo (4/18) with EF 50-55% - Echo (10/18): EF 50-55%, mild RV dilation with normal systolic function.  - Echo (10/19): EF 45-50%, RV mildly dilated, normal systolic function.  5. Syncopal episode 6/16: Monitor ok. Suspect orthostasis.    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  No cardiomyopathy or sudden death.   ROS: All systems reviewed and negative except as per HPI.   Current Outpatient Medications  Medication Sig Dispense Refill  . carvedilol (COREG) 12.5 MG tablet Take 1 tablet (12.5 mg total) by mouth 2 (two) times daily with a meal. 180 tablet 3  . furosemide (LASIX) 20 MG tablet Take 1 tablet (20 mg total) by mouth daily as needed. 90 tablet 2  . Multiple Vitamin (MULTIVITAMIN) tablet Take 1 tablet by mouth daily.    . sacubitril-valsartan (ENTRESTO) 49-51 MG Take 1 tablet by mouth 2 (two) times daily. 60 tablet 11  . spironolactone (ALDACTONE) 25 MG tablet  Take 1 tablet (25 mg total) by mouth daily. 90 tablet 3   No current facility-administered medications for this encounter.    BP 118/79   Pulse 79   Wt 100.1 kg (220 lb 9.6 oz)   SpO2 97%   BMI 29.92 kg/m  General: NAD Neck: No JVD, no thyromegaly or thyroid nodule.  Lungs: Clear to auscultation bilaterally with normal respiratory effort. CV: Nondisplaced PMI.  Heart regular S1/S2, no S3/S4, no murmur.  No peripheral edema.  No carotid bruit.  Normal pedal pulses.  Abdomen: Soft, nontender, no  hepatosplenomegaly, no distention.  Skin: Intact without lesions or rashes.  Neurologic: Alert and oriented x 3.  Psych: Normal affect. Extremities: No clubbing or cyanosis.  HEENT: Normal.   Assessment/Plan:  1. Chronic systolic CHF: Nonischemic cardiomyopathy, EF 15% initially, up to 45-50% on 10/19 echo with medical treatment and St Jude CRT-D. Etiology of cardiomyopathy is uncertain: normal coronaries, no symptoms suggestive of viral syndrome prior to admission, HIV/Ferritin/SPEP/TSH unremarkable.  No history of familial CMP.  He has LBBB of uncertain duration.  ? LBBB cardiomyopathy.  However, there was also mid-wall LGE in the septum on cardiac MRI, suggesting possibility of myocarditis.  CPX in 1/17 suggested only a mild heart failure limitation.  NYHA class II.  He is euvolemic on exam with weight down 5 lbs.  - Continue spironolactone 25 mg daily, Entresto 49/51 bid.  BMET today.  - Increase Coreg to 12.5 mg bid.  - I will arrange for repeat echo.         2. LBBB: Of uncertain duration.   Followup in 6 months if EF remains stable.     Nataline Basara,MD 09/24/2019

## 2019-09-24 NOTE — Patient Instructions (Signed)
INCREASE Coreg to 12.5mg  (1 tab) twice a day  Your physician has requested that you have an echocardiogram. Echocardiography is a painless test that uses sound waves to create images of your heart. It provides your doctor with information about the size and shape of your heart and how well your heart's chambers and valves are working. This procedure takes approximately one hour. There are no restrictions for this procedure.  Labs today We will only contact you if something comes back abnormal or we need to make some changes. Otherwise no news is good news!  Your physician recommends that you schedule a follow-up appointment in: 6 months with Dr Aundra Dubin, our office will call you to schedule your appointment.  At the [facility], you and your health needs are our priority. As part of our continuing mission to provide you with exceptional heart care, we have created designated Provider Care Teams. These Care Teams include your primary Cardiologist (physician) and Advanced Practice Providers (APPs- Physician Assistants and Nurse Practitioners) who all work together to provide you with the care you need, when you need it.   You may see any of the following providers on your designated Care Team at your next follow up: Marland Kitchen Dr Glori Bickers . Dr Loralie Champagne . Darrick Grinder, NP   Please be sure to bring in all your medications bottles to every appointment.

## 2019-09-26 NOTE — Progress Notes (Signed)
Remote ICD transmission.   

## 2019-09-28 ENCOUNTER — Other Ambulatory Visit (HOSPITAL_COMMUNITY): Payer: Self-pay | Admitting: Cardiology

## 2019-10-02 NOTE — Progress Notes (Signed)
Electrophysiology Office Note Date: 10/04/2019  ID:  Isaiah Wu, DOB 30-Jul-1951, MRN [id number]  PCP: Hulan Fess, MD Primary Cardiologist: Aundra Dubin Electrophysiologist: Caryl Comes  CC: Routine ICD follow-up  Isaiah Wu is a 68 y.o. male seen today for Dr Caryl Comes.  He presents today for routine electrophysiology followup.  Since last being seen in our clinic, the patient reports doing very well. He denies chest pain, palpitations, dyspnea, PND, orthopnea, nausea, vomiting, dizziness, syncope, edema, weight gain, or early satiety.  He has not had ICD shocks.   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 Cardiomyopathy ([place]) 04/07/2015  . Chronic systolic CHF (congestive heart failure) ([place]) 04/14/2015   a. s/p STJ CRTD b. EF normalized with CRT therapy  . GERD (gastroesophageal reflux disease)   . Hypertension   . LBBB (left bundle branch block)   . Syncope 05/30/2015   Past Surgical History:  Procedure Laterality Date  . CARDIAC CATHETERIZATION    . EP IMPLANTABLE DEVICE N/A 07/21/2015   STJ CRTD implanted by Dr Caryl Comes for NICM, CHF  . FRACTURE SURGERY    . LEFT AND RIGHT HEART CATHETERIZATION WITH CORONARY ANGIOGRAM N/A 04/09/2015   Procedure: LEFT AND RIGHT HEART CATHETERIZATION WITH CORONARY ANGIOGRAM;  Surgeon: Jacolyn Reedy, MD;  Location: [facility] CATH LAB;  Service: Cardiovascular;  Laterality: N/A;  . ORIF ELBOW FRACTURE Left 1981  . SHOULDER SURGERY Left 05/2008   "had to put cadavear bone in and reattach muscle to it"  . TONSILLECTOMY AND ADENOIDECTOMY  1960's    Current Outpatient Medications  Medication Sig Dispense Refill  . carvedilol (COREG) 12.5 MG tablet Take 1 tablet (12.5 mg total) by mouth 2 (two) times daily with a meal. 180 tablet 3  . furosemide (LASIX) 20 MG tablet TAKE 1 TABLET BY MOUTH EVERY DAY AS NEEDED 90 tablet 2  . Multiple Vitamin  (MULTIVITAMIN) tablet Take 1 tablet by mouth daily.    . sacubitril-valsartan (ENTRESTO) 49-51 MG Take 1 tablet by mouth 2 (two) times daily. 60 tablet 11  . spironolactone (ALDACTONE) 25 MG tablet TAKE 1 TABLET BY MOUTH EVERY DAY 90 tablet 3   No current facility-administered medications for this visit.     Allergies:   Patient has no known allergies.   Social History: Social History   Socioeconomic History  . Marital status: Married    Spouse name: Not on file  . Number of children: Not on file  . Years of education: Not on file  . Highest education level: Not on file  Occupational History  . Not on file  Social Needs  . Financial resource strain: Not on file  . Food insecurity    Worry: Not on file    Inability: Not on file  . Transportation needs    Medical: Not on file    Non-medical: Not on file  Tobacco Use  . Smoking status: Former Smoker    Packs/day: 1.00    Years: 2.00    Pack years: 2.00    Types: Cigarettes  . Smokeless tobacco: Never Used  . Tobacco comment: 'quit smoking in the 1970's"  Substance and Sexual Activity  . Alcohol use: Yes    Alcohol/week: 14.0 standard drinks    Types: 14 Glasses of wine per week    Comment: 07/21/2015 "1, 8oz glass of wine/day; I'm stopping now"  . Drug use: No  .  Sexual activity: Yes  Lifestyle  . Physical activity    Days per week: Not on file    Minutes per session: Not on file  . Stress: Not on file  Relationships  . Social Herbalist on phone: Not on file    Gets together: Not on file    Attends religious service: Not on file    Active member of club or organization: Not on file    Attends meetings of clubs or organizations: Not on file    Relationship status: Not on file  . Intimate partner violence    Fear of current or ex partner: Not on file    Emotionally abused: Not on file    Physically abused: Not on file    Forced sexual activity: Not on file  Other Topics Concern  . Not on file  Social  History Narrative  . Not on file    Family History: Family History  Problem Relation Age of Onset  . Bone cancer Father   . Diabetes Father   . CVA Mother     Review of Systems: All other systems reviewed and are otherwise negative except as noted above.   Physical Exam: VS:  BP 122/76   Pulse 74   Ht 6' (1.829 m)   Wt 220 lb (99.8 kg)   SpO2 97%   BMI 29.84 kg/m  , BMI Body mass index is 29.84 kg/m.  GEN- The patient is well appearing, alert and oriented x 3 today.   HEENT: normocephalic, atraumatic; sclera clear, conjunctiva pink; hearing intact; oropharynx clear; neck supple  Lungs- Clear to ausculation bilaterally, normal work of breathing.  No wheezes, rales, rhonchi Heart- Regular rate and rhythm  GI- soft, non-tender, non-distended, bowel sounds present  Extremities- no clubbing, cyanosis, or edema  MS- no significant deformity or atrophy Skin- warm and dry, no rash or lesion; ICD pocket well healed Psych- euthymic mood, full affect Neuro- strength and sensation are intact  ICD interrogation- reviewed in detail today,  See PACEART report  EKG:  EKG is not ordered today.  Recent Labs: 09/24/2019: BUN 18; Creatinine, Ser 1.20; Potassium 3.9; Sodium 139   Wt Readings from Last 3 Encounters:  10/04/19 220 lb (99.8 kg)  09/24/19 220 lb 9.6 oz (100.1 kg)  01/19/19 225 lb 3.2 oz (102.2 kg)     Other studies Reviewed: Additional studies/ records that were reviewed today include: Dr Caryl Comes and Dr Claris Gladden office notes   Assessment and Plan:  1.  Chronic systolic dysfunction euvolemic today EF normalized with CRT Stable on an appropriate medical regimen Normal ICD function See Pace Art report No changes today  2.  HTN Stable No change required today    Current medicines are reviewed at length with the patient today.   The patient does not have concerns regarding his medicines.  The following changes were made today:  none  Labs/ tests ordered today  include: none Orders Placed This Encounter  Procedures  . CUP PACEART INCLINIC DEVICE CHECK     Disposition:   Follow up with Delilah Shan, Dr Aundra Dubin as scheduled, Dr Caryl Comes 1 year    Signed, Chanetta Marshall, NP 10/04/2019 8:44 AM  [place]  [id number] [phone] (office) [phone] (fax)

## 2019-10-04 ENCOUNTER — Ambulatory Visit: Payer: Medicare Other | Admitting: Nurse Practitioner

## 2019-10-04 ENCOUNTER — Other Ambulatory Visit: Payer: Self-pay

## 2019-10-04 ENCOUNTER — Encounter: Payer: Self-pay | Admitting: Nurse Practitioner

## 2019-10-04 VITALS — BP 122/76 | HR 74 | Ht 72.0 in | Wt 220.0 lb

## 2019-10-04 DIAGNOSIS — I10 Essential (primary) hypertension: Secondary | ICD-10-CM | POA: Diagnosis not present

## 2019-10-04 DIAGNOSIS — I5022 Chronic systolic (congestive) heart failure: Secondary | ICD-10-CM | POA: Diagnosis not present

## 2019-10-04 LAB — CUP PACEART INCLINIC DEVICE CHECK
Date Time Interrogation Session: 20201015084414
Implantable Lead Implant Date: 20160801
Implantable Lead Implant Date: 20160801
Implantable Lead Implant Date: 20160801
Implantable Lead Location: 753858
Implantable Lead Location: 753859
Implantable Lead Location: 753860
Implantable Lead Model: 7122
Implantable Pulse Generator Implant Date: 20160801
Pulse Gen Serial Number: 7290135

## 2019-10-04 NOTE — Patient Instructions (Addendum)
Medication Instructions:  none If you need a refill on your cardiac medications before your next appointment, please call your pharmacy.   Lab work: none If you have labs (blood work) drawn today and your tests are completely normal, you will receive your results only by: Marland Kitchen MyChart Message (if you have MyChart) OR . A paper copy in the mail If you have any lab test that is abnormal or we need to change your treatment, we will call you to review the results.  Testing/Procedures: none  Follow-Up:  1 year with Dr Caryl Comes At [facility], you and your health needs are our priority.  As part of our continuing mission to provide you with exceptional heart care, we have created designated Provider Care Teams.  These Care Teams include your primary Cardiologist (physician) and Advanced Practice Providers (APPs -  Physician Assistants and Nurse Practitioners) who all work together to provide you with the care you need, when you need it.   Any Other Special Instructions Will Be Listed Below (If Applicable). Remote monitoring is used to monitor your ICD from home. This monitoring reduces the number of office visits required to check your device to one time per year. It allows [place] to keep an eye on the functioning of your device to ensure it is working properly. You are scheduled for a device check from home on 12/17/19. You may send your transmission at any time that day. If you have a wireless device, the transmission will be sent automatically. After your physician reviews your transmission, you will receive a postcard with your next transmission date.

## 2019-11-29 ENCOUNTER — Telehealth (HOSPITAL_COMMUNITY): Payer: Self-pay | Admitting: Pharmacist

## 2019-11-29 NOTE — Telephone Encounter (Signed)
Filled out prescriber portion for [employer] patient assistance application for Entresto. Will leave at front desk as patient wishes to send in completed application himself.   Audry Riles, PharmD, BCPS, BCCP, CPP Heart Failure Clinic Pharmacist [phone]

## 2019-12-17 ENCOUNTER — Ambulatory Visit (INDEPENDENT_AMBULATORY_CARE_PROVIDER_SITE_OTHER): Payer: Medicare Other | Admitting: *Deleted

## 2019-12-17 DIAGNOSIS — I428 Other cardiomyopathies: Secondary | ICD-10-CM | POA: Diagnosis not present

## 2019-12-17 LAB — CUP PACEART REMOTE DEVICE CHECK
Battery Remaining Longevity: 23 mo
Battery Remaining Percentage: 31 %
Battery Voltage: 2.9 V
Brady Statistic AP VP Percent: 25 %
Brady Statistic AP VS Percent: 1 %
Brady Statistic AS VP Percent: 74 %
Brady Statistic AS VS Percent: 1 %
Brady Statistic RA Percent Paced: 25 %
Date Time Interrogation Session: 20201228025146
HighPow Impedance: 77 Ohm
HighPow Impedance: 77 Ohm
Implantable Lead Implant Date: 20160801
Implantable Lead Implant Date: 20160801
Implantable Lead Implant Date: 20160801
Implantable Lead Location: 753858
Implantable Lead Location: 753859
Implantable Lead Location: 753860
Implantable Lead Model: 7122
Implantable Pulse Generator Implant Date: 20160801
Lead Channel Impedance Value: 1150 Ohm
Lead Channel Impedance Value: 410 Ohm
Lead Channel Impedance Value: 450 Ohm
Lead Channel Pacing Threshold Amplitude: 1 V
Lead Channel Pacing Threshold Amplitude: 1.25 V
Lead Channel Pacing Threshold Amplitude: 2 V
Lead Channel Pacing Threshold Pulse Width: 0.5 ms
Lead Channel Pacing Threshold Pulse Width: 0.5 ms
Lead Channel Pacing Threshold Pulse Width: 0.5 ms
Lead Channel Sensing Intrinsic Amplitude: 11.7 mV
Lead Channel Sensing Intrinsic Amplitude: 4.7 mV
Lead Channel Setting Pacing Amplitude: 2 V
Lead Channel Setting Pacing Amplitude: 2.5 V
Lead Channel Setting Pacing Amplitude: 2.5 V
Lead Channel Setting Pacing Pulse Width: 0.5 ms
Lead Channel Setting Pacing Pulse Width: 0.5 ms
Lead Channel Setting Sensing Sensitivity: 0.5 mV
Pulse Gen Serial Number: 7290135

## 2019-12-25 ENCOUNTER — Ambulatory Visit (HOSPITAL_BASED_OUTPATIENT_CLINIC_OR_DEPARTMENT_OTHER)
Admission: RE | Admit: 2019-12-25 | Discharge: 2019-12-25 | Disposition: A | Discharge status: Home / Self Care | Payer: Medicare Other | Source: Ambulatory Visit | Attending: Cardiology | Admitting: Cardiology

## 2019-12-25 ENCOUNTER — Ambulatory Visit (HOSPITAL_COMMUNITY)
Admission: RE | Admit: 2019-12-25 | Discharge: 2019-12-25 | Disposition: A | Discharge status: Home / Self Care | Payer: Medicare Other | Source: Ambulatory Visit | Attending: Cardiology | Admitting: Cardiology

## 2019-12-25 ENCOUNTER — Other Ambulatory Visit: Payer: Self-pay

## 2019-12-25 DIAGNOSIS — I447 Left bundle-branch block, unspecified: Secondary | ICD-10-CM | POA: Diagnosis not present

## 2019-12-25 DIAGNOSIS — K219 Gastro-esophageal reflux disease without esophagitis: Secondary | ICD-10-CM | POA: Insufficient documentation

## 2019-12-25 DIAGNOSIS — I110 Hypertensive heart disease with heart failure: Secondary | ICD-10-CM | POA: Diagnosis not present

## 2019-12-25 DIAGNOSIS — I5022 Chronic systolic (congestive) heart failure: Secondary | ICD-10-CM | POA: Diagnosis not present

## 2019-12-25 DIAGNOSIS — I313 Pericardial effusion (noninflammatory): Secondary | ICD-10-CM | POA: Insufficient documentation

## 2019-12-25 DIAGNOSIS — Z950 Presence of cardiac pacemaker: Secondary | ICD-10-CM | POA: Diagnosis not present

## 2019-12-25 LAB — BASIC METABOLIC PANEL
Anion gap: 11 (ref 5–15)
BUN: 16 mg/dL (ref 8–23)
CO2: 23 mmol/L (ref 22–32)
Calcium: 9.6 mg/dL (ref 8.9–10.3)
Chloride: 103 mmol/L (ref 98–111)
Creatinine, Ser: 1.12 mg/dL (ref 0.61–1.24)
GFR calc Af Amer: >60 mL/min (ref >60)
GFR calc non Af Amer: >60 mL/min (ref >60)
Glucose, Bld: 106 mg/dL — ABNORMAL HIGH (ref 70–99)
Potassium: 4.6 mmol/L (ref 3.5–5.1)
Sodium: 137 mmol/L (ref 135–145)

## 2019-12-25 NOTE — Progress Notes (Signed)
  Echocardiogram 2D Echocardiogram has been performed.  Isaiah Wu 12/25/2019, 11:15 AM

## 2019-12-27 ENCOUNTER — Telehealth (HOSPITAL_COMMUNITY): Payer: Self-pay | Admitting: Vascular Surgery

## 2019-12-27 NOTE — Telephone Encounter (Signed)
Left pt message to make f/u appt  W/ Mclean in April

## 2020-01-08 ENCOUNTER — Telehealth (HOSPITAL_COMMUNITY): Payer: Self-pay | Admitting: Pharmacy Technician

## 2020-01-08 NOTE — Telephone Encounter (Signed)
Advanced Heart Failure Patient Advocate Encounter   Patient was approved to receive Entresto from [employer].  Patient ID: [id number] Effective dates: 01/08/20 through 12/19/20  I left the patient a message with the approval. Left the phone number to call and set up shipment of his medication. Advised patient to call me with any issues.  Charlann Boxer, CPhT

## 2020-01-30 ENCOUNTER — Encounter (HOSPITAL_COMMUNITY): Payer: Self-pay

## 2020-02-02 ENCOUNTER — Ambulatory Visit: Payer: Medicare Other | Attending: Internal Medicine

## 2020-02-02 DIAGNOSIS — Z23 Encounter for immunization: Secondary | ICD-10-CM | POA: Insufficient documentation

## 2020-02-02 NOTE — Progress Notes (Signed)
   Covid-19 Vaccination Clinic  Name:  [facility]    MRN: [id number] DOB: 09/21/1951  02/02/2020  Mr. Ciardi was observed post Covid-19 immunization for 15 minutes without incidence. He was provided with Vaccine Information Sheet and instruction to access the V-Safe system.   Mr. Rausa was instructed to call 911 with any severe reactions post vaccine: Marland Kitchen Difficulty breathing  . Swelling of your face and throat  . A fast heartbeat  . A bad rash all over your body  . Dizziness and weakness    Immunizations Administered    Name Date Dose VIS Date Route   Pfizer COVID-19 Vaccine 02/02/2020  8:39 AM 0.3 mL 11/30/2019 Intramuscular   Manufacturer: [place]   Lot: [id number]   [place]: [id number]

## 2020-02-24 ENCOUNTER — Ambulatory Visit: Payer: Medicare Other | Attending: Internal Medicine

## 2020-02-24 DIAGNOSIS — Z23 Encounter for immunization: Secondary | ICD-10-CM

## 2020-02-24 NOTE — Progress Notes (Signed)
   Covid-19 Vaccination Clinic  Name:  Isaiah Wu    MRN: [id number] DOB: [birth year]  02/24/2020  [name] was observed post Covid-19 immunization for 15 minutes without incident. He was provided with Vaccine Information Sheet and instruction to access the V-Safe system.   [name] was instructed to call 911 with any severe reactions post vaccine: Marland Kitchen Difficulty breathing  . Swelling of face and throat  . A fast heartbeat  . A bad rash all over body  . Dizziness and weakness   Immunizations Administered    Name Date Dose VIS Date Route   Pfizer COVID-19 Vaccine 02/24/2020 10:29 AM 0.3 mL 11/30/2019 Intramuscular   Manufacturer: [place]   Lot: [id number]   [place]: [id number]

## 2020-03-17 ENCOUNTER — Ambulatory Visit (INDEPENDENT_AMBULATORY_CARE_PROVIDER_SITE_OTHER): Payer: Medicare Other | Admitting: *Deleted

## 2020-03-17 DIAGNOSIS — I428 Other cardiomyopathies: Secondary | ICD-10-CM

## 2020-03-17 LAB — CUP PACEART REMOTE DEVICE CHECK
Battery Remaining Longevity: 22 mo
Battery Remaining Percentage: 30 %
Battery Voltage: 2.89 V
Brady Statistic AP VP Percent: 26 %
Brady Statistic AP VS Percent: 1 %
Brady Statistic AS VP Percent: 74 %
Brady Statistic AS VS Percent: 1 %
Brady Statistic RA Percent Paced: 26 %
Date Time Interrogation Session: 20210329060022
HighPow Impedance: 80 Ohm
HighPow Impedance: 80 Ohm
Implantable Lead Implant Date: 20160801
Implantable Lead Implant Date: 20160801
Implantable Lead Implant Date: 20160801
Implantable Lead Location: 753858
Implantable Lead Location: 753859
Implantable Lead Location: 753860
Implantable Lead Model: 7122
Implantable Pulse Generator Implant Date: 20160801
Lead Channel Impedance Value: 1100 Ohm
Lead Channel Impedance Value: 410 Ohm
Lead Channel Impedance Value: 430 Ohm
Lead Channel Pacing Threshold Amplitude: 1 V
Lead Channel Pacing Threshold Amplitude: 1.25 V
Lead Channel Pacing Threshold Amplitude: 2 V
Lead Channel Pacing Threshold Pulse Width: 0.5 ms
Lead Channel Pacing Threshold Pulse Width: 0.5 ms
Lead Channel Pacing Threshold Pulse Width: 0.5 ms
Lead Channel Sensing Intrinsic Amplitude: 11.7 mV
Lead Channel Sensing Intrinsic Amplitude: 4.7 mV
Lead Channel Setting Pacing Amplitude: 2 V
Lead Channel Setting Pacing Amplitude: 2.5 V
Lead Channel Setting Pacing Amplitude: 2.5 V
Lead Channel Setting Pacing Pulse Width: 0.5 ms
Lead Channel Setting Pacing Pulse Width: 0.5 ms
Lead Channel Setting Sensing Sensitivity: 0.5 mV
Pulse Gen Serial Number: 7290135

## 2020-03-17 NOTE — Progress Notes (Signed)
ICD Remote  

## 2020-03-25 ENCOUNTER — Ambulatory Visit (HOSPITAL_COMMUNITY)
Admission: RE | Admit: 2020-03-25 | Discharge: 2020-03-25 | Disposition: A | Discharge status: Home / Self Care | Payer: Medicare Other | Source: Ambulatory Visit | Attending: Cardiology | Admitting: Cardiology

## 2020-03-25 ENCOUNTER — Other Ambulatory Visit: Payer: Self-pay

## 2020-03-25 ENCOUNTER — Encounter (HOSPITAL_COMMUNITY): Payer: Self-pay | Admitting: Cardiology

## 2020-03-25 VITALS — BP 110/70 | HR 70 | Wt 226.4 lb

## 2020-03-25 DIAGNOSIS — Z87891 Personal history of nicotine dependence: Secondary | ICD-10-CM | POA: Diagnosis not present

## 2020-03-25 DIAGNOSIS — I5022 Chronic systolic (congestive) heart failure: Secondary | ICD-10-CM | POA: Diagnosis not present

## 2020-03-25 DIAGNOSIS — Z79899 Other long term (current) drug therapy: Secondary | ICD-10-CM | POA: Diagnosis not present

## 2020-03-25 DIAGNOSIS — I428 Other cardiomyopathies: Secondary | ICD-10-CM | POA: Diagnosis not present

## 2020-03-25 DIAGNOSIS — I110 Hypertensive heart disease with heart failure: Secondary | ICD-10-CM | POA: Insufficient documentation

## 2020-03-25 DIAGNOSIS — I447 Left bundle-branch block, unspecified: Secondary | ICD-10-CM | POA: Diagnosis not present

## 2020-03-25 DIAGNOSIS — Z7901 Long term (current) use of anticoagulants: Secondary | ICD-10-CM | POA: Insufficient documentation

## 2020-03-25 LAB — BASIC METABOLIC PANEL
Anion gap: 10 (ref 5–15)
BUN: 18 mg/dL (ref 8–23)
CO2: 22 mmol/L (ref 22–32)
Calcium: 9.2 mg/dL (ref 8.9–10.3)
Chloride: 107 mmol/L (ref 98–111)
Creatinine, Ser: 1.1 mg/dL (ref 0.61–1.24)
GFR calc Af Amer: >60 mL/min (ref >60)
GFR calc non Af Amer: >60 mL/min (ref >60)
Glucose, Bld: 112 mg/dL — ABNORMAL HIGH (ref 70–99)
Potassium: 4 mmol/L (ref 3.5–5.1)
Sodium: 139 mmol/L (ref 135–145)

## 2020-03-25 NOTE — Progress Notes (Signed)
Patient ID: Ewell Defusco, male   DOB: 01/26/1951, 69 y.o.   MRN: [id number] PCP: Dr. Rex Kras HF Cardiology: Dr. Aundra Dubin  69 y.o. with minimal PMH diagnosed with systolic HF in [id number].Echo showed EF 15% with diffuse hypokinesis.  Initially seen by Dr. [name]. He was admitted to [facility] in 4/16 for diuresis.  RHC/LHC showed elevated filling pressures and no significant coronary disease.  Underwent St Jude BiV-ICD on 07/21/15.  Repeat echo in 2/17 showed EF up to 40-45%.  Echo in 10/18 showed EF up to 50-55%.  Echo in 10/19 with EF 45-50%.  Echo in 1/21 was stable with EF 45-50%.    He returns for followup of CHF/dyspnea.  He is stable, rarely takes Lasix (usually after [place] or [place] food).  Doing well recently, walks daily for exercise and gets 8000-10000 steps/day.  No chest pain.  No significant exertional dyspnea.  Weight up about 6 lbs.   St Jude device interrogation: >99% BiV pacing, no AF/VT, stable thoracic impedance.    Labs 4/16: TSH normal, SPEP negative, ferritin normal, HIV negative, K 4.6, creatinine 1.22, HCT 46.9 Labs 6/16: K 3.8, Creatinine 1.08 Labs 07/18/15: K 4.1, Creatinine 0.99 Labs 9/16: digoxin 0.3, K 4.3, creatinine 1.03 Labs 11/16: K 4.3, creatinine 1.09, digoxin 0.6, BNP 23 Labs 2/17: K 4.2, creatinine 1.13, BNP 15.9, digoxin 0.4 Labs 3/17: K 4.2, creatinine 1.09, LDL 65 Labs 6/17: digoxin 0.3 Labs 7/17: K 4.3, creatinine 1.19 Labs 8/17: digoxin 0.5, K 3.9, creatinine 1.15 Labs 4/18: LDL 71 Labs 7/18: K 4, creatinine 1.15 Labs 10/18: K 4, creatinine 1.03, TSH normal, hgb 15.9 Labs 7/19: LDL 98 Labs 10/19: K 4.1, creatinine 1.13  Labs 1/20: creatinine 0.98 Labs 10/20: LDL 90 Labs 1/21: K 4.6, creatinine 1.12  PMH: 1. HTN 2. GERD 3. LBBB 4. Cardiomyopathy: Nonischemic.  Echo (4/16) with EF 15%, severe diffuse hypokinesis [name]).  RHC/LHC (4/16) with normal coronaries; mean RA 10, PA 58/35 mean 48, mean PCWP 32, no comment on CO.  HIV negative, TSH normal,  ferritin normal, SPEP normal. S/p St Jude CRT-D 07/21/15.  - cMRI  04/2015 with severely dilated LV, EF 14%, septal-lateral dyssynchrony (prominent), normal RV size with mild to moderately decreased systolic function, at least moderate functional mitral regurgitation, there was basal inferoseptal subepicardial LGE (small area) and apical septal subtle mid-wall LGE (small area).   - Echo (9/16) with EF 25-30%, mild MR => improved.  - CPX (1/17) with VE/VCO2 26.2, peak VO2 20.3, RER 1.13 => mild heart failure limitation.   - Echo (2/17) EF 40-45%.   - Echo (4/18) with EF 50-55% - Echo (10/18): EF 50-55%, mild RV dilation with normal systolic function.  - Echo (10/19): EF 45-50%, RV mildly dilated, normal systolic function.  - Echo (1/21): EF 45-50%, low normal RV function.  5. Syncopal episode 6/16: Monitor ok. Suspect orthostasis.    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  No cardiomyopathy or sudden death.   ROS: All systems reviewed and negative except as per HPI.   Current Outpatient Medications  Medication Sig Dispense Refill  . carvedilol (COREG) 12.5 MG tablet Take 1 tablet (12.5 mg total) by mouth 2 (two) times daily with a meal. 180 tablet 3  . furosemide (LASIX) 20 MG tablet TAKE 1 TABLET BY MOUTH EVERY DAY AS NEEDED 90 tablet 2  . Multiple  Vitamin (MULTIVITAMIN) tablet Take 1 tablet by mouth daily.    . sacubitril-valsartan (ENTRESTO) 49-51 MG Take 1 tablet by mouth 2 (two) times daily. 60 tablet 11  . spironolactone (ALDACTONE) 25 MG tablet TAKE 1 TABLET BY MOUTH EVERY DAY 90 tablet 3   No current facility-administered medications for this encounter.   BP 110/70   Pulse 70   Wt 102.7 kg (226 lb 6.4 oz)   SpO2 97%   BMI 30.71 kg/m  General: NAD Neck: No JVD, no thyromegaly or thyroid nodule.  Lungs: Clear to auscultation bilaterally with normal  respiratory effort. CV: Nondisplaced PMI.  Heart regular S1/S2, no S3/S4, no murmur.  No peripheral edema.  No carotid bruit.  Normal pedal pulses.  Abdomen: Soft, nontender, no hepatosplenomegaly, no distention.  Skin: Intact without lesions or rashes.  Neurologic: Alert and oriented x 3.  Psych: Normal affect. Extremities: No clubbing or cyanosis.  HEENT: Normal.   Assessment/Plan:  1. Chronic systolic CHF: Nonischemic cardiomyopathy, EF 15% initially, up to 45-50% on 1/21 echo with medical treatment and St Jude CRT-D. Etiology of cardiomyopathy is uncertain: normal coronaries, no symptoms suggestive of viral syndrome prior to admission, HIV/Ferritin/SPEP/TSH unremarkable.  No history of familial CMP.  He has LBBB of uncertain duration.  ? LBBB cardiomyopathy.  However, there was also mid-wall LGE in the septum on cardiac MRI, suggesting possibility of myocarditis.  CPX in 1/17 suggested only a mild heart failure limitation.  NYHA class II.  He is euvolemic on exam. - Continue spironolactone 25 mg daily, Entresto 49/51 bid.  BMET today.  - Continue Coreg 12.5 mg bid.         2. LBBB: Of uncertain duration.   Followup in 6 months  Theador Hawthorne 03/25/2020

## 2020-03-25 NOTE — Patient Instructions (Addendum)
No medication changes today!   Labs today We will only contact you if something comes back abnormal or we need to make some changes. Otherwise no news is good news!   Your physician recommends that you schedule a follow-up appointment in: 6 months with Dr Mclean. Please call us in August to schedule this appointment.   Please call office at [phone] option 2 if you have any questions or concerns.   At the [facility], you and your health needs are our priority. As part of our continuing mission to provide you with exceptional heart care, we have created designated Provider Care Teams. These Care Teams include your primary Cardiologist (physician) and Advanced Practice Providers (APPs- Physician Assistants and Nurse Practitioners) who all work together to provide you with the care you need, when you need it.   You may see any of the following providers on your designated Care Team at your next follow up: . Dr Daniel Bensimhon . Dr Dalton McLean . Amy Clegg, NP . Brittainy Simmons, PA . Lauren Kemp, PharmD   Please be sure to bring in all your medications bottles to every appointment.     

## 2020-07-02 ENCOUNTER — Ambulatory Visit (INDEPENDENT_AMBULATORY_CARE_PROVIDER_SITE_OTHER): Payer: Medicare Other | Admitting: *Deleted

## 2020-07-02 DIAGNOSIS — I428 Other cardiomyopathies: Secondary | ICD-10-CM

## 2020-07-02 LAB — CUP PACEART REMOTE DEVICE CHECK
Battery Remaining Longevity: 19 mo
Battery Remaining Percentage: 28 %
Battery Voltage: 2.87 V
Brady Statistic AP VP Percent: 27 %
Brady Statistic AP VS Percent: 1 %
Brady Statistic AS VP Percent: 73 %
Brady Statistic AS VS Percent: 1 %
Brady Statistic RA Percent Paced: 27 %
Date Time Interrogation Session: 20210714080323
HighPow Impedance: 75 Ohm
HighPow Impedance: 75 Ohm
Implantable Lead Implant Date: 20160801
Implantable Lead Implant Date: 20160801
Implantable Lead Implant Date: 20160801
Implantable Lead Location: 753858
Implantable Lead Location: 753859
Implantable Lead Location: 753860
Implantable Lead Model: 7122
Implantable Pulse Generator Implant Date: 20160801
Lead Channel Impedance Value: 1075 Ohm
Lead Channel Impedance Value: 390 Ohm
Lead Channel Impedance Value: 410 Ohm
Lead Channel Pacing Threshold Amplitude: 0.875 V
Lead Channel Pacing Threshold Amplitude: 1.25 V
Lead Channel Pacing Threshold Amplitude: 2 V
Lead Channel Pacing Threshold Pulse Width: 0.5 ms
Lead Channel Pacing Threshold Pulse Width: 0.5 ms
Lead Channel Pacing Threshold Pulse Width: 0.5 ms
Lead Channel Sensing Intrinsic Amplitude: 11.7 mV
Lead Channel Sensing Intrinsic Amplitude: 4 mV
Lead Channel Setting Pacing Amplitude: 1.875
Lead Channel Setting Pacing Amplitude: 2.5 V
Lead Channel Setting Pacing Amplitude: 2.5 V
Lead Channel Setting Pacing Pulse Width: 0.5 ms
Lead Channel Setting Pacing Pulse Width: 0.5 ms
Lead Channel Setting Sensing Sensitivity: 0.5 mV
Pulse Gen Serial Number: 7290135

## 2020-07-03 NOTE — Progress Notes (Signed)
Remote ICD transmission.   

## 2020-09-04 ENCOUNTER — Other Ambulatory Visit (HOSPITAL_COMMUNITY): Payer: Self-pay | Admitting: Cardiology

## 2020-09-15 ENCOUNTER — Other Ambulatory Visit: Payer: Self-pay

## 2020-09-15 ENCOUNTER — Ambulatory Visit (INDEPENDENT_AMBULATORY_CARE_PROVIDER_SITE_OTHER): Payer: Medicare Other | Admitting: Nurse Practitioner

## 2020-09-15 ENCOUNTER — Encounter: Payer: Self-pay | Admitting: Nurse Practitioner

## 2020-09-15 VITALS — BP 110/90 | HR 71 | Temp 97.4°F | Ht 72.0 in | Wt 222.8 lb

## 2020-09-15 DIAGNOSIS — I10 Essential (primary) hypertension: Secondary | ICD-10-CM

## 2020-09-15 DIAGNOSIS — I5022 Chronic systolic (congestive) heart failure: Secondary | ICD-10-CM

## 2020-09-15 DIAGNOSIS — Z95810 Presence of automatic (implantable) cardiac defibrillator: Secondary | ICD-10-CM

## 2020-09-15 DIAGNOSIS — Z1211 Encounter for screening for malignant neoplasm of colon: Secondary | ICD-10-CM

## 2020-09-15 DIAGNOSIS — I428 Other cardiomyopathies: Secondary | ICD-10-CM | POA: Diagnosis not present

## 2020-09-15 DIAGNOSIS — I447 Left bundle-branch block, unspecified: Secondary | ICD-10-CM

## 2020-09-15 DIAGNOSIS — Z1212 Encounter for screening for malignant neoplasm of rectum: Secondary | ICD-10-CM

## 2020-09-15 DIAGNOSIS — Z23 Encounter for immunization: Secondary | ICD-10-CM | POA: Diagnosis not present

## 2020-09-15 DIAGNOSIS — H9193 Unspecified hearing loss, bilateral: Secondary | ICD-10-CM

## 2020-09-15 LAB — CBC WITH DIFFERENTIAL/PLATELET
Absolute Monocytes: 780 cells/uL (ref 200–950)
Basophils Absolute: 58 cells/uL (ref 0–200)
Basophils Relative: 0.7 %
Eosinophils Absolute: 241 cells/uL (ref 15–500)
Eosinophils Relative: 2.9 %
HCT: 49.2 % (ref 38.5–50.0)
Hemoglobin: 16.8 g/dL (ref 13.2–17.1)
Lymphs Abs: 2233 cells/uL (ref 850–3900)
MCH: 31 pg (ref 27.0–33.0)
MCHC: 34.1 g/dL (ref 32.0–36.0)
MCV: 90.8 fL (ref 80.0–100.0)
MPV: 9.9 fL (ref 7.5–12.5)
Monocytes Relative: 9.4 %
Neutro Abs: 4988 cells/uL (ref 1500–7800)
Neutrophils Relative %: 60.1 %
Platelets: 367 10*3/uL (ref 140–400)
RBC: 5.42 10*6/uL (ref 4.20–5.80)
RDW: 12.4 % (ref 11.0–15.0)
Total Lymphocyte: 26.9 %
WBC: 8.3 10*3/uL (ref 3.8–10.8)

## 2020-09-15 LAB — LIPID PANEL
Cholesterol: 155 mg/dL (ref <200)
HDL: 46 mg/dL (ref >=40)
LDL Cholesterol (Calc): 86 mg/dL (calc)
Non-HDL Cholesterol (Calc): 109 mg/dL (calc) (ref <130)
Total CHOL/HDL Ratio: 3.4 (calc) (ref <5.0)
Triglycerides: 133 mg/dL (ref <150)

## 2020-09-15 LAB — COMPLETE METABOLIC PANEL WITH GFR
AG Ratio: 1.8 (calc) (ref 1.0–2.5)
ALT: 31 U/L (ref 9–46)
AST: 22 U/L (ref 10–35)
Albumin: 4.8 g/dL (ref 3.6–5.1)
Alkaline phosphatase (APISO): 62 U/L (ref 35–144)
BUN: 15 mg/dL (ref 7–25)
CO2: 24 mmol/L (ref 20–32)
Calcium: 10.5 mg/dL — ABNORMAL HIGH (ref 8.6–10.3)
Chloride: 104 mmol/L (ref 98–110)
Creat: 1.14 mg/dL (ref 0.70–1.25)
GFR, Est African American: 77 mL/min/{1.73_m2} (ref >=60)
GFR, Est Non African American: 66 mL/min/{1.73_m2} (ref >=60)
Globulin: 2.7 g/dL (calc) (ref 1.9–3.7)
Glucose, Bld: 98 mg/dL (ref 65–99)
Potassium: 4.8 mmol/L (ref 3.5–5.3)
Sodium: 140 mmol/L (ref 135–146)
Total Bilirubin: 0.7 mg/dL (ref 0.2–1.2)
Total Protein: 7.5 g/dL (ref 6.1–8.1)

## 2020-09-15 NOTE — Patient Instructions (Addendum)
To schedule AWV in 1 month  Routine follow up in 6 months.   To get shingrix vaccine at pharmacy

## 2020-09-15 NOTE — Progress Notes (Signed)
Careteam: Patient Care Team: Hulan Fess, MD as PCP - General (Family Medicine) Jacolyn Reedy, MD as Consulting Physician (Cardiology) Larey Dresser, MD as Consulting Physician (Cardiology) Deboraha Sprang, MD as Consulting Physician (Cardiology)  PLACE OF SERVICE:  [place] Directive information Does Patient Have a Medical Advance Directive?: No, Would patient like information on creating a medical advance directive?: Yes (MAU/Ambulatory/Procedural Areas - Information given)  No Known Allergies  Chief Complaint  Patient presents with  . Establish Care    New patient establich care. Patient has no concerns at this visit.  [place] [facility] Best Practice Recommendations    Tetanus/Tdap, Pneumonia  . Quality Metric Gaps    Colonoscopy     HPI: Patient is a 69 y.o. male to establish care.  Wanted to bring his primary care into the [facility].  Looking for geriatric based primary care.   Last physical/AWV 2016  CHF/HTN/cardiomypathy- on carvedilol 12.5 mg twice daily, lasix 20 mg daily as needed (will monitor weight and take as needed for 2-3 lbs weight gain), entrestro 49-51 mg twice daily with spironolactone 25 mg daily   S/p Pacemaker- following with cardiology, feels like he will need a battery exchange early next year. Was placed august 2016  Seasonal allergies- Claritin 10 mg PRN, just stopped taking daily last week.   MVI for supplement.   Last colonoscopy 2010, saw several doctors, unsure who, retired?  Member of gym, goes 5-6 times a week  Cardiovascular activity Bike, rowing, elliptical, walking  Hearing loss- worked in [employer], educational with loud noises and no ear protection   Got flu shot 2 weeks ago  No records of Pnemonia vaccine   Dietary modifications  Red meat once a week, need to improve on fruits and vegetables.  Good sleep.     Review of Systems:  Review of Systems  Constitutional: Negative for chills, fever and weight loss.    HENT: Positive for hearing loss.   Respiratory: Negative for cough, sputum production and shortness of breath.   Cardiovascular: Negative for chest pain, palpitations and leg swelling.  Gastrointestinal: Negative for abdominal pain, constipation, diarrhea and heartburn.  Genitourinary: Negative for dysuria, frequency and urgency.  Musculoskeletal: Negative for back pain, falls, joint pain and myalgias.  Skin: Negative.   Neurological: Negative for dizziness and headaches.  Endo/Heme/Allergies: Positive for environmental allergies.  Psychiatric/Behavioral: Negative for depression and memory loss. The patient does not have insomnia.     Past Medical History:  Diagnosis Date  . Cardiomyopathy ([place]) 04/07/2015  . Chronic systolic CHF (congestive heart failure) ([place]) 04/14/2015   a. s/p STJ CRTD b. EF normalized with CRT therapy  . GERD (gastroesophageal reflux disease)   . Hx of colonoscopy    Per [place] new patient packet  . Hypertension   . LBBB (left bundle branch block)   . Syncope 05/30/2015   Past Surgical History:  Procedure Laterality Date  . CARDIAC CATHETERIZATION    . COLONOSCOPY     Per [place] new patient packet  . EP IMPLANTABLE DEVICE N/A 07/21/2015   STJ CRTD implanted by Dr Caryl Comes for NICM, CHF  . FRACTURE SURGERY    . LEFT AND RIGHT HEART CATHETERIZATION WITH CORONARY ANGIOGRAM N/A 04/09/2015   Procedure: LEFT AND RIGHT HEART CATHETERIZATION WITH CORONARY ANGIOGRAM;  Surgeon: Jacolyn Reedy, MD;  Location: [facility] CATH LAB;  Service: Cardiovascular;  Laterality: N/A;  . ORIF ELBOW FRACTURE Left 1981  . SHOULDER SURGERY Left 05/2008   "  had to put cadavear bone in and reattach muscle to it"  . TONSILLECTOMY AND ADENOIDECTOMY  1960's   Social History:   reports that he has quit smoking. His smoking use included cigarettes. He has a 2.00 pack-year smoking history. He has never used smokeless tobacco. He reports current alcohol use of about 14.0 standard drinks of alcohol per week.  He reports that he does not use drugs.  Family History  Problem Relation Age of Onset  . Bone cancer Father   . Diabetes Father   . CVA Mother   . High blood pressure Sister     Medications: Patient's Medications  New Prescriptions   No medications on file  Previous Medications   CARVEDILOL (COREG) 12.5 MG TABLET    TAKE 1 TABLET (12.5 MG TOTAL) BY MOUTH 2 (TWO) TIMES DAILY WITH A MEAL.   FUROSEMIDE (LASIX) 20 MG TABLET    TAKE 1 TABLET BY MOUTH EVERY DAY AS NEEDED   LORATADINE (CLARITIN) 10 MG TABLET    Take 10 mg by mouth daily. As needed   MULTIPLE VITAMIN (MULTIVITAMIN) TABLET    Take 1 tablet by mouth daily.   SACUBITRIL-VALSARTAN (ENTRESTO) 49-51 MG    Take 1 tablet by mouth 2 (two) times daily.   SPIRONOLACTONE (ALDACTONE) 25 MG TABLET    TAKE 1 TABLET BY MOUTH EVERY DAY  Modified Medications   No medications on file  Discontinued Medications   No medications on file    Physical Exam:  Vitals:   09/15/20 0855  BP: 110/90  Pulse: 71  Temp: (!) 97.4 F (36.3 C)  TempSrc: Temporal  SpO2: 98%  Weight: 222 lb 12.8 oz (101.1 kg)  Height: 6' (1.829 m)   Body mass index is 30.22 kg/m. Wt Readings from Last 3 Encounters:  09/15/20 222 lb 12.8 oz (101.1 kg)  03/25/20 226 lb 6.4 oz (102.7 kg)  10/04/19 220 lb (99.8 kg)    Physical Exam Constitutional:      General: He is not in acute distress.    Appearance: He is well-developed. He is not diaphoretic.  HENT:     Head: Normocephalic and atraumatic.     Mouth/Throat:     Pharynx: No oropharyngeal exudate.  Eyes:     Conjunctiva/sclera: Conjunctivae normal.     Pupils: Pupils are equal, round, and reactive to light.  Cardiovascular:     Rate and Rhythm: Normal rate and regular rhythm.     Heart sounds: Normal heart sounds.  Pulmonary:     Effort: Pulmonary effort is normal.     Breath sounds: Normal breath sounds.  Abdominal:     General: Bowel sounds are normal.     Palpations: Abdomen is soft.    Musculoskeletal:        General: No tenderness.     Cervical back: Normal range of motion and neck supple.  Skin:    General: Skin is warm and dry.  Neurological:     Mental Status: He is alert and oriented to person, place, and time.    Labs reviewed: Basic Metabolic Panel: Recent Labs    09/24/19 1300 12/25/19 0951 03/25/20 1010  NA 139 137 139  K 3.9 4.6 4.0  CL 105 103 107  CO2 22 23 22   GLUCOSE 138* 106* 112*  BUN 18 16 18   CREATININE 1.20 1.12 1.10  CALCIUM 9.5 9.6 9.2   Liver Function Tests: No results for input(s): AST, ALT, ALKPHOS, BILITOT, PROT, ALBUMIN in the last 8760  hours. No results for input(s): LIPASE, AMYLASE in the last 8760 hours. No results for input(s): AMMONIA in the last 8760 hours. CBC: No results for input(s): WBC, NEUTROABS, HGB, HCT, MCV, PLT in the last 8760 hours. Lipid Panel: Recent Labs    09/24/19 1300  CHOL 165  HDL 55  LDLCALC 90  TRIG 102  CHOLHDL 3.0   TSH: No results for input(s): TSH in the last 8760 hours. A1C: No results found for: HGBA1C   Assessment/Plan 1. Screening for colorectal cancer -due for screening. Unsure who did last screening but previous doctor retired - Ambulatory referral to Gastroenterology  2. LBBB (left bundle branch block) Noted to hx, followed by cardiology  3. NICM (nonischemic cardiomyopathy) (HCC) -symptoms stable on coreg, entresto, spironolactone and lasix PRN  4. Chronic systolic CHF (congestive heart failure) (HCC) -euvolemic at this time. Continue current regimen and followed by cardiology   5. AICD (automatic cardioverter/defibrillator) present -monitored by cardiology  6. Essential hypertension -well controlled on current regimen with lifestyle modifications and medication management.  - CBC with Differential/Platelet - COMPLETE METABOLIC PANEL WITH GFR - Lipid panel  7. Bilateral hearing loss, unspecified hearing loss type - Ambulatory referral to Audiology  8. Need  for vaccination against Streptococcus pneumonia  - Pneumococcal polysaccharide vaccine 23-valent greater than or equal to 2yo subcutaneous/IM  Next appt: 1 month for AWV, 6 month for routine follow up [place]. [place], [facility] [phone]

## 2020-09-21 ENCOUNTER — Encounter: Payer: Self-pay | Admitting: Nurse Practitioner

## 2020-10-01 ENCOUNTER — Ambulatory Visit (INDEPENDENT_AMBULATORY_CARE_PROVIDER_SITE_OTHER): Payer: Medicare Other

## 2020-10-01 DIAGNOSIS — I428 Other cardiomyopathies: Secondary | ICD-10-CM

## 2020-10-03 ENCOUNTER — Other Ambulatory Visit (HOSPITAL_COMMUNITY): Payer: Self-pay

## 2020-10-03 LAB — CUP PACEART REMOTE DEVICE CHECK
Battery Remaining Longevity: 20 mo
Battery Remaining Percentage: 28 %
Battery Voltage: 2.86 V
Brady Statistic AP VP Percent: 30 %
Brady Statistic AP VS Percent: 1 %
Brady Statistic AS VP Percent: 70 %
Brady Statistic AS VS Percent: 1 %
Brady Statistic RA Percent Paced: 30 %
Date Time Interrogation Session: 20211013074430
HighPow Impedance: 82 Ohm
HighPow Impedance: 82 Ohm
Implantable Lead Implant Date: 20160801
Implantable Lead Implant Date: 20160801
Implantable Lead Implant Date: 20160801
Implantable Lead Location: 753858
Implantable Lead Location: 753859
Implantable Lead Location: 753860
Implantable Lead Model: 7122
Implantable Pulse Generator Implant Date: 20160801
Lead Channel Impedance Value: 1125 Ohm
Lead Channel Impedance Value: 380 Ohm
Lead Channel Impedance Value: 410 Ohm
Lead Channel Pacing Threshold Amplitude: 0.875 V
Lead Channel Pacing Threshold Amplitude: 1.25 V
Lead Channel Pacing Threshold Amplitude: 2 V
Lead Channel Pacing Threshold Pulse Width: 0.5 ms
Lead Channel Pacing Threshold Pulse Width: 0.5 ms
Lead Channel Pacing Threshold Pulse Width: 0.5 ms
Lead Channel Sensing Intrinsic Amplitude: 11.7 mV
Lead Channel Sensing Intrinsic Amplitude: 3.6 mV
Lead Channel Setting Pacing Amplitude: 1.875
Lead Channel Setting Pacing Amplitude: 2.5 V
Lead Channel Setting Pacing Amplitude: 2.5 V
Lead Channel Setting Pacing Pulse Width: 0.5 ms
Lead Channel Setting Pacing Pulse Width: 0.5 ms
Lead Channel Setting Sensing Sensitivity: 0.5 mV
Pulse Gen Serial Number: 7290135

## 2020-10-03 MED ORDER — ENTRESTO 49-51 MG PO TABS: 1.0000 | ORAL_TABLET | Freq: Two times a day (BID) | ORAL | 6 refills | Status: DC

## 2020-10-03 NOTE — Progress Notes (Deleted)
Cardiology Office Note Date:  10/03/2020  Patient ID:  Isaiah Wu, Isaiah Wu 1951/07/28, MRN [id number] PCP:  Lauree Chandler, NP  Cardiologist:  Dr. [name] >> Dr. Aundra Dubin Electrophysiologist: Dr. Caryl Comes  ***refresh   Chief Complaint: *** annual visit  History of Present Illness: Isaiah Wu is a 69 y.o. male with history of NICM (unclear etiology, ?LBBB induced, c.MRI with uptate perhaps prior myocarditis), chronic CHF (systolic), HTN, LBBB, GERD, CRT-D  He comes in today to be seen for Dr. Caryl Comes, last seen by him Aug 2019, more recently for EP< saw A. Lynnell Jude, NP, doing well, intact device function, no changes were made.  He saw Dr. Aundra Dubin in April this year, doing well, good exertional capacity, >99% BP, no changes were made.  Last TTE noted LVEF 45-50%.  *** symptoms *** volume *** meds, CM *** labs, lipids... *** %BP *** + remotes  Device information STJCRTD implanted2016for NICM, LBBB History of appropriate therapy:No History of AAD therapy:No   Past Medical History:  Diagnosis Date  . Cardiomyopathy ([place]) 04/07/2015  . Chronic systolic CHF (congestive heart failure) ([place]) 04/14/2015   a. s/p STJ CRTD b. EF normalized with CRT therapy  . Hx of colonoscopy    Per [place] new patient packet  . Hypertension   . LBBB (left bundle branch block)   . Syncope 05/30/2015    Past Surgical History:  Procedure Laterality Date  . CARDIAC CATHETERIZATION    . COLONOSCOPY     Per [place] new patient packet  . EP IMPLANTABLE DEVICE N/A 07/21/2015   STJ CRTD implanted by Dr Caryl Comes for NICM, CHF  . LEFT AND RIGHT HEART CATHETERIZATION WITH CORONARY ANGIOGRAM N/A 04/09/2015   Procedure: LEFT AND RIGHT HEART CATHETERIZATION WITH CORONARY ANGIOGRAM;  Surgeon: Jacolyn Reedy, MD;  Location: [facility] CATH LAB;  Service: Cardiovascular;  Laterality: N/A;  . ORIF ELBOW FRACTURE Left 1981  . SHOULDER SURGERY Left 05/2008   "had to put cadavear bone in and reattach muscle to it"  .  TONSILLECTOMY AND ADENOIDECTOMY  1960's    Current Outpatient Medications  Medication Sig Dispense Refill  . carvedilol (COREG) 12.5 MG tablet TAKE 1 TABLET (12.5 MG TOTAL) BY MOUTH 2 (TWO) TIMES DAILY WITH A MEAL. 180 tablet 3  . furosemide (LASIX) 20 MG tablet TAKE 1 TABLET BY MOUTH EVERY DAY AS NEEDED 90 tablet 2  . loratadine (CLARITIN) 10 MG tablet Take 10 mg by mouth daily. As needed    . Multiple Vitamin (MULTIVITAMIN) tablet Take 1 tablet by mouth daily.    . sacubitril-valsartan (ENTRESTO) 49-51 MG Take 1 tablet by mouth 2 (two) times daily. 60 tablet 11  . spironolactone (ALDACTONE) 25 MG tablet TAKE 1 TABLET BY MOUTH EVERY DAY 90 tablet 3   No current facility-administered medications for this visit.    Allergies:   Patient has no known allergies.   Social History:  The patient  reports that he has quit smoking. His smoking use included cigarettes. He has a 2.00 pack-year smoking history. He has never used smokeless tobacco. He reports current alcohol use. He reports that he does not use drugs.   Family History:  The patient's family history includes Bone cancer in his father; CVA in his mother; Diabetes in his father; High blood pressure in his sister.  ROS:  Please see the history of present illness.    All other systems are reviewed and otherwise negative.   PHYSICAL EXAM:  VS:  There were no vitals  taken for this visit. BMI: There is no height or weight on file to calculate BMI. Well nourished, well developed, in no acute distress HEENT: normocephalic, atraumatic Neck: no JVD, carotid bruits or masses Cardiac:  *** RRR; no significant murmurs, no rubs, or gallops Lungs:  *** CTA b/l, no wheezing, rhonchi or rales Abd: soft, nontender MS: no deformity or *** atrophy Ext: *** no edema Skin: warm and dry, no rash Neuro:  No gross deficits appreciated Psych: euthymic mood, full affect  *** ICD site is stable, no tethering or discomfort   EKG:  Not done  today   Device interrogation done today and reviewed by myself:  ***  Echo (4/16) with EF 15%, severe diffuse hypokinesis [name]).   RHC/LHC (4/16) with normal coronaries; mean RA 10, PA 58/35 mean 48, mean PCWP 32, no comment on CO.  HIV negative, TSH normal, ferritin normal, SPEP normal. S/p St Jude CRT-D 07/21/15.  - cMRI  04/2015 with severely dilated LV, EF 14%, septal-lateral dyssynchrony (prominent), normal RV size with mild to moderately decreased systolic function, at least moderate functional mitral regurgitation, there was basal inferoseptal subepicardial LGE (small area) and apical septal subtle mid-wall LGE (small area).   - Echo (9/16) with EF 25-30%, mild MR => improved.  - CPX (1/17) with VE/VCO2 26.2, peak VO2 20.3, RER 1.13 => mild heart failure limitation.   - Echo (2/17) EF 40-45%.   - Echo (4/18) with EF 50-55% - Echo (10/18): EF 50-55%, mild RV dilation with normal systolic function.  - Echo (10/19): EF 45-50%, RV mildly dilated, normal systolic function.  - Echo (1/21): EF 45-50%, low normal RV function.    Recent Labs: 09/15/2020: ALT 31; BUN 15; Creat 1.14; Hemoglobin 16.8; Platelets 367; Potassium 4.8; Sodium 140  09/15/2020: Cholesterol 155; HDL 46; LDL Cholesterol (Calc) 86; Total CHOL/HDL Ratio 3.4; Triglycerides 133   CrCl cannot be calculated (Unknown ideal weight.).   Wt Readings from Last 3 Encounters:  09/15/20 222 lb 12.8 oz (101.1 kg)  03/25/20 226 lb 6.4 oz (102.7 kg)  10/04/19 220 lb (99.8 kg)     Other studies reviewed: Additional studies/records reviewed today include: summarized above  ASSESSMENT AND PLAN:  1. CRT-D     ***  2. NICM 3. Chronic CHF (systolic) with some recovery of LVEF     ***  4. HTN    ***    Disposition: F/u with ***  Current medicines are reviewed at length with the patient today.  The patient did not have any concerns regarding medicines.  Venetia Night, PA-C 10/03/2020 12:56 PM     [address] [place] [id number] [phone] (office)  [phone] (fax)

## 2020-10-06 NOTE — Progress Notes (Signed)
Remote ICD transmission.   

## 2020-10-07 ENCOUNTER — Encounter: Payer: Medicare Other | Admitting: Physician Assistant

## 2020-10-13 ENCOUNTER — Other Ambulatory Visit: Payer: Medicare Other

## 2020-10-13 ENCOUNTER — Other Ambulatory Visit: Payer: Self-pay

## 2020-10-13 ENCOUNTER — Other Ambulatory Visit (HOSPITAL_COMMUNITY): Payer: Self-pay

## 2020-10-13 LAB — BASIC METABOLIC PANEL WITH GFR
BUN: 16 mg/dL (ref 7–25)
CO2: 23 mmol/L (ref 20–32)
Calcium: 9.7 mg/dL (ref 8.6–10.3)
Chloride: 106 mmol/L (ref 98–110)
Creat: 1.16 mg/dL (ref 0.70–1.25)
GFR, Est African American: 75 mL/min/{1.73_m2} (ref >=60)
GFR, Est Non African American: 65 mL/min/{1.73_m2} (ref >=60)
Glucose, Bld: 100 mg/dL — ABNORMAL HIGH (ref 65–99)
Potassium: 4.1 mmol/L (ref 3.5–5.3)
Sodium: 138 mmol/L (ref 135–146)

## 2020-10-13 MED ORDER — ENTRESTO 49-51 MG PO TABS: 1.0000 | ORAL_TABLET | Freq: Two times a day (BID) | ORAL | 6 refills | Status: DC

## 2020-10-14 ENCOUNTER — Encounter (HOSPITAL_COMMUNITY): Payer: Self-pay | Admitting: Cardiology

## 2020-10-14 ENCOUNTER — Ambulatory Visit (HOSPITAL_COMMUNITY)
Admission: RE | Admit: 2020-10-14 | Discharge: 2020-10-14 | Disposition: A | Discharge status: Home / Self Care | Payer: Medicare Other | Source: Ambulatory Visit | Attending: Cardiology | Admitting: Cardiology

## 2020-10-14 VITALS — BP 124/88 | HR 70 | Wt 223.6 lb

## 2020-10-14 DIAGNOSIS — I447 Left bundle-branch block, unspecified: Secondary | ICD-10-CM | POA: Diagnosis not present

## 2020-10-14 DIAGNOSIS — Z8249 Family history of ischemic heart disease and other diseases of the circulatory system: Secondary | ICD-10-CM | POA: Insufficient documentation

## 2020-10-14 DIAGNOSIS — Z79899 Other long term (current) drug therapy: Secondary | ICD-10-CM | POA: Insufficient documentation

## 2020-10-14 DIAGNOSIS — I5022 Chronic systolic (congestive) heart failure: Secondary | ICD-10-CM | POA: Insufficient documentation

## 2020-10-14 DIAGNOSIS — Z87891 Personal history of nicotine dependence: Secondary | ICD-10-CM | POA: Diagnosis not present

## 2020-10-14 DIAGNOSIS — I110 Hypertensive heart disease with heart failure: Secondary | ICD-10-CM | POA: Diagnosis not present

## 2020-10-14 DIAGNOSIS — I428 Other cardiomyopathies: Secondary | ICD-10-CM | POA: Insufficient documentation

## 2020-10-14 NOTE — Progress Notes (Signed)
Patient ID: Isaiah Wu, male   DOB: [birth year], 69 y.o.   MRN: [id number] PCP: Dr. Rex Wu HF Cardiology: Dr. Aundra Wu  69 y.o. with minimal PMH diagnosed with systolic HF in 1/76.Echo showed EF 15% with diffuse hypokinesis.  Initially seen by Dr. Wynonia Wu. He was [facility] to [facility] in 4/16 for diuresis.  RHC/LHC showed elevated filling pressures and no significant coronary disease.  Underwent St Jude BiV-ICD on 07/21/15.  Repeat echo in 2/17 showed EF up to 40-45%.  Echo in 10/18 showed EF up to 50-55%.  Echo in 10/19 with EF 45-50%.  Echo in 1/21 was stable with EF 45-50%.    He returns for followup of CHF/dyspnea.  Weight down 3 lbs.  He uses Lasix rarely, usually after a meal out.  No exertional dyspnea.  No chest pain.  Goes to gym regularly.  No lightheadedness.   St Jude device interrogation: >99% BiV pacing, stable thoracic impedance, no arrhythmias.    Labs 4/16: TSH normal, SPEP negative, ferritin normal, HIV negative, K 4.6, creatinine 1.22, HCT 46.9 Labs 6/16: K 3.8, Creatinine 1.08 Labs 07/18/15: K 4.1, Creatinine 0.99 Labs 9/16: digoxin 0.3, K 4.3, creatinine 1.03 Labs 11/16: K 4.3, creatinine 1.09, digoxin 0.6, BNP 23 Labs 2/17: K 4.2, creatinine 1.13, BNP 15.9, digoxin 0.4 Labs 3/17: K 4.2, creatinine 1.09, LDL 65 Labs 6/17: digoxin 0.3 Labs 7/17: K 4.3, creatinine 1.19 Labs 8/17: digoxin 0.5, K 3.9, creatinine 1.15 Labs 4/18: LDL 71 Labs 7/18: K 4, creatinine 1.15 Labs 10/18: K 4, creatinine 1.03, TSH normal, hgb 15.9 Labs 7/19: LDL 98 Labs 10/19: K 4.1, creatinine 1.13  Labs 1/20: creatinine 0.98 Labs 10/20: LDL 90 Labs 1/21: K 4.6, creatinine 1.12 Labs 9/21: LDL 86 Labs 10/21: K 4.1, creatinine 1.16  PMH: 1. HTN 2. GERD 3. LBBB 4. Cardiomyopathy: Nonischemic.  Echo (4/16) with EF 15%, severe diffuse hypokinesis Isaiah Wu).  RHC/LHC (4/16) with normal coronaries; mean RA 10, PA 58/35 mean 48, mean PCWP 32, no comment on CO.  HIV negative, TSH normal, ferritin normal,  SPEP normal. S/p St Jude CRT-D 07/21/15.  - cMRI  04/2015 with severely dilated LV, EF 14%, septal-lateral dyssynchrony (prominent), normal RV size with mild to moderately decreased systolic function, at least moderate functional mitral regurgitation, there was basal inferoseptal subepicardial LGE (small area) and apical septal subtle mid-wall LGE (small area).   - Echo (9/16) with EF 25-30%, mild MR => improved.  - CPX (1/17) with VE/VCO2 26.2, peak VO2 20.3, RER 1.13 => mild heart failure limitation.   - Echo (2/17) EF 40-45%.   - Echo (4/18) with EF 50-55% - Echo (10/18): EF 50-55%, mild RV dilation with normal systolic function.  - Echo (10/19): EF 45-50%, RV mildly dilated, normal systolic function.  - Echo (1/21): EF 45-50%, low normal RV function.  5. Syncopal episode 6/16: Monitor ok. Suspect orthostasis.    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  No cardiomyopathy or sudden death.   ROS: All systems reviewed and negative except as per HPI.   Current Outpatient Medications  Medication Sig Dispense Refill  . carvedilol (COREG) 12.5 MG tablet TAKE 1 TABLET (12.5 MG TOTAL) BY MOUTH 2 (TWO) TIMES DAILY WITH A MEAL. 180 tablet 3  . furosemide (LASIX) 20 MG tablet TAKE 1 TABLET BY MOUTH EVERY DAY AS NEEDED 90 tablet 2  .  Multiple Vitamin (MULTIVITAMIN) tablet Take 1 tablet by mouth daily.    . sacubitril-valsartan (ENTRESTO) 49-51 MG Take 1 tablet by mouth 2 (two) times daily. 60 tablet 6  . spironolactone (ALDACTONE) 25 MG tablet TAKE 1 TABLET BY MOUTH EVERY DAY 90 tablet 3   No current facility-administered medications for this encounter.   BP 124/88   Pulse 70   Wt 101.4 kg (223 lb 9.6 oz)   SpO2 97%   BMI 30.33 kg/m  General: NAD Neck: No JVD, no thyromegaly or thyroid nodule.  Lungs: Clear to auscultation bilaterally with normal respiratory  effort. CV: Nondisplaced PMI.  Heart regular S1/S2, no S3/S4, no murmur.  No peripheral edema.  No carotid bruit.  Normal pedal pulses.  Abdomen: Soft, nontender, no hepatosplenomegaly, no distention.  Skin: Intact without lesions or rashes.  Neurologic: Alert and oriented x 3.  Psych: Normal affect. Extremities: No clubbing or cyanosis.  HEENT: Normal.   Assessment/Plan:  1. Chronic systolic CHF: Nonischemic cardiomyopathy, EF 15% initially, up to 45-50% on 1/21 echo with medical treatment and St Jude CRT-D. Etiology of cardiomyopathy is uncertain: normal coronaries, no symptoms suggestive of viral syndrome prior to admission, HIV/Ferritin/SPEP/TSH unremarkable.  No history of familial CMP.  He has LBBB of uncertain duration.  ? LBBB cardiomyopathy.  However, there was also mid-wall LGE in the septum on cardiac MRI, suggesting possibility of myocarditis.  CPX in 1/17 suggested only a mild heart failure limitation.  NYHA class II.  He is euvolemic on exam. - Continue spironolactone 25 mg daily, Entresto 49/51 bid.  BMET today.  - Continue Coreg 12.5 mg bid.     - Repeat echo at followup in 6 months.      2. LBBB: Of uncertain duration.   Followup in 6 months with echo.   Isaiah Wu 10/14/2020

## 2020-10-14 NOTE — Patient Instructions (Signed)
Your physician recommends that you return for lab work in: 3 months  Please call our office in March 2022 to schedule your follow up appointment and echocardiogram  If you have any questions or concerns before your next appointment please send [place] a message through [place] or call our office at [phone].    TO LEAVE A MESSAGE FOR THE NURSE SELECT OPTION 2, PLEASE LEAVE A MESSAGE INCLUDING: . YOUR NAME . DATE OF BIRTH . CALL BACK NUMBER . REASON FOR CALL**this is important as we prioritize the call backs  [place] AS LONG AS YOU CALL BEFORE 4:00 PM  At the [facility], you and your health needs are our priority. As part of our continuing mission to provide you with exceptional heart care, we have created designated Provider Care Teams. These Care Teams include your primary Cardiologist (physician) and Advanced Practice Providers (APPs- Physician Assistants and Nurse Practitioners) who all work together to provide you with the care you need, when you need it.   You may see any of the following providers on your designated Care Team at your next follow up: Marland Kitchen Dr Glori Bickers . Dr Loralie Champagne . Darrick Grinder, NP . Lyda Jester, PA . Audry Riles, PharmD   Please be sure to bring in all your medications bottles to every appointment.

## 2020-10-15 ENCOUNTER — Other Ambulatory Visit (HOSPITAL_COMMUNITY): Payer: Self-pay

## 2020-10-15 MED ORDER — ENTRESTO 49-51 MG PO TABS
1.0000 | ORAL_TABLET | Freq: Two times a day (BID) | ORAL | 6 refills | Status: DC
Start: 2020-10-15 — End: 2021-11-19

## 2020-10-15 MED ORDER — ENTRESTO 49-51 MG PO TABS: 1.0000 | ORAL_TABLET | Freq: Two times a day (BID) | ORAL | 6 refills | Status: DC

## 2020-10-16 ENCOUNTER — Encounter: Payer: Self-pay | Admitting: Nurse Practitioner

## 2020-10-16 ENCOUNTER — Other Ambulatory Visit: Payer: Self-pay

## 2020-10-16 ENCOUNTER — Telehealth: Payer: Self-pay

## 2020-10-16 ENCOUNTER — Ambulatory Visit (INDEPENDENT_AMBULATORY_CARE_PROVIDER_SITE_OTHER): Payer: Medicare Other | Admitting: Nurse Practitioner

## 2020-10-16 DIAGNOSIS — Z0000 Encounter for general adult medical examination without abnormal findings: Secondary | ICD-10-CM | POA: Diagnosis not present

## 2020-10-16 NOTE — Progress Notes (Signed)
Subjective:   Isaiah Wu is a 69 y.o. male who presents for Medicare Annual/Subsequent preventive examination.  Review of Systems     Cardiac Risk Factors include: advanced age (>69men, >16 women);hypertension;obesity (BMI >30kg/m2)     Objective:    There were no vitals filed for this visit. There is no height or weight on file to calculate BMI.  Advanced Directives 10/16/2020 09/15/2020 07/21/2015 07/21/2015 04/08/2015  Does Patient Have a Medical Advance Directive? No No Yes No No  Type of Advance Directive - - [place] - -  Does patient want to make changes to medical advance directive? - - No - Patient declined - -  Copy of [place] in Chart? - - No - copy requested - -  Would patient like information on creating a medical advance directive? - Yes (MAU/Ambulatory/Procedural Areas - Information given) - No - patient declined information Yes - Spiritual care consult ordered    Current Medications (verified) Outpatient Encounter Medications as of 10/16/2020  Medication Sig  . carvedilol (COREG) 12.5 MG tablet TAKE 1 TABLET (12.5 MG TOTAL) BY MOUTH 2 (TWO) TIMES DAILY WITH A MEAL.  . furosemide (LASIX) 20 MG tablet TAKE 1 TABLET BY MOUTH EVERY DAY AS NEEDED  . Multiple Vitamin (MULTIVITAMIN) tablet Take 1 tablet by mouth daily.  . sacubitril-valsartan (ENTRESTO) 49-51 MG Take 1 tablet by mouth 2 (two) times daily.  Marland Kitchen spironolactone (ALDACTONE) 25 MG tablet TAKE 1 TABLET BY MOUTH EVERY DAY   No facility-administered encounter medications on file as of 10/16/2020.    Allergies (verified) Patient has no known allergies.   History: Past Medical History:  Diagnosis Date  . Cardiomyopathy ([place]) 04/07/2015  . Chronic systolic CHF (congestive heart failure) ([place]) 04/14/2015   a. s/p STJ CRTD b. EF normalized with CRT therapy  . Hx of colonoscopy    Per [place] new patient packet  . Hypertension   . LBBB (left bundle branch block)   .  Syncope 05/30/2015   Past Surgical History:  Procedure Laterality Date  . CARDIAC CATHETERIZATION    . COLONOSCOPY     Per [place] new patient packet  . EP IMPLANTABLE DEVICE N/A 07/21/2015   STJ CRTD implanted by Dr Caryl Comes for NICM, CHF  . LEFT AND RIGHT HEART CATHETERIZATION WITH CORONARY ANGIOGRAM N/A 04/09/2015   Procedure: LEFT AND RIGHT HEART CATHETERIZATION WITH CORONARY ANGIOGRAM;  Surgeon: Jacolyn Reedy, MD;  Location[facility] CATH LAB;  Service: Cardiovascular;  Laterality: N/A;  . ORIF ELBOW FRACTURE Left 1981  . SHOULDER SURGERY Left 05/2008   "had to put cadavear bone in and reattach muscle to it"  . TONSILLECTOMY AND ADENOIDECTOMY  1960's   Family History  Problem Relation Age of Onset  . Bone cancer Father   . Diabetes Father   . CVA Mother   . High blood pressure Sister    Social History   Socioeconomic History  . Marital status: Married    Spouse name: Not on file  . Number of children: Not on file  . Years of education: Not on file  . Highest education level: Not on file  Occupational History  . Not on file  Tobacco Use  . Smoking status: Former Smoker    Packs/day: 1.00    Years: 2.00    Pack years: 2.00    Types: Cigarettes  . Smokeless tobacco: Never Used  . Tobacco comment: 'quit smoking in the 1970's"  Substance and Sexual Activity  .  Alcohol use: Yes    Alcohol/week: 0.0 standard drinks  . Drug use: No  . Sexual activity: Yes  Other Topics Concern  . Not on file  Social History Narrative   Diet No      Do you drink/eat things with caffeine Yes, 2 cups of coffee daily      Marital Status Yes What year were you married? 1976      Do you live in a house, apartment, assisted living, condo, trailer, etc.? House      Is it one or more stories? Yes      How many persons live in your home? 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 No      Do you have a DNR form?  No       If not, would you like to discuss one? Yes      Do you have signed POA/HPOA forms? No      Do you have difficulty bathing or dressing yourself? No      Do you have difficulty preparing food or eating? No      Do you have difficulty managing medications? No      Do you have difficulty managing your finances? No      Do you have difficulty affording your medications? No                  Social Determinants of Health   Financial Resource Strain:   . Difficulty of Paying Living Expenses: Not on file  Food Insecurity:   . Worried About Charity fundraiser in the Last Year: Not on file  . Ran Out of Food in the Last Year: Not on file  Transportation Needs:   . Lack of Transportation (Medical): Not on file  . Lack of Transportation (Non-Medical): Not on file  Physical Activity:   . Days of Exercise per Week: Not on file  . Minutes of Exercise per Session: Not on file  Stress:   . Feeling of Stress : Not on file  Social Connections:   . Frequency of Communication with Friends and Family: Not on file  . Frequency of Social Gatherings with Friends and Family: Not on file  . Attends Religious Services: Not on file  . Active Member of Clubs or Organizations: Not on file  . Attends Archivist Meetings: Not on file  . Marital Status: Not on file    Tobacco Counseling Counseling given: Not Answered Comment: 'quit smoking in the 1970's"   Clinical Intake:  Pre-visit preparation completed: Yes  Pain : No/denies pain     BMI - recorded: 30 Nutritional Status: BMI > 30  Obese Diabetes: No  How often do you need to have someone help you when you read instructions, pamphlets, or other written materials from your doctor or pharmacy?: 1 - Never  Diabetic?no           Activities of Daily Living In your present state of health, do you have any difficulty performing the following activities: 10/16/2020  Hearing? Y  Vision? Y  Comment needs cataracts  Difficulty concentrating or making decisions? N  Walking or climbing stairs? N  Dressing or bathing? N  Doing errands, shopping? N  Preparing Food and eating ? N  Using the Toilet? N  In the past six months, have you accidently leaked urine? N  Do you have problems with loss of bowel control? N  Managing your Medications? N  Managing your Finances? N  Housekeeping or managing your Housekeeping? N  Some recent data might be hidden    Patient Care Team: Lauree Chandler, NP as PCP - General (Geriatric Medicine) Jacolyn Reedy, MD as Consulting Physician (Cardiology) Larey Dresser, MD as Consulting Physician (Cardiology) Deboraha Sprang, MD as Consulting Physician (Cardiology)  Indicate any recent Medical Services you may have received from other than [facility] providers in the past year (date may be approximate).     Assessment:   This is a routine wellness examination for Emilian.  Hearing/Vision screen  Hearing Screening   125Hz  250Hz  500Hz  1000Hz  2000Hz  3000Hz  4000Hz  6000Hz  8000Hz   Right ear:           Left ear:           Comments: Patient states he does not have problems with hearing, but needs it checked  Vision Screening Comments: Patient had eye exam 3 months ago and has cataracts.  Dietary issues and exercise activities discussed: Current Exercise Habits: Structured exercise class, Type of exercise: walking;calisthenics, Time (Minutes): 40, Frequency (Times/Week): 5, Weekly Exercise (Minutes/Week): 200  Goals    . Weight (lb) < 210 lb (95.3 kg)     Weight loss with diet and exercise.       Depression Screen PHQ 2/9 Scores 10/16/2020 09/15/2020  PHQ - 2 Score 0 0    Fall Risk Fall Risk  10/16/2020 09/15/2020  Falls in the past year? 0 0  Number falls in past yr: 0 0   Injury with Fall? 0 0    Any stairs in or around the home? Yes  If so, are there any without handrails? Yes  Home free of loose throw rugs in walkways, pet beds, electrical cords, etc? No  Adequate lighting in your home to reduce risk of falls? Yes   ASSISTIVE DEVICES UTILIZED TO PREVENT FALLS:  Life alert? Yes  Use of a cane, walker or w/c? No  Grab bars in the bathroom? Yes  Shower chair or bench in shower? No  Elevated toilet seat or a handicapped toilet? Yes   TIMED UP AND GO:  Was the test performed? No .   Cognitive Function:     6CIT Screen 10/16/2020  What Year? 0 points  What month? 0 points  What time? 0 points  Count back from 20 0 points  Months in reverse 0 points  Repeat phrase 0 points  Total Score 0    Immunizations Immunization History  Administered Date(s) Administered  . Hepatitis B, adult 03/06/2015  . Influenza, High Dose Seasonal PF 10/09/2018, 09/28/2019, 09/01/2020  . Influenza,inj,Quad PF,6+ Mos 11/15/2017  . PFIZER SARS-COV-2 Vaccination 02/02/2020, 02/24/2020  . Pneumococcal Polysaccharide-23 09/15/2020    TDAP status: Due, Education has been provided regarding the importance of this vaccine. Advised may receive this vaccine at local pharmacy or Health Dept. Aware to provide a copy of the vaccination record if obtained from local pharmacy or Health Dept. Verbalized acceptance and understanding. Flu Vaccine status: Up to date Pneumococcal vaccine status: Up to date Covid-19 vaccine status: Completed vaccines  Qualifies for Shingles Vaccine? Yes  Zostavax completed No   Shingrix Completed?: Yes  Screening Tests Health Maintenance  Topic Date Due  . TETANUS/TDAP  Never done  . COLONOSCOPY  Never done  . PNA vac Low Risk Adult (2 of 2 - PCV13) 09/15/2021  . INFLUENZA VACCINE  Completed  . COVID-19 Vaccine  Completed  . Hepatitis C Screening  Completed    Health Maintenance  Health Maintenance Due  Topic Date Due  .  TETANUS/TDAP  Never done  . COLONOSCOPY  Never done    Colorectal cancer screening: Referral to GI placed 09/15/2020. Pt aware the office will call re: appt.  Lung Cancer Screening: (Low Dose CT Chest recommended if Age 63-80 years, 30 pack-year currently smoking OR have quit w/in 15years.) does not qualify.   Lung Cancer Screening Referral: na  Additional Screening:  Hepatitis C Screening: does qualify; Completed 04/09/2015  Vision Screening: Recommended annual ophthalmology exams for early detection of glaucoma and other disorders of the eye. Is the patient up to date with their annual eye exam?  Yes  Who is the provider or what is the name of the office in which the patient attends annual eye exams? [facility] [place] If pt is not established with a provider, would they like to be referred to a provider to establish care? No .   Dental Screening: Recommended annual dental exams for proper oral hygiene  Community Resource Referral / Chronic Care Management: CRR required this visit?  No   CCM required this visit?  No      Plan:     I have personally reviewed and noted the following in the patient's chart:   . Medical and social history . Use of alcohol, tobacco or illicit drugs  . Current medications and supplements . Functional ability and status . Nutritional status . Physical activity . Advanced directives . List of other physicians . Hospitalizations, surgeries, and ER visits in previous 12 months . Vitals . Screenings to include cognitive, depression, and falls . Referrals and appointments  In addition, I have reviewed and discussed with patient certain preventive protocols, quality metrics, and best practice recommendations. A written personalized care plan for preventive services as well as general preventive health recommendations were provided to patient.     Lauree Chandler, NP   10/16/2020    Virtual Visit via Telephone Note  I connected with@ on  10/16/20 at  8:30 AM EDT by telephone and verified that I am speaking with the correct person using two identifiers.  Location: Patient: parking lot at [facility] Provider: [place].    I discussed the limitations, risks, security and privacy concerns of performing an evaluation and management service by telephone and the availability of in person appointments. I also discussed with the patient that there may be a patient responsible charge related to this service. The patient expressed understanding and agreed to proceed.   I discussed the assessment and treatment plan with the patient. The patient was provided an opportunity to ask questions and all were answered. The patient agreed with the plan and demonstrated an understanding of the instructions.   The patient was advised to call back or seek an in-person evaluation if the symptoms worsen or if the condition fails to improve as anticipated.  I provided 16 minutes of non-face-to-face time during this encounter.  Carlos American. Harle Battiest Avs printed and mailed

## 2020-10-16 NOTE — Patient Instructions (Signed)
Mr. Isaiah Wu , Thank you for taking time to come for your Medicare Wellness Visit. I appreciate your ongoing commitment to your health goals. Please review the following plan we discussed and let me know if I can assist you in the future.   Screening recommendations/referrals: Colonoscopy DUE referral placed.  Recommended yearly ophthalmology/optometry visit for glaucoma screening and checkup Recommended yearly dental visit for hygiene and checkup  Vaccinations: Influenza vaccine up to date Pneumococcal vaccine Due for Prevnar 13 in 1 year Tdap vaccine RECOMMENDED, to get at local pharmacy Shingles vaccine RECOMMENDED, to get at local pharmacy    Advanced directives: recommended to complete and bring to office so we can place on file.   Conditions/risks identified: advanced age, obesity, need for colorectal screening.   Next appointment: 1 year   Preventive Care 8 Years and Older, Male Preventive care refers to lifestyle choices and visits with your health care provider that can promote health and wellness. What does preventive care include?  A yearly physical exam. This is also called an annual well check.  Dental exams once or twice a year.  Routine eye exams. Ask your health care provider how often you should have your eyes checked.  Personal lifestyle choices, including:  Daily care of your teeth and gums.  Regular physical activity.  Eating a healthy diet.  Avoiding tobacco and drug use.  Limiting alcohol use.  Practicing safe sex.  Taking low doses of aspirin every day.  Taking vitamin and mineral supplements as recommended by your health care provider. What happens during an annual well check? The services and screenings done by your health care provider during your annual well check will depend on your age, overall health, lifestyle risk factors, and family history of disease. Counseling  Your health care provider may ask you questions about your:  Alcohol  use.  Tobacco use.  Drug use.  Emotional well-being.  Home and relationship well-being.  Sexual activity.  Eating habits.  History of falls.  Memory and ability to understand (cognition).  Work and work Statistician. Screening  You may have the following tests or measurements:  Height, weight, and BMI.  Blood pressure.  Lipid and cholesterol levels. These may be checked every 5 years, or more frequently if you are over 21 years old.  Skin check.  Lung cancer screening. You may have this screening every year starting at age 47 if you have a 30-pack-year history of smoking and currently smoke or have quit within the past 15 years.  Fecal occult blood test (FOBT) of the stool. You may have this test every year starting at age 67.  Flexible sigmoidoscopy or colonoscopy. You may have a sigmoidoscopy every 5 years or a colonoscopy every 10 years starting at age 38.  Prostate cancer screening. Recommendations will vary depending on your family history and other risks.  Hepatitis C blood test.  Hepatitis B blood test.  Sexually transmitted disease (STD) testing.  Diabetes screening. This is done by checking your blood sugar (glucose) after you have not eaten for a while (fasting). You may have this done every 1-3 years.  Abdominal aortic aneurysm (AAA) screening. You may need this if you are a current or former smoker.  Osteoporosis. You may be screened starting at age 24 if you are at high risk. Talk with your health care provider about your test results, treatment options, and if necessary, the need for more tests. Vaccines  Your health care provider may recommend certain vaccines, such as:  Influenza vaccine. This is recommended every year.  Tetanus, diphtheria, and acellular pertussis (Tdap, Td) vaccine. You may need a Td booster every 10 years.  Zoster vaccine. You may need this after age 19.  Pneumococcal 13-valent conjugate (PCV13) vaccine. One dose is  recommended after age 32.  Pneumococcal polysaccharide (PPSV23) vaccine. One dose is recommended after age 51. Talk to your health care provider about which screenings and vaccines you need and how often you need them. This information is not intended to replace advice given to you by your health care provider. Make sure you discuss any questions you have with your health care provider. Document Released: 01/02/2016 Document Revised: 08/25/2016 Document Reviewed: 10/07/2015 Elsevier Interactive Patient Education  2017 [place] Prevention in the Home Falls can cause injuries. They can happen to people of all ages. There are many things you can do to make your home safe and to help prevent falls. What can I do on the outside of my home?  Regularly fix the edges of walkways and driveways and fix any cracks.  Remove anything that might make you trip as you walk through a door, such as a raised step or threshold.  Trim any bushes or trees on the path to your home.  Use bright outdoor lighting.  Clear any walking paths of anything that might make someone trip, such as rocks or tools.  Regularly check to see if handrails are loose or broken. Make sure that both sides of any steps have handrails.  Any raised decks and porches should have guardrails on the edges.  Have any leaves, snow, or ice cleared regularly.  Use sand or salt on walking paths during winter.  Clean up any spills in your garage right away. This includes oil or grease spills. What can I do in the bathroom?  Use night lights.  Install grab bars by the toilet and in the tub and shower. Do not use towel bars as grab bars.  Use non-skid mats or decals in the tub or shower.  If you need to sit down in the shower, use a plastic, non-slip stool.  Keep the floor dry. Clean up any water that spills on the floor as soon as it happens.  Remove soap buildup in the tub or shower regularly.  Attach bath mats  securely with double-sided non-slip rug tape.  Do not have throw rugs and other things on the floor that can make you trip. What can I do in the bedroom?  Use night lights.  Make sure that you have a light by your bed that is easy to reach.  Do not use any sheets or blankets that are too big for your bed. They should not hang down onto the floor.  Have a firm chair that has side arms. You can use this for support while you get dressed.  Do not have throw rugs and other things on the floor that can make you trip. What can I do in the kitchen?  Clean up any spills right away.  Avoid walking on wet floors.  Keep items that you use a lot in easy-to-reach places.  If you need to reach something above you, use a strong step stool that has a grab bar.  Keep electrical cords out of the way.  Do not use floor polish or wax that makes floors slippery. If you must use wax, use non-skid floor wax.  Do not have throw rugs and other things on the floor that  can make you trip. What can I do with my stairs?  Do not leave any items on the stairs.  Make sure that there are handrails on both sides of the stairs and use them. Fix handrails that are broken or loose. Make sure that handrails are as long as the stairways.  Check any carpeting to make sure that it is firmly attached to the stairs. Fix any carpet that is loose or worn.  Avoid having throw rugs at the top or bottom of the stairs. If you do have throw rugs, attach them to the floor with carpet tape.  Make sure that you have a light switch at the top of the stairs and the bottom of the stairs. If you do not have them, ask someone to add them for you. What else can I do to help prevent falls?  Wear shoes that:  Do not have high heels.  Have rubber bottoms.  Are comfortable and fit you well.  Are closed at the toe. Do not wear sandals.  If you use a stepladder:  Make sure that it is fully opened. Do not climb a closed  stepladder.  Make sure that both sides of the stepladder are locked into place.  Ask someone to hold it for you, if possible.  Clearly mark and make sure that you can see:  Any grab bars or handrails.  First and last steps.  Where the edge of each step is.  Use tools that help you move around (mobility aids) if they are needed. These include:  Canes.  Walkers.  Scooters.  Crutches.  Turn on the lights when you go into a dark area. Replace any light bulbs as soon as they burn out.  Set up your furniture so you have a clear path. Avoid moving your furniture around.  If any of your floors are uneven, fix them.  If there are any pets around you, be aware of where they are.  Review your medicines with your doctor. Some medicines can make you feel dizzy. This can increase your chance of falling. Ask your doctor what other things that you can do to help prevent falls. This information is not intended to replace advice given to you by your health care provider. Make sure you discuss any questions you have with your health care provider. Document Released: 10/02/2009 Document Revised: 05/13/2016 Document Reviewed: 01/10/2015 Elsevier Interactive Patient Education  2017 [employer].

## 2020-10-16 NOTE — Progress Notes (Signed)
This service is provided via telemedicine  No vital signs collected/recorded due to the encounter was a telemedicine visit.   Location of patient (ex: home, work):  Home  Patient consents to a telephone visit:  Yes, see encounte dated 10/28/20221  Location of the provider (ex: office, home):  [place]  Name of any referring provider:  N/A  Names of all persons participating in the telemedicine service and their role in the encounter:  Sherrie Mustache, Nurse Practitioner, Carroll Kinds, CMA, and patient.   Time spent on call:  8 minutes with medical assistant

## 2020-10-16 NOTE — Telephone Encounter (Signed)
[name], gambale are scheduled for a virtual visit with your provider today.    Just as we do with appointments in the office, we must obtain your consent to participate.  Your consent will be active for this visit and any virtual visit you may have with one of our providers in the next 365 days.    If you have a MyChart account, I can also send a copy of this consent to you electronically.  All virtual visits are billed to your insurance company just like a traditional visit in the office.  As this is a virtual visit, video technology does not allow for your provider to perform a traditional examination.  This may limit your provider's ability to fully assess your condition.  If your provider identifies any concerns that need to be evaluated in person or the need to arrange testing such as labs, EKG, etc, we will make arrangements to do so.    Although advances in technology are sophisticated, we cannot ensure that it will always work on either your end or our end.  If the connection with a video visit is poor, we may have to switch to a telephone visit.  With either a video or telephone visit, we are not always able to ensure that we have a secure connection.   I need to obtain your verbal consent now.   Are you willing to proceed with your visit today?   Isaiah Wu has provided verbal consent on 10/16/2020 for a virtual visit (video or telephone).   Carroll Kinds, CMA 10/16/2020  8:51 AM

## 2020-10-19 NOTE — Progress Notes (Signed)
Cardiology Office Note Date:  10/21/2020  Patient ID:  Isaiah, Wu [birth year], MRN [id number] PCP:  Lauree Chandler, NP  Cardiologist:  Dr. [name] >> Dr. Aundra Dubin Electrophysiologist: Dr. Caryl Comes     Chief Complaint:  annual visit  History of Present Illness: Isaiah Wu is a 69 y.o. male with history of NICM (unclear etiology, ?LBBB induced, c.MRI with uptate perhaps prior myocarditis), chronic CHF (systolic), HTN, LBBB, GERD, CRT-D  He comes in today to be seen for Dr. Caryl Comes, last seen by him Aug 2019, more recently for EP< saw A. Lynnell Jude, NP, doing well, intact device function, no changes were made.  He saw Dr. Aundra Dubin in 10/14/20, doing well, good exertional capacity, >99% BP, no changes were made.  Last TTE in Jan noted LVEF 45-50%, planned for 6 mo echo.  TODAY He is doing well. Very good exertional capacity, wishes he had a little better energy, but denies any intolerances. No CP or palpitations, no cardiac awareness. No rest SOB, minimal if at all DOE No dizzy spells, near syncope or syncope. He infrequently has phrenic stimulation, not bothersome or persistent, described as infrequent (since implant)   Device information [id number] [id number] NICM, LBBB History of appropriate therapy:No History of AAD therapy:No   Past Medical History:  Diagnosis Date  . Cardiomyopathy ([place]) 04/07/2015  . Chronic systolic CHF (congestive heart failure) ([place]) 04/14/2015   a. s/p STJ CRTD b. EF normalized with CRT therapy  . Hx of colonoscopy    Per [place] new patient packet  . Hypertension   . LBBB (left bundle branch block)   . Syncope 05/30/2015    Past Surgical History:  Procedure Laterality Date  . CARDIAC CATHETERIZATION    . COLONOSCOPY     Per [place] new patient packet  . EP IMPLANTABLE DEVICE N/A 07/21/2015   STJ CRTD implanted by Dr Caryl Comes for NICM, CHF  . LEFT AND RIGHT HEART CATHETERIZATION WITH CORONARY ANGIOGRAM N/A 04/09/2015   Procedure: LEFT  AND RIGHT HEART CATHETERIZATION WITH CORONARY ANGIOGRAM;  Surgeon: Jacolyn Reedy, MD;  Location: [facility] CATH LAB;  Service: Cardiovascular;  Laterality: N/A;  . ORIF ELBOW FRACTURE Left 1981  . SHOULDER SURGERY Left 05/2008   "had to put cadavear bone in and reattach muscle to it"  . TONSILLECTOMY AND ADENOIDECTOMY  1960's    Current Outpatient Medications  Medication Sig Dispense Refill  . carvedilol (COREG) 12.5 MG tablet TAKE 1 TABLET (12.5 MG TOTAL) BY MOUTH 2 (TWO) TIMES DAILY WITH A MEAL. 180 tablet 3  . furosemide (LASIX) 20 MG tablet TAKE 1 TABLET BY MOUTH EVERY DAY AS NEEDED 90 tablet 2  . Multiple Vitamins-Minerals (CENTRUM SILVER 50+MEN) TABS Take by mouth.    . sacubitril-valsartan (ENTRESTO) 49-51 MG Take 1 tablet by mouth 2 (two) times daily. 60 tablet 6  . spironolactone (ALDACTONE) 25 MG tablet TAKE 1 TABLET BY MOUTH EVERY DAY 90 tablet 3   No current facility-administered medications for this visit.    Allergies:   Patient has no known allergies.   Social History:  The patient  reports that he has quit smoking. His smoking use included cigarettes. He has a 2.00 pack-year smoking history. He has never used smokeless tobacco. He reports current alcohol use. He reports that he does not use drugs.   Family History:  The patient's family history includes Bone cancer in his father; CVA in his mother; Diabetes in his father; High blood pressure in his sister.  ROS:  Please see the history of present illness.    All other systems are reviewed and otherwise negative.   PHYSICAL EXAM:  VS:  BP 110/74   Pulse 71   Ht 6' (1.829 m)   Wt 223 lb 9.6 oz (101.4 kg)   SpO2 97%   BMI 30.33 kg/m  BMI: Body mass index is 30.33 kg/m. Well nourished, well developed, in no acute distress HEENT: normocephalic, atraumatic Neck: no JVD, carotid bruits or masses Cardiac:  RRR; no significant murmurs, no rubs, or gallops Lungs:  CTA b/l, no wheezing, rhonchi or rales Abd: soft,  nontender MS: no deformity or atrophy Ext: no edema Skin: warm and dry, no rash Neuro:  No gross deficits appreciated Psych: euthymic mood, full affect  ICD site is stable, no tethering or discomfort   EKG:  Not done today   Device interrogation done today and reviewed by myself:  Battery and lead measurements are good No arrhythmias LV1 threshold increased to 2.5V from 2.0V/0.4mg , is 1.5V/1.68ms Current programming will not allow changing his PW to 1.65ms, in d/w SJM rep, is timing issue with MP pacing. His LV1 output changed to 3.0V to keep his current pacing intact given his excllent clinicl improvement. Cyber security update was completed.    Echo (4/16) with EF 15%, severe diffuse hypokinesis [name]).   RHC/LHC (4/16) with normal coronaries; mean RA 10, PA 58/35 mean 48, mean PCWP 32, no comment on CO.  HIV negative, TSH normal, ferritin normal, SPEP normal. S/p St Jude CRT-D 07/21/15.  - cMRI  04/2015 with severely dilated LV, EF 14%, septal-lateral dyssynchrony (prominent), normal RV size with mild to moderately decreased systolic function, at least moderate functional mitral regurgitation, there was basal inferoseptal subepicardial LGE (small area) and apical septal subtle mid-wall LGE (small area).   - Echo (9/16) with EF 25-30%, mild MR => improved.  - CPX (1/17) with VE/VCO2 26.2, peak VO2 20.3, RER 1.13 => mild heart failure limitation.   - Echo (2/17) EF 40-45%.   - Echo (4/18) with EF 50-55% - Echo (10/18): EF 50-55%, mild RV dilation with normal systolic function.  - Echo (10/19): EF 45-50%, RV mildly dilated, normal systolic function.  - Echo (1/21): EF 45-50%, low normal RV function.    Recent Labs: 09/15/2020: ALT 31; Hemoglobin 16.8; Platelets 367 10/13/2020: BUN 16; Creat 1.16; Potassium 4.1; Sodium 138  09/15/2020: Cholesterol 155; HDL 46; LDL Cholesterol (Calc) 86; Total CHOL/HDL Ratio 3.4; Triglycerides 133   Estimated Creatinine Clearance: 76.1 mL/min (by C-G  formula based on SCr of 1.16 mg/dL).   Wt Readings from Last 3 Encounters:  10/21/20 223 lb 9.6 oz (101.4 kg)  10/14/20 223 lb 9.6 oz (101.4 kg)  09/15/20 222 lb 12.8 oz (101.1 kg)     Other studies reviewed: Additional studies/records reviewed today include: summarized above  ASSESSMENT AND PLAN:  1. CRT-D     Intact function, programmed as above  2. NICM 3. Chronic CHF (systolic) with some recovery of LVEF      Feels very good     No symptoms or exam findings of volume OL      CorVue wobbles up/down     On BB, entresto, furosemide and aldactone     C/w dr. Aundra Dubin  4. HTN     Looks good    Disposition: F/u with remotes, and in clinic EP visit in 1 year, sooner if needed.  Current medicines are reviewed at length with the patient today.  The patient  did not have any concerns regarding medicines.  Venetia Night, PA-C 10/21/2020 6:35 PM     [address] [place] [place] [address] [phone] (office)  [phone] (fax)

## 2020-10-21 ENCOUNTER — Encounter: Payer: Self-pay | Admitting: Physician Assistant

## 2020-10-21 ENCOUNTER — Ambulatory Visit: Payer: Medicare Other | Admitting: Physician Assistant

## 2020-10-21 ENCOUNTER — Other Ambulatory Visit: Payer: Self-pay

## 2020-10-21 VITALS — BP 110/74 | HR 71 | Ht 72.0 in | Wt 223.6 lb

## 2020-10-21 DIAGNOSIS — Z95810 Presence of automatic (implantable) cardiac defibrillator: Secondary | ICD-10-CM

## 2020-10-21 DIAGNOSIS — I5022 Chronic systolic (congestive) heart failure: Secondary | ICD-10-CM

## 2020-10-21 DIAGNOSIS — I10 Essential (primary) hypertension: Secondary | ICD-10-CM | POA: Diagnosis not present

## 2020-10-21 DIAGNOSIS — I428 Other cardiomyopathies: Secondary | ICD-10-CM | POA: Diagnosis not present

## 2020-10-21 NOTE — Patient Instructions (Addendum)
Medication Instructions:  Your physician recommends that you continue on your current medications as directed. Please refer to the Current Medication list given to you today.  *If you need a refill on your cardiac medications before your next appointment, please call your pharmacy*   Lab Work: NONE ORDERED  TODAY  If you have labs (blood work) drawn today and your tests are completely normal, you will receive your results only by: . MyChart Message (if you have MyChart) OR . A paper copy in the mail If you have any lab test that is abnormal or we need to change your treatment, we will call you to review the results.   Testing/Procedures: NONE ORDERED  TODAY   Follow-Up: At [facility], you and your health needs are our priority.  As part of our continuing mission to provide you with exceptional heart care, we have created designated Provider Care Teams.  These Care Teams include your primary Cardiologist (physician) and Advanced Practice Providers (APPs -  Physician Assistants and Nurse Practitioners) who all work together to provide you with the care you need, when you need it.  We recommend signing up for the patient portal called "MyChart".  Sign up information is provided on this After Visit Summary.  MyChart is used to connect with patients for Virtual Visits (Telemedicine).  Patients are able to view lab/test results, encounter notes, upcoming appointments, etc.  Non-urgent messages can be sent to your provider as well.   To learn more about what you can do with MyChart, go to [email or web address].com.    Your next appointment:   1 year(s)  The format for your next appointment:   In Person  Provider:   You may see Dr. Klein  or one of the following Advanced Practice Providers on your designated Care Team:    Amber Seiler, NP  Renee Ursuy, PA-C  Michael "Andy" Tillery, PA-C    Other Instructions   

## 2020-12-03 ENCOUNTER — Other Ambulatory Visit (HOSPITAL_COMMUNITY): Payer: Self-pay | Admitting: Cardiology

## 2020-12-09 ENCOUNTER — Telehealth (HOSPITAL_COMMUNITY): Payer: Self-pay | Admitting: Pharmacy Technician

## 2020-12-09 NOTE — Telephone Encounter (Signed)
It's time to re-enroll patient to receive medication assistance for Entresto from [employer]. Left voicemail for patient to call me back so that we can start the re-enrollment process.  Charlann Boxer, CPhT

## 2020-12-11 ENCOUNTER — Telehealth (HOSPITAL_COMMUNITY): Payer: Self-pay | Admitting: Pharmacy Technician

## 2020-12-11 NOTE — Telephone Encounter (Signed)
Spoke with the patient, he is going to come and pick up the provider's portion of the [employer] application and send it in to the company himself.  Will call when it is ready.

## 2020-12-31 ENCOUNTER — Ambulatory Visit (INDEPENDENT_AMBULATORY_CARE_PROVIDER_SITE_OTHER): Payer: Medicare Other

## 2020-12-31 DIAGNOSIS — I428 Other cardiomyopathies: Secondary | ICD-10-CM

## 2020-12-31 LAB — CUP PACEART REMOTE DEVICE CHECK
Battery Remaining Longevity: 17 mo
Battery Remaining Percentage: 28 %
Battery Voltage: 2.84 V
Brady Statistic AP VP Percent: 28 %
Brady Statistic AP VS Percent: 1 %
Brady Statistic AS VP Percent: 71 %
Brady Statistic AS VS Percent: 1 %
Brady Statistic RA Percent Paced: 28 %
Date Time Interrogation Session: 20220112040015
HighPow Impedance: 75 Ohm
HighPow Impedance: 75 Ohm
Implantable Lead Implant Date: 20160801
Implantable Lead Implant Date: 20160801
Implantable Lead Implant Date: 20160801
Implantable Lead Location: 753858
Implantable Lead Location: 753859
Implantable Lead Location: 753860
Implantable Lead Model: 7122
Implantable Pulse Generator Implant Date: 20160801
Lead Channel Impedance Value: 1075 Ohm
Lead Channel Impedance Value: 440 Ohm
Lead Channel Impedance Value: 460 Ohm
Lead Channel Pacing Threshold Amplitude: 0.75 V
Lead Channel Pacing Threshold Amplitude: 1 V
Lead Channel Pacing Threshold Amplitude: 2 V
Lead Channel Pacing Threshold Pulse Width: 0.5 ms
Lead Channel Pacing Threshold Pulse Width: 0.5 ms
Lead Channel Pacing Threshold Pulse Width: 0.7 ms
Lead Channel Sensing Intrinsic Amplitude: 12 mV
Lead Channel Sensing Intrinsic Amplitude: 2.7 mV
Lead Channel Setting Pacing Amplitude: 1.75 V
Lead Channel Setting Pacing Amplitude: 2.5 V
Lead Channel Setting Pacing Amplitude: 3 V
Lead Channel Setting Pacing Pulse Width: 0.5 ms
Lead Channel Setting Pacing Pulse Width: 0.5 ms
Lead Channel Setting Sensing Sensitivity: 0.5 mV
Pulse Gen Serial Number: 7290135

## 2021-01-13 NOTE — Progress Notes (Signed)
Remote ICD transmission.   

## 2021-01-14 ENCOUNTER — Ambulatory Visit (HOSPITAL_COMMUNITY)
Admission: RE | Admit: 2021-01-14 | Discharge: 2021-01-14 | Disposition: A | Discharge status: Home / Self Care | Payer: Medicare Other | Source: Ambulatory Visit | Attending: Internal Medicine | Admitting: Internal Medicine

## 2021-01-14 ENCOUNTER — Other Ambulatory Visit: Payer: Self-pay

## 2021-01-14 DIAGNOSIS — I5022 Chronic systolic (congestive) heart failure: Secondary | ICD-10-CM | POA: Insufficient documentation

## 2021-01-14 LAB — BASIC METABOLIC PANEL
Anion gap: 9 (ref 5–15)
BUN: 15 mg/dL (ref 8–23)
CO2: 26 mmol/L (ref 22–32)
Calcium: 9.7 mg/dL (ref 8.9–10.3)
Chloride: 106 mmol/L (ref 98–111)
Creatinine, Ser: 1.08 mg/dL (ref 0.61–1.24)
GFR, Estimated: >60 mL/min (ref >60)
Glucose, Bld: 108 mg/dL — ABNORMAL HIGH (ref 70–99)
Potassium: 4.2 mmol/L (ref 3.5–5.1)
Sodium: 141 mmol/L (ref 135–145)

## 2021-02-04 ENCOUNTER — Telehealth (HOSPITAL_COMMUNITY): Payer: Self-pay | Admitting: Pharmacist

## 2021-02-04 NOTE — Telephone Encounter (Signed)
Advanced Heart Failure Patient Advocate Encounter   Patient was approved to receive Entresto from [employer]   Patient ID: [id number]  Effective dates: 02/03/2021 through 12/19/2021     Audry Riles, PharmD, BCPS, BCCP, CPP Heart Failure Clinic Pharmacist [phone]

## 2021-03-16 ENCOUNTER — Other Ambulatory Visit: Payer: Self-pay

## 2021-03-16 ENCOUNTER — Encounter: Payer: Self-pay | Admitting: Nurse Practitioner

## 2021-03-16 ENCOUNTER — Ambulatory Visit (INDEPENDENT_AMBULATORY_CARE_PROVIDER_SITE_OTHER): Payer: Medicare Other | Admitting: Nurse Practitioner

## 2021-03-16 VITALS — BP 124/82 | HR 72 | Temp 96.4°F | Ht 72.0 in | Wt 231.0 lb

## 2021-03-16 DIAGNOSIS — E6609 Other obesity due to excess calories: Secondary | ICD-10-CM

## 2021-03-16 DIAGNOSIS — Z6830 Body mass index (BMI) 30.0-30.9, adult: Secondary | ICD-10-CM

## 2021-03-16 DIAGNOSIS — I10 Essential (primary) hypertension: Secondary | ICD-10-CM | POA: Diagnosis not present

## 2021-03-16 DIAGNOSIS — I5022 Chronic systolic (congestive) heart failure: Secondary | ICD-10-CM

## 2021-03-16 DIAGNOSIS — J302 Other seasonal allergic rhinitis: Secondary | ICD-10-CM | POA: Diagnosis not present

## 2021-03-16 NOTE — Progress Notes (Unsigned)
Careteam: Patient Care Team: Lauree Chandler, NP as PCP - General (Geriatric Medicine) Jacolyn Reedy, MD as Consulting Physician (Cardiology) Larey Dresser, MD as Consulting Physician (Cardiology) Deboraha Sprang, MD as Consulting Physician (Cardiology)  PLACE OF SERVICE:  [place] Directive information Does Patient Have a Medical Advance Directive?: No, Would patient like information on creating a medical advance directive?: Yes (MAU/Ambulatory/Procedural Areas - Information given)  No Known Allergies  Chief Complaint  Patient presents with  . Medical Management of Chronic Issues    6 month follow-up      HPI: Patient is a 70 y.o. male routine follow up.   A lot of moving around since last visit. Has not made appt for colonoscopy due to lots going on in the family but plans to make appt.  Continues to try to maintain healthy lifestyle with eating correctly and exercise.   Season allergies- using Claritin 10 mg daily.    CHF/cardiomyopathy- close follow up with cardiology, continues on lasix, coreg, entresto, and aldactone   Review of Systems:  Review of Systems  Constitutional: Negative for chills, fever and weight loss.  HENT: Negative for tinnitus.   Respiratory: Negative for cough, sputum production and shortness of breath.   Cardiovascular: Negative for chest pain, palpitations and leg swelling.  Gastrointestinal: Negative for abdominal pain, constipation, diarrhea and heartburn.  Genitourinary: Negative for dysuria, frequency and urgency.  Musculoskeletal: Negative for back pain, falls, joint pain and myalgias.  Skin: Negative.   Neurological: Negative for dizziness and headaches.  Psychiatric/Behavioral: Negative for depression and memory loss. The patient does not have insomnia.     Past Medical History:  Diagnosis Date  . Cardiomyopathy ([place]) 04/07/2015  . Chronic systolic CHF (congestive heart failure) ([place]) 04/14/2015   a. s/p STJ  CRTD b. EF normalized with CRT therapy  . Hx of colonoscopy    Per [place] new patient packet  . Hypertension   . LBBB (left bundle branch block)   . Syncope 05/30/2015   Past Surgical History:  Procedure Laterality Date  . CARDIAC CATHETERIZATION    . COLONOSCOPY     Per [place] new patient packet  . EP IMPLANTABLE DEVICE N/A 07/21/2015   STJ CRTD implanted by Dr Caryl Comes for NICM, CHF  . LEFT AND RIGHT HEART CATHETERIZATION WITH CORONARY ANGIOGRAM N/A 04/09/2015   Procedure: LEFT AND RIGHT HEART CATHETERIZATION WITH CORONARY ANGIOGRAM;  Surgeon: Jacolyn Reedy, MD;  Location: [facility] C[facility] LAB;  Service: Cardiovascular;  Laterality: N/A;  . ORIF ELBOW FRACTURE Left 1981  . SHOULDER SURGERY Left 05/2008   "had to put cadavear bone in and reattach muscle to it"  . TONSILLECTOMY AND ADENOIDECTOMY  1960's   Social History:   reports that he has quit smoking. His smoking use included cigarettes. He has a 2.00 pack-year smoking history. He has never used smokeless tobacco. He reports current alcohol use. He reports that he does not use drugs.  Family History  Problem Relation Age of Onset  . Bone cancer Father   . Diabetes Father   . CVA Mother   . High blood pressure Sister     Medications: Patient's Medications  New Prescriptions   No medications on file  Previous Medications   CARVEDILOL (COREG) 12.5 MG TABLET    TAKE 1 TABLET (12.5 MG TOTAL) BY MOUTH 2 (TWO) TIMES DAILY WITH A MEAL.   FUROSEMIDE (LASIX) 20 MG TABLET    TAKE 1 TABLET BY MOUTH EVERY  DAY AS NEEDED   LORATADINE (CLARITIN) 10 MG TABLET    Take 10 mg by mouth daily.   MULTIPLE VITAMINS-MINERALS (CENTRUM SILVER 50+MEN) TABS    Take by mouth.   SACUBITRIL-VALSARTAN (ENTRESTO) 49-51 MG    Take 1 tablet by mouth 2 (two) times daily.   SPIRONOLACTONE (ALDACTONE) 25 MG TABLET    TAKE 1 TABLET BY MOUTH EVERY DAY  Modified Medications   No medications on file  Discontinued Medications   No medications on file    Physical  Exam:  Vitals:   03/16/21 0835  BP: 124/82  Pulse: 72  Temp: (!) 96.4 F (35.8 C)  TempSrc: Temporal  SpO2: 97%  Weight: 231 lb (104.8 kg)  Height: 6' (1.829 m)   Body mass index is 31.33 kg/m. Wt Readings from Last 3 Encounters:  03/16/21 231 lb (104.8 kg)  10/21/20 223 lb 9.6 oz (101.4 kg)  10/14/20 223 lb 9.6 oz (101.4 kg)    Physical Exam Constitutional:      General: He is not in acute distress.    Appearance: He is well-developed. He is not diaphoretic.  HENT:     Head: Normocephalic and atraumatic.     Mouth/Throat:     Pharynx: No oropharyngeal exudate.  Eyes:     Conjunctiva/sclera: Conjunctivae normal.     Pupils: Pupils are equal, round, and reactive to light.  Cardiovascular:     Rate and Rhythm: Normal rate and regular rhythm.     Heart sounds: Normal heart sounds.  Pulmonary:     Effort: Pulmonary effort is normal.     Breath sounds: Normal breath sounds.  Abdominal:     General: Bowel sounds are normal.     Palpations: Abdomen is soft.  Musculoskeletal:        General: No tenderness.     Cervical back: Normal range of motion and neck supple.  Skin:    General: Skin is warm and dry.  Neurological:     Mental Status: He is alert and oriented to person, place, and time.      Labs reviewed: Basic Metabolic Panel: Recent Labs    09/15/20 0956 10/13/20 0816 01/14/21 0856  NA 140 138 141  K 4.8 4.1 4.2  CL 104 106 106  CO2 24 23 26   GLUCOSE 98 100* 108*  BUN 15 16 15   CREATININE 1.14 1.16 1.08  CALCIUM 10.5* 9.7 9.7   Liver Function Tests: Recent Labs    09/15/20 0956  AST 22  ALT 31  BILITOT 0.7  PROT 7.5   No results for input(s): LIPASE, AMYLASE in the last 8760 hours. No results for input(s): AMMONIA in the last 8760 hours. CBC: Recent Labs    09/15/20 0956  WBC 8.3  NEUTROABS 4,988  HGB 16.8  HCT 49.2  MCV 90.8  PLT 367   Lipid Panel: Recent Labs    09/15/20 0956  CHOL 155  HDL 46  LDLCALC 86  TRIG 133   CHOLHDL 3.4   TSH: No results for input(s): TSH in the last 8760 hours. A1C: No results found for: HGBA1C   Assessment/Plan 1. Chronic systolic CHF (congestive heart failure) (HCC) -stable, euvolemic. Followed by cardiology, continues on entresto, lasix, aldactone and coreg.  2. Essential hypertension Well controlled on current regimen.   3. Seasonal allergies Stable, continues claritin 10 mg daily  4. Obesity  Noted today, discussed healthy lifestyle with dietary modifications and activity/exericse as tolerates.   Next appt: 6 month, labs  at appt.  Carlos American. [place], [place] [facility] [phone]

## 2021-03-16 NOTE — Patient Instructions (Addendum)
Please sign record release- make sure to fill in previous PCP.   Ophthalmology- [facility] eye care Address: [address] [place], [place], [place] [address] [phone]

## 2021-04-01 ENCOUNTER — Ambulatory Visit (INDEPENDENT_AMBULATORY_CARE_PROVIDER_SITE_OTHER): Payer: Medicare Other

## 2021-04-01 DIAGNOSIS — I428 Other cardiomyopathies: Secondary | ICD-10-CM

## 2021-04-02 LAB — CUP PACEART REMOTE DEVICE CHECK
Battery Remaining Longevity: 14 mo
Battery Remaining Percentage: 23 %
Battery Voltage: 2.8 V
Brady Statistic AP VP Percent: 28 %
Brady Statistic AP VS Percent: 1 %
Brady Statistic AS VP Percent: 72 %
Brady Statistic AS VS Percent: 1 %
Brady Statistic RA Percent Paced: 28 %
Date Time Interrogation Session: 20220413212308
HighPow Impedance: 73 Ohm
HighPow Impedance: 73 Ohm
Implantable Lead Implant Date: 20160801
Implantable Lead Implant Date: 20160801
Implantable Lead Implant Date: 20160801
Implantable Lead Location: 753858
Implantable Lead Location: 753859
Implantable Lead Location: 753860
Implantable Lead Model: 7122
Implantable Pulse Generator Implant Date: 20160801
Lead Channel Impedance Value: 1150 Ohm
Lead Channel Impedance Value: 390 Ohm
Lead Channel Impedance Value: 460 Ohm
Lead Channel Pacing Threshold Amplitude: 0.75 V
Lead Channel Pacing Threshold Amplitude: 1 V
Lead Channel Pacing Threshold Amplitude: 2 V
Lead Channel Pacing Threshold Pulse Width: 0.5 ms
Lead Channel Pacing Threshold Pulse Width: 0.5 ms
Lead Channel Pacing Threshold Pulse Width: 0.7 ms
Lead Channel Sensing Intrinsic Amplitude: 11.7 mV
Lead Channel Sensing Intrinsic Amplitude: 4 mV
Lead Channel Setting Pacing Amplitude: 1.75 V
Lead Channel Setting Pacing Amplitude: 2.5 V
Lead Channel Setting Pacing Amplitude: 3 V
Lead Channel Setting Pacing Pulse Width: 0.5 ms
Lead Channel Setting Pacing Pulse Width: 0.5 ms
Lead Channel Setting Sensing Sensitivity: 0.5 mV
Pulse Gen Serial Number: 7290135

## 2021-04-16 NOTE — Progress Notes (Signed)
Remote ICD transmission.   

## 2021-05-02 ENCOUNTER — Encounter: Payer: Self-pay | Admitting: Nurse Practitioner

## 2021-05-12 ENCOUNTER — Encounter: Payer: Self-pay | Admitting: Nurse Practitioner

## 2021-05-12 DIAGNOSIS — R972 Elevated prostate specific antigen [PSA]: Secondary | ICD-10-CM | POA: Insufficient documentation

## 2021-07-01 ENCOUNTER — Ambulatory Visit (INDEPENDENT_AMBULATORY_CARE_PROVIDER_SITE_OTHER): Payer: Medicare Other

## 2021-07-01 DIAGNOSIS — I428 Other cardiomyopathies: Secondary | ICD-10-CM

## 2021-07-01 LAB — CUP PACEART REMOTE DEVICE CHECK
Battery Remaining Longevity: 14 mo
Battery Remaining Percentage: 24 %
Battery Voltage: 2.8 V
Brady Statistic AP VP Percent: 27 %
Brady Statistic AP VS Percent: 1 %
Brady Statistic AS VP Percent: 72 %
Brady Statistic AS VS Percent: 1 %
Brady Statistic RA Percent Paced: 27 %
Date Time Interrogation Session: 20220713040017
HighPow Impedance: 77 Ohm
HighPow Impedance: 77 Ohm
Implantable Lead Implant Date: 20160801
Implantable Lead Implant Date: 20160801
Implantable Lead Implant Date: 20160801
Implantable Lead Location: 753858
Implantable Lead Location: 753859
Implantable Lead Location: 753860
Implantable Lead Model: 7122
Implantable Pulse Generator Implant Date: 20160801
Lead Channel Impedance Value: 390 Ohm
Lead Channel Impedance Value: 460 Ohm
Lead Channel Impedance Value: 940 Ohm
Lead Channel Pacing Threshold Amplitude: 0.75 V
Lead Channel Pacing Threshold Amplitude: 1 V
Lead Channel Pacing Threshold Amplitude: 2 V
Lead Channel Pacing Threshold Pulse Width: 0.5 ms
Lead Channel Pacing Threshold Pulse Width: 0.5 ms
Lead Channel Pacing Threshold Pulse Width: 0.7 ms
Lead Channel Sensing Intrinsic Amplitude: 11.7 mV
Lead Channel Sensing Intrinsic Amplitude: 3.2 mV
Lead Channel Setting Pacing Amplitude: 1.75 V
Lead Channel Setting Pacing Amplitude: 2.5 V
Lead Channel Setting Pacing Amplitude: 3 V
Lead Channel Setting Pacing Pulse Width: 0.5 ms
Lead Channel Setting Pacing Pulse Width: 0.5 ms
Lead Channel Setting Sensing Sensitivity: 0.5 mV
Pulse Gen Serial Number: 7290135

## 2021-07-07 ENCOUNTER — Encounter: Payer: Self-pay | Admitting: Gastroenterology

## 2021-07-14 ENCOUNTER — Encounter (HOSPITAL_COMMUNITY): Payer: Self-pay | Admitting: Cardiology

## 2021-07-14 ENCOUNTER — Ambulatory Visit (HOSPITAL_BASED_OUTPATIENT_CLINIC_OR_DEPARTMENT_OTHER)
Admission: RE | Admit: 2021-07-14 | Discharge: 2021-07-14 | Disposition: A | Discharge status: Home / Self Care | Payer: Medicare Other | Source: Ambulatory Visit | Attending: Cardiology | Admitting: Cardiology

## 2021-07-14 ENCOUNTER — Ambulatory Visit (HOSPITAL_COMMUNITY)
Admission: RE | Admit: 2021-07-14 | Discharge: 2021-07-14 | Disposition: A | Discharge status: Home / Self Care | Payer: Medicare Other | Source: Ambulatory Visit | Attending: Nurse Practitioner | Admitting: Nurse Practitioner

## 2021-07-14 ENCOUNTER — Other Ambulatory Visit: Payer: Self-pay

## 2021-07-14 VITALS — BP 100/60 | HR 71 | Wt 226.4 lb

## 2021-07-14 DIAGNOSIS — I5022 Chronic systolic (congestive) heart failure: Secondary | ICD-10-CM | POA: Diagnosis not present

## 2021-07-14 DIAGNOSIS — I447 Left bundle-branch block, unspecified: Secondary | ICD-10-CM | POA: Insufficient documentation

## 2021-07-14 DIAGNOSIS — Z87891 Personal history of nicotine dependence: Secondary | ICD-10-CM | POA: Insufficient documentation

## 2021-07-14 DIAGNOSIS — Z7901 Long term (current) use of anticoagulants: Secondary | ICD-10-CM | POA: Diagnosis not present

## 2021-07-14 DIAGNOSIS — R0600 Dyspnea, unspecified: Secondary | ICD-10-CM | POA: Insufficient documentation

## 2021-07-14 DIAGNOSIS — Z79899 Other long term (current) drug therapy: Secondary | ICD-10-CM | POA: Diagnosis not present

## 2021-07-14 DIAGNOSIS — I428 Other cardiomyopathies: Secondary | ICD-10-CM | POA: Diagnosis not present

## 2021-07-14 DIAGNOSIS — R011 Cardiac murmur, unspecified: Secondary | ICD-10-CM | POA: Diagnosis not present

## 2021-07-14 DIAGNOSIS — R55 Syncope and collapse: Secondary | ICD-10-CM | POA: Insufficient documentation

## 2021-07-14 DIAGNOSIS — I110 Hypertensive heart disease with heart failure: Secondary | ICD-10-CM | POA: Diagnosis not present

## 2021-07-14 LAB — BASIC METABOLIC PANEL
Anion gap: 9 (ref 5–15)
BUN: 13 mg/dL (ref 8–23)
CO2: 25 mmol/L (ref 22–32)
Calcium: 9.6 mg/dL (ref 8.9–10.3)
Chloride: 106 mmol/L (ref 98–111)
Creatinine, Ser: 1.1 mg/dL (ref 0.61–1.24)
GFR, Estimated: >60 mL/min (ref >60)
Glucose, Bld: 90 mg/dL (ref 70–99)
Potassium: 3.9 mmol/L (ref 3.5–5.1)
Sodium: 140 mmol/L (ref 135–145)

## 2021-07-14 LAB — LIPID PANEL
Cholesterol: 154 mg/dL (ref 0–200)
HDL: 50 mg/dL (ref >40)
LDL Cholesterol: 86 mg/dL (ref 0–99)
Total CHOL/HDL Ratio: 3.1 RATIO
Triglycerides: 90 mg/dL (ref <150)
VLDL: 18 mg/dL (ref 0–40)

## 2021-07-14 LAB — ECHOCARDIOGRAM COMPLETE
Area-P 1/2: 4.6 cm2
S' Lateral: 3.9 cm

## 2021-07-14 NOTE — Progress Notes (Signed)
Patient ID: Isaiah Wu, male   DOB: [birth year], 70 y.o.   MRN: [id number] PCP: Dr. Rex Kras HF Cardiology: Dr. Aundra Dubin  70 y.o. with minimal PMH diagnosed with systolic HF in [id number].Echo showed EF 15% with diffuse hypokinesis.  Initially seen by Dr. [name]. He was admitted to [facility] in 4/16 for diuresis.  RHC/LHC showed elevated filling pressures and no significant coronary disease.  Underwent St Jude BiV-ICD on 07/21/15.  Repeat echo in 2/17 showed EF up to 40-45%.  Echo in 10/18 showed EF up to 50-55%.  Echo in 10/19 with EF 45-50%.  Echo in 1/21 was stable with EF 45-50%.    Echo was done today and reviewed, EF 50-55%, normal RV, normal IVC.   He returns for followup of CHF/dyspnea.  No exertional dyspnea or chest pain.  No orthopnea/PND.  No palpitations.  His wife says that he has been snoring and gasping in his sleep.   ECG (personally reviewed): a-paced, BiV paced  St Jude device interrogation: >99% BiV pacing, stable thoracic impedance.    Labs 4/16: TSH normal, SPEP negative, ferritin normal, HIV negative, K 4.6, creatinine 1.22, HCT 46.9 Labs 6/16: K 3.8, Creatinine 1.08 Labs 07/18/15: K 4.1, Creatinine 0.99 Labs 9/16: digoxin 0.3, K 4.3, creatinine 1.03 Labs 11/16: K 4.3, creatinine 1.09, digoxin 0.6, BNP 23 Labs 2/17: K 4.2, creatinine 1.13, BNP 15.9, digoxin 0.4 Labs 3/17: K 4.2, creatinine 1.09, LDL 65 Labs 6/17: digoxin 0.3 Labs 7/17: K 4.3, creatinine 1.19 Labs 8/17: digoxin 0.5, K 3.9, creatinine 1.15 Labs 4/18: LDL 71 Labs 7/18: K 4, creatinine 1.15 Labs 10/18: K 4, creatinine 1.03, TSH normal, hgb 15.9 Labs 7/19: LDL 98 Labs 10/19: K 4.1, creatinine 1.13  Labs 1/20: creatinine 0.98 Labs 10/20: LDL 90 Labs 1/21: K 4.6, creatinine 1.12 Labs 9/21: LDL 86 Labs 10/21: K 4.1, creatinine 1.16 Labs 1/22: K 4.3, creatinine 1.08  PMH: 1. HTN 2. GERD 3. LBBB 4. Cardiomyopathy: Nonischemic.  Echo (4/16) with EF 15%, severe diffuse hypokinesis [name]).  RHC/LHC (4/16) with  normal coronaries; mean RA 10, PA 58/35 mean 48, mean PCWP 32, no comment on CO.  HIV negative, TSH normal, ferritin normal, SPEP normal. S/p St Jude CRT-D 07/21/15.  - cMRI  04/2015 with severely dilated LV, EF 14%, septal-lateral dyssynchrony (prominent), normal RV size with mild to moderately decreased systolic function, at least moderate functional mitral regurgitation, there was basal inferoseptal subepicardial LGE (small area) and apical septal subtle mid-wall LGE (small area).   - Echo (9/16) with EF 25-30%, mild MR => improved.  - CPX (1/17) with VE/VCO2 26.2, peak VO2 20.3, RER 1.13 => mild heart failure limitation.   - Echo (2/17) EF 40-45%.   - Echo (4/18) with EF 50-55% - Echo (10/18): EF 50-55%, mild RV dilation with normal systolic function.  - Echo (10/19): EF 45-50%, RV mildly dilated, normal systolic function.  - Echo (1/21): EF 45-50%, low normal RV function.  - Echo (7/22): EF 50-55%, normal RV, normal IVC 5. Syncopal episode 6/16: Monitor ok. Suspect orthostasis.     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  No cardiomyopathy or sudden death.   ROS: All systems reviewed and negative except as per HPI.   Current Outpatient Medications  Medication Sig Dispense Refill   carvedilol (COREG) 12.5 MG tablet TAKE 1 TABLET (12.5 MG TOTAL) BY MOUTH  2 (TWO) TIMES DAILY WITH A MEAL. 180 tablet 3   furosemide (LASIX) 20 MG tablet TAKE 1 TABLET BY MOUTH EVERY DAY AS NEEDED 90 tablet 2   loratadine (CLARITIN) 10 MG tablet Take 10 mg by mouth daily.     Multiple Vitamins-Minerals (CENTRUM SILVER 50+MEN) TABS Take by mouth.     sacubitril-valsartan (ENTRESTO) 49-51 MG Take 1 tablet by mouth 2 (two) times daily. 60 tablet 6   spironolactone (ALDACTONE) 25 MG tablet TAKE 1 TABLET BY MOUTH EVERY DAY 90 tablet 3   No current facility-administered medications for this  encounter.   BP 100/60   Pulse 71   Wt 102.7 kg (226 lb 6.4 oz)   SpO2 97%   BMI 30.71 kg/m  General: NAD Neck: No JVD, no thyromegaly or thyroid nodule.  Lungs: Clear to auscultation bilaterally with normal respiratory effort. CV: Nondisplaced PMI.  Heart regular S1/S2, no S3/S4, no murmur.  No peripheral edema.  No carotid bruit.  Normal pedal pulses.  Abdomen: Soft, nontender, no hepatosplenomegaly, no distention.  Skin: Intact without lesions or rashes.  Neurologic: Alert and oriented x 3.  Psych: Normal affect. Extremities: No clubbing or cyanosis.  HEENT: Normal.   Assessment/Plan:  1. Chronic systolic CHF: Nonischemic cardiomyopathy, EF 15% initially, up to 50-55% on 7/22 echo with medical treatment and St Jude CRT-D. Etiology of cardiomyopathy is uncertain: normal coronaries, no symptoms suggestive of viral syndrome prior to admission, HIV/Ferritin/SPEP/TSH unremarkable.  No history of familial CMP.  He has LBBB of uncertain duration.  ? LBBB cardiomyopathy.  However, there was also mid-wall LGE in the septum on cardiac MRI, suggesting possibility of myocarditis.  CPX in 1/17 suggested only a mild heart failure limitation.  NYHA class II.  He is euvolemic on exam and by Corvue.  - Continue spironolactone 25 mg daily, Entresto 49/51 bid.  BMET today.  - Continue Coreg 12.5 mg bid.     - I would like to see him increase exercise.      2. LBBB: Of uncertain duration.  3. Suspect OSA: I will arrange for a home sleep study.   Followup in 6 months.   Soul Hackman [place] 07/14/2021

## 2021-07-14 NOTE — Patient Instructions (Signed)
EKG done today.  Labs done today. We will contact you only if your labs are abnormal.  No medication changes were made. Please continue all current medications as prescribed.  Your provider has recommended that you have a home sleep study.  We have provided you with the equipment in our office today. Please download the app and follow the instructions. YOUR PIN NUMBER IS: [id number]. Once you have completed the test you just dispose of the equipment, the information is automatically uploaded to [place] via blue-tooth technology. If your test is positive for sleep apnea and you need a home CPAP machine you will be contacted by Dr Theodosia Blender office [facility]) to set this up.  Your physician recommends that you schedule a follow-up appointment in: 6 months. Please contact our office in December to schedule a January.   If you have any questions or concerns before your next appointment please send [place] a message through [place] or call our office at [phone].    TO LEAVE A MESSAGE FOR THE NURSE SELECT OPTION 2, PLEASE LEAVE A MESSAGE INCLUDING: YOUR NAME DATE OF BIRTH CALL BACK NUMBER REASON FOR CALL**this is important as we prioritize the call backs  YOU WILL RECEIVE A CALL BACK THE SAME DAY AS LONG AS YOU CALL BEFORE 4:00 PM   Do the following things EVERYDAY: Weigh yourself in the morning before breakfast. Write it down and keep it in a log. Take your medicines as prescribed Eat low salt foods--Limit salt (sodium) to 2000 mg per day.  Stay as active as you can everyday Limit all fluids for the day to less than 2 liters   At the [facility], you and your health needs are our priority. As part of our continuing mission to provide you with exceptional heart care, we have created designated Provider Care Teams. These Care Teams include your primary Cardiologist (physician) and Advanced Practice Providers (APPs- Physician Assistants and Nurse Practitioners) who all work together  to provide you with the care you need, when you need it.   You may see any of the following providers on your designated Care Team at your next follow up: Dr Glori Bickers Dr Haynes Kerns, NP Lyda Jester, [place] Audry Riles, PharmD   Please be sure to bring in all your medications bottles to every appointment.

## 2021-07-14 NOTE — Progress Notes (Signed)
  Echocardiogram 2D Echocardiogram has been performed.  Isaiah Wu G Trase Bunda 07/14/2021, 1:37 PM

## 2021-07-16 ENCOUNTER — Ambulatory Visit (AMBULATORY_SURGERY_CENTER): Payer: Medicare Other | Admitting: *Deleted

## 2021-07-16 ENCOUNTER — Other Ambulatory Visit: Payer: Self-pay

## 2021-07-16 ENCOUNTER — Other Ambulatory Visit: Payer: Self-pay | Admitting: Gastroenterology

## 2021-07-16 VITALS — Ht 72.0 in | Wt 222.0 lb

## 2021-07-16 DIAGNOSIS — Z1211 Encounter for screening for malignant neoplasm of colon: Secondary | ICD-10-CM

## 2021-07-16 MED ORDER — PEG 3350-KCL-NA BICARB-NACL 420 G PO SOLR
4000.0000 mL | Freq: Once | ORAL | 0 refills | Status: DC
Start: 2021-07-16 — End: 2021-07-16

## 2021-07-16 NOTE — Progress Notes (Signed)
Patient's pre-visit was done today over the phone with the patient due to COVID-19 pandemic. Name,DOB and address verified. Insurance verified. Patient denies any allergies to Eggs and Soy. Patient denies any problems with anesthesia/sedation. Patient denies taking diet pills or blood thinners. No home Oxygen. Packet of Prep instructions mailed to patient including a copy of a consent form-pt is aware. Patient understands to call us back with any questions or concerns. Patient is aware of our care-partner policy and Covid-19 safety protocol.   EMMI education assigned to the patient for the procedure, sent to MyChart.   The patient is COVID-19 vaccinated, per patient.  

## 2021-07-24 NOTE — Progress Notes (Signed)
Remote ICD transmission.   

## 2021-07-28 ENCOUNTER — Other Ambulatory Visit: Payer: Self-pay

## 2021-07-28 ENCOUNTER — Ambulatory Visit (AMBULATORY_SURGERY_CENTER): Payer: Medicare Other | Admitting: Gastroenterology

## 2021-07-28 ENCOUNTER — Encounter: Payer: Self-pay | Admitting: Gastroenterology

## 2021-07-28 VITALS — BP 99/54 | HR 73 | Temp 97.1°F | Resp 24 | Ht 72.0 in | Wt 222.0 lb

## 2021-07-28 DIAGNOSIS — K621 Rectal polyp: Secondary | ICD-10-CM

## 2021-07-28 DIAGNOSIS — Z1211 Encounter for screening for malignant neoplasm of colon: Secondary | ICD-10-CM

## 2021-07-28 DIAGNOSIS — D128 Benign neoplasm of rectum: Secondary | ICD-10-CM

## 2021-07-28 MED ORDER — SODIUM CHLORIDE 0.9 % IV SOLN
500.0000 mL | Freq: Once | INTRAVENOUS | Status: DC
Start: 2021-07-28 — End: 2021-07-28

## 2021-07-28 NOTE — Op Note (Signed)
[facility] Patient Name: Isaiah Wu Procedure Date: 07/28/2021 11:15 AM MRN: [id number] Endoscopist: Justice Britain , MD Age: 70 Referring MD:  Date of Birth: [birth year] Gender: Male Account #: [id number] Procedure:                Colonoscopy Indications:              Screening for colorectal malignant neoplasm Medicines:                Monitored Anesthesia Care Procedure:                Pre-Anesthesia Assessment:                           - Prior to the procedure, a History and Physical                            was performed, and patient medications and                            allergies were reviewed. The patient's tolerance of                            previous anesthesia was also reviewed. The risks                            and benefits of the procedure and the sedation                            options and risks were discussed with the patient.                            All questions were answered, and informed consent                            was obtained. Prior Anticoagulants: The patient has                            taken no previous anticoagulant or antiplatelet                            agents. ASA Grade Assessment: III - A patient with                            severe systemic disease. After reviewing the risks                            and benefits, the patient was deemed in                            satisfactory condition to undergo the procedure.                           After obtaining informed consent, the colonoscope  was passed under direct vision. Throughout the                            procedure, the patient's blood pressure, pulse, and                            oxygen saturations were monitored continuously. The                            Olympus CF-HQ190L Colonoscope was introduced                            through the anus and advanced to the the cecum,                            identified by  appendiceal orifice and ileocecal                            valve. The colonoscopy was somewhat difficult due                            to a redundant colon. Successful completion of the                            procedure was aided by changing the patient's                            position, using manual pressure, withdrawing and                            reinserting the scope, straightening and shortening                            the scope to obtain bowel loop reduction and using                            scope torsion. The patient tolerated the procedure.                            The quality of the bowel preparation was adequate.                            The ileocecal valve, appendiceal orifice, and                            rectum were photographed. Scope In: 11:26:56 AM Scope Out: 11:42:08 AM Scope Withdrawal Time: 0 hours 10 minutes 50 seconds  Total Procedure Duration: 0 hours 15 minutes 12 seconds  Findings:                 The digital rectal exam findings include                            hemorrhoids. Pertinent negatives include no  palpable rectal lesions.                           The colon (entire examined portion) was grossly                            redundant.                           A moderate amount of semi-liquid stool was found in                            the entire colon, interfering with visualization.                            Lavage of the area was performed using copious                            amounts, resulting in clearance with adequate                            visualization.                           A 4 mm polyp was found in the rectum. The polyp was                            sessile. The polyp was removed with a cold snare.                            Resection and retrieval were complete.                           Normal mucosa was found in the entire colon                            otherwise.                            Anal papilla was hypertrophied.                           Non-bleeding non-thrombosed external and internal                            hemorrhoids were found during retroflexion, during                            perianal exam and during digital exam. The                            hemorrhoids were Grade II (internal hemorrhoids                            that prolapse but reduce spontaneously). Complications:  No immediate complications. Estimated Blood Loss:     Estimated blood loss was minimal. Impression:               - Hemorrhoids found on digital rectal exam.                           - Significantly redundant colon.                           - Stool in the entire examined colon.                           - One 4 mm polyp in the rectum, removed with a cold                            snare. Resected and retrieved.                           - Normal mucosa in the entire examined colon                            otherwise.                           - Anal papilla was hypertrophied.                           - Non-bleeding non-thrombosed external and internal                            hemorrhoids. Recommendation:           - The patient will be observed post-procedure,                            until all discharge criteria are met.                           - Discharge patient to home.                           - Patient has a contact number available for                            emergencies. The signs and symptoms of potential                            delayed complications were discussed with the                            patient. Return to normal activities tomorrow.                            Written discharge instructions were provided to the                            patient.                           -  High fiber diet.                           - Use FiberCon 1-2 tablets PO daily.                           - Continue present medications.                            - Await pathology results.                           - Repeat colonoscopy in 04/25/09 years for                            surveillance based on pathology results.                           - The findings and recommendations were discussed                            with the patient.                           - The findings and recommendations were discussed                            with the patient's family. Justice Britain, MD 07/28/2021 11:47:25 AM

## 2021-07-28 NOTE — Progress Notes (Signed)
A and O x3. Report to RN. Tolerated MAC anesthesia well. 

## 2021-07-28 NOTE — Progress Notes (Signed)
Medical history reviewed with no changes noted. VS assessed by A.S 

## 2021-07-28 NOTE — Progress Notes (Signed)
GASTROENTEROLOGY PROCEDURE H&P NOTE   Primary Care Physician: Lauree Chandler, NP  HPI: Isaiah Wu is a 70 y.o. male who presents for colonoscopy for screening.  Past Medical History:  Diagnosis Date   Actinic keratoses    Per Records from [facility]    Cardiomyopathy ([place]) 0000000   Chronic systolic CHF (congestive heart failure) ([place]) 04/14/2015   a. s/p STJ CRTD b. EF normalized with CRT therapy   Colon polyp    Per Records from [facility]    Elevated PSA    Followed by Dr. 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   placed by Dr.McLean, Per Records from [facility]    LBBB (left bundle branch block)    Non-ischemic cardiomyopathy ([place])    Per Records from [place] of breath    Per Records from [place]    Syncope 05/30/2015   Past Surgical History:  Procedure Laterality Date   CARDIAC CATHETERIZATION     COLONOSCOPY  08/30/2005   Per Records from [place] N/A 07/21/2015   STJ CRTD implanted by Dr Caryl Comes for NICM, CHF   LEFT AND RIGHT HEART CATHETERIZATION WITH CORONARY ANGIOGRAM N/A 04/09/2015   Procedure: LEFT AND RIGHT HEART CATHETERIZATION WITH CORONARY ANGIOGRAM;  Surgeon: Jacolyn Reedy, MD;  Location: [facility] CATH LAB;  Service: Cardiovascular;  Laterality: N/A;   ORIF ELBOW FRACTURE Left 1981   SHOULDER SURGERY Left 05/2008   "had to put cadavear bone in and reattach muscle to it"   TONSILLECTOMY AND ADENOIDECTOMY  1960's    Current Outpatient Medications  Medication Sig Dispense Refill   carvedilol (COREG) 12.5 MG tablet TAKE 1 TABLET (12.5 MG TOTAL) BY MOUTH 2 (TWO) TIMES DAILY WITH A MEAL. 180 tablet 3   furosemide (LASIX) 20 MG tablet TAKE 1 TABLET BY MOUTH EVERY DAY AS NEEDED 90 tablet 2   Multiple Vitamins-Minerals (CENTRUM SILVER 50+MEN) TABS Take by mouth.     sacubitril-valsartan (ENTRESTO) 49-51 MG Take 1 tablet by mouth 2 (two) times daily. 60 tablet 6   spironolactone (ALDACTONE) 25 MG tablet TAKE 1 TABLET BY MOUTH EVERY DAY 90 tablet 3   Current Facility-Administered Medications  Medication Dose Route Frequency Provider Last Rate Last Admin   0.9 %  sodium chloride infusion  500 mL Intravenous Once Mansouraty, Telford Nab., MD        Current Outpatient Medications:    carvedilol (COREG) 12.5 MG tablet, TAKE 1 TABLET (12.5 MG TOTAL) BY MOUTH 2 (TWO) TIMES DAILY WITH A MEAL., Disp: 180 tablet, Rfl: 3   furosemide (LASIX) 20 MG tablet, TAKE 1 TABLET BY MOUTH EVERY DAY AS NEEDED, Disp: 90 tablet, Rfl: 2   Multiple Vitamins-Minerals (CENTRUM SILVER 50+MEN) TABS, Take by mouth., Disp: , Rfl:    sacubitril-valsartan (ENTRESTO) 49-51 MG, Take 1 tablet by mouth 2 (two) times daily., Disp: 60 tablet, Rfl: 6  spironolactone (ALDACTONE) 25 MG tablet, TAKE 1 TABLET BY MOUTH EVERY DAY, Disp: 90 tablet, Rfl: 3  Current Facility-Administered Medications:    0.9 %  sodium chloride infusion, 500 mL, Intravenous, Once, Mansouraty, Telford Nab., MD No Known Allergies Family History  Problem Relation Age of Onset   CVA Mother    High Cholesterol Mother        Per Records from [place] Mother        Per Records from [place]    High blood pressure Mother        Per Records from [place] cancer Father    Diabetes Father    Colon polyps Father        Precamcerous, Per Records from [place]    High blood pressure Sister    Colon cancer Neg Hx    Esophageal  cancer Neg Hx    Rectal cancer Neg Hx    Stomach cancer Neg Hx    Social History   Socioeconomic History   Marital status: Married    Spouse name: Not on file   Number of children: Not on file   Years of education: Not on file   Highest education level: Not on file  Occupational History   Not on file  Tobacco Use   Smoking status: Former    Packs/day: 1.00    Years: 2.00    Pack years: 2.00    Types: Cigarettes    Start date: 12/20/1970    Quit date: 12/20/1977    Years since quitting: 43.6   Smokeless tobacco: Never   Tobacco comments:    'quit smoking in the 1970's"  Vaping Use   Vaping Use: Never used  Substance and Sexual Activity   Alcohol use: Yes    Alcohol/week: 14.0 standard drinks    Types: 14 Glasses of wine per week   Drug use: No   Sexual activity: Yes  Other Topics Concern   Not on file  Social History Narrative   Diet No      Do you drink/eat things with caffeine Yes, 2 cups of coffee daily      Marital Status Yes What year were you married? 1976      Do you live in a house, apartment, assisted living, condo, trailer, etc.? House      Is it one or more stories? Yes      How many persons live in your home? 2         Do you have any pets in your home?(please list) No      Highest level of education completed: Associate      Current or past profession:Supply chain executive-Now volunteering      Do you exercise?: Yes  Type and how often: Ellipitical cycling 5 days a week      Do you have a Living Will? No      Do you have a DNR form?  No       If not, would you like to discuss one? Yes      Do you have signed POA/HPOA forms? No      Do you have difficulty bathing or dressing yourself? No      Do you have difficulty preparing food or eating? No      Do you have difficulty managing medications? No      Do you have difficulty managing your finances? No  Do you have difficulty affording your medications? No                  Social  Determinants of Radio broadcast assistant Strain: Not on file  Food Insecurity: Not on file  Transportation Needs: Not on file  Physical Activity: Not on file  Stress: Not on file  Social Connections: Not on file  Intimate Partner Violence: Not on file    Physical Exam: Vital signs in last 24 hours: '@VSRANGES'$ @   GEN: NAD EYE: Sclerae anicteric ENT: MMM CV: Non-tachycardic GI: Soft, NT/ND NEURO:  Alert & Oriented x 3  Lab Results: No results for input(s): WBC, HGB, HCT, PLT in the last 72 hours. BMET No results for input(s): NA, K, CL, CO2, GLUCOSE, BUN, CREATININE, CALCIUM in the last 72 hours. LFT No results for input(s): PROT, ALBUMIN, AST, ALT, ALKPHOS, BILITOT, BILIDIR, IBILI in the last 72 hours. PT/INR No results for input(s): LABPROT, INR in the last 72 hours.   Impression / Plan: This is a 70 y.o.male who presents for Colonoscopy for screening.  The risks and benefits of endoscopic evaluation were discussed with the patient; these include but are not limited to the risk of perforation, infection, bleeding, missed lesions, lack of diagnosis, severe illness requiring hospitalization, as well as anesthesia and sedation related illnesses.  The patient is agreeable to proceed.    Justice Britain, MD [facility] Office # [phone]

## 2021-07-28 NOTE — Patient Instructions (Signed)
YOU HAD AN ENDOSCOPIC PROCEDURE TODAY AT [facility]:   Refer to the procedure report that was given to you for any specific questions about what was found during the examination.  If the procedure report does not answer your questions, please call your gastroenterologist to clarify.  If you requested that your care partner not be given the details of your procedure findings, then the procedure report has been included in a sealed envelope for you to review at your convenience later.  YOU SHOULD EXPECT: Some feelings of bloating in the abdomen. Passage of more gas than usual.  Walking can help get rid of the air that was put into your GI tract during the procedure and reduce the bloating. If you had a lower endoscopy (such as a colonoscopy or flexible sigmoidoscopy) you may notice spotting of blood in your stool or on the toilet paper. If you underwent a bowel prep for your procedure, you may not have a normal bowel movement for a few days.  Please Note:  You might notice some irritation and congestion in your nose or some drainage.  This is from the oxygen used during your procedure.  There is no need for concern and it should clear up in a day or so.  SYMPTOMS TO REPORT IMMEDIATELY:  Following lower endoscopy (colonoscopy or flexible sigmoidoscopy):  Excessive amounts of blood in the stool  Significant tenderness or worsening of abdominal pains  Swelling of the abdomen that is new, acute  Fever of 100F or higher    For urgent or emergent issues, a gastroenterologist can be reached at any hour by calling [phone]. Do not use MyChart messaging for urgent concerns.    DIET:  We do recommend a small meal at first, but then you may proceed to your regular diet.  Drink plenty of fluids but you should avoid alcoholic beverages for 24 hours.  ACTIVITY:  You should plan to take it easy for the rest of today and you should NOT DRIVE or use heavy machinery until tomorrow (because  of the sedation medicines used during the test).    FOLLOW UP: Our staff will call the number listed on your records 48-72 hours following your procedure to check on you and address any questions or concerns that you may have regarding the information given to you following your procedure. If we do not reach you, we will leave a message.  We will attempt to reach you two times.  During this call, we will ask if you have developed any symptoms of COVID 19. If you develop any symptoms (ie: fever, flu-like symptoms, shortness of breath, cough etc.) before then, please call [phone].  If you test positive for Covid 19 in the 2 weeks post procedure, please call and report this information to [place].    If any biopsies were taken you will be contacted by phone or by letter within the next 1-3 weeks.  Please call us at [phone] if you have not heard about the biopsies in 3 weeks.    SIGNATURES/CONFIDENTIALITY: You and/or your care partner have signed paperwork which will be entered into your electronic medical record.  These signatures attest to the fact that that the information above on your After Visit Summary has been reviewed and is understood.  Full responsibility of the confidentiality of this discharge information lies with you and/or your care-partner.    Resume medications. Information given on polyps,hemorrhoids and high fiber diet. See procedure report for over  the counter fiber recommendations.

## 2021-07-28 NOTE — Progress Notes (Signed)
Pt's states no medical or surgical changes since previsit or office visit. 

## 2021-07-30 ENCOUNTER — Telehealth: Payer: Self-pay

## 2021-07-30 NOTE — Telephone Encounter (Signed)
  Follow up Call-  Call back number 07/28/2021  Post procedure Call Back phone  # [phone]  Permission to leave phone message Yes  Some recent data might be hidden     Patient questions:  Do you have a fever, pain , or abdominal swelling? No. Pain Score  0 *  Have you tolerated food without any problems? Yes.    Have you been able to return to your normal activities? Yes.    Do you have any questions about your discharge instructions: Diet   No. Medications  No. Follow up visit  No.  Do you have questions or concerns about your Care? No.  Actions: * If pain score is 4 or above: No action needed, pain <4. Have you developed a fever since your procedure? no  2.   Have you had an respiratory symptoms (SOB or cough) since your procedure? no  3.   Have you tested positive for COVID 19 since your procedure no  4.   Have you had any family members/close contacts diagnosed with the COVID 19 since your procedure?  no   If yes to any of these questions please route to Joylene John, RN and Joella Prince, RN

## 2021-07-31 ENCOUNTER — Telehealth (HOSPITAL_COMMUNITY): Payer: Self-pay | Admitting: Surgery

## 2021-07-31 NOTE — Telephone Encounter (Signed)
I called patient to remind him to perform the ordered home sleep study.  I left a message to indicate that insurance auth not needed and is fine to proceed.

## 2021-08-04 ENCOUNTER — Encounter: Payer: Self-pay | Admitting: Gastroenterology

## 2021-08-05 ENCOUNTER — Encounter: Payer: Self-pay | Admitting: Nurse Practitioner

## 2021-08-05 ENCOUNTER — Telehealth: Payer: Self-pay

## 2021-08-05 NOTE — Telephone Encounter (Signed)
FYI Dr Rush Landmark the pt has COVID symptoms and wanted to make you aware since he had recent procedure. (Colonoscopy 07/28/21)  He will be calling his PCP.

## 2021-08-05 NOTE — Telephone Encounter (Signed)
Message has been sent to the pt to update [place] after he is tested for COVID

## 2021-08-05 NOTE — Telephone Encounter (Signed)
Thank you for update. Hope he feels better soon. Can you have him give [place] an update after he gets tested with the results so we can have our team aware? Thanks. GM

## 2021-09-03 ENCOUNTER — Other Ambulatory Visit (HOSPITAL_COMMUNITY): Payer: Self-pay | Admitting: Cardiology

## 2021-09-14 ENCOUNTER — Ambulatory Visit (INDEPENDENT_AMBULATORY_CARE_PROVIDER_SITE_OTHER): Payer: Medicare Other | Admitting: Nurse Practitioner

## 2021-09-14 ENCOUNTER — Encounter: Payer: Self-pay | Admitting: Nurse Practitioner

## 2021-09-14 ENCOUNTER — Other Ambulatory Visit: Payer: Self-pay

## 2021-09-14 VITALS — BP 122/74 | HR 77 | Temp 97.8°F | Ht 72.0 in | Wt 227.0 lb

## 2021-09-14 DIAGNOSIS — I10 Essential (primary) hypertension: Secondary | ICD-10-CM

## 2021-09-14 DIAGNOSIS — I428 Other cardiomyopathies: Secondary | ICD-10-CM

## 2021-09-14 DIAGNOSIS — E6609 Other obesity due to excess calories: Secondary | ICD-10-CM | POA: Insufficient documentation

## 2021-09-14 DIAGNOSIS — I5022 Chronic systolic (congestive) heart failure: Secondary | ICD-10-CM

## 2021-09-14 DIAGNOSIS — J302 Other seasonal allergic rhinitis: Secondary | ICD-10-CM | POA: Diagnosis not present

## 2021-09-14 DIAGNOSIS — Z6830 Body mass index (BMI) 30.0-30.9, adult: Secondary | ICD-10-CM

## 2021-09-14 LAB — CBC WITH DIFFERENTIAL/PLATELET
Absolute Monocytes: 745 cells/uL (ref 200–950)
Basophils Absolute: 69 cells/uL (ref 0–200)
Basophils Relative: 1 %
Eosinophils Absolute: 359 cells/uL (ref 15–500)
Eosinophils Relative: 5.2 %
HCT: 46.7 % (ref 38.5–50.0)
Hemoglobin: 15.5 g/dL (ref 13.2–17.1)
Lymphs Abs: 2111 cells/uL (ref 850–3900)
MCH: 29.7 pg (ref 27.0–33.0)
MCHC: 33.2 g/dL (ref 32.0–36.0)
MCV: 89.5 fL (ref 80.0–100.0)
MPV: 10.3 fL (ref 7.5–12.5)
Monocytes Relative: 10.8 %
Neutro Abs: 3616 cells/uL (ref 1500–7800)
Neutrophils Relative %: 52.4 %
Platelets: 331 10*3/uL (ref 140–400)
RBC: 5.22 10*6/uL (ref 4.20–5.80)
RDW: 13.8 % (ref 11.0–15.0)
Total Lymphocyte: 30.6 %
WBC: 6.9 10*3/uL (ref 3.8–10.8)

## 2021-09-14 LAB — HEPATIC FUNCTION PANEL
AG Ratio: 1.7 (calc) (ref 1.0–2.5)
ALT: 26 U/L (ref 9–46)
AST: 18 U/L (ref 10–35)
Albumin: 4.4 g/dL (ref 3.6–5.1)
Alkaline phosphatase (APISO): 58 U/L (ref 35–144)
Bilirubin, Direct: 0.1 mg/dL (ref 0.0–0.2)
Globulin: 2.6 g/dL (calc) (ref 1.9–3.7)
Indirect Bilirubin: 0.5 mg/dL (calc) (ref 0.2–1.2)
Total Bilirubin: 0.6 mg/dL (ref 0.2–1.2)
Total Protein: 7 g/dL (ref 6.1–8.1)

## 2021-09-14 NOTE — Patient Instructions (Addendum)
To get shingles vaccine at your local pharmacy.   

## 2021-09-14 NOTE — Progress Notes (Signed)
Careteam: Patient Care Team: Lauree Chandler, NP as PCP - General (Geriatric Medicine) Jacolyn Reedy, MD as Consulting Physician (Cardiology) Larey Dresser, MD as Consulting Physician (Cardiology) Deboraha Sprang, MD as Consulting Physician (Cardiology)  PLACE OF SERVICE:  [place] Directive information Does Patient Have a Medical Advance Directive?: No, Would patient like information on creating a medical advance directive?: Yes (MAU/Ambulatory/Procedural Areas - Information given) (Paperwork given at previous visit)  No Known Allergies  Chief Complaint  Patient presents with   Medical Management of Chronic Issues    6 month follow-up. Discuss need for shingrix and covid or exclude. Patient c/o decreased energy since colonoscopy.      HPI: Patient is a 70 y.o. male for routine follow up.   Went and had colonoscopy  Had URI- COVID negative but had fever and chills and fatigue. Now he is trying to get energy back up.   Following with Dr Caryl Comes for pacer checks and [facility] for CHF.  Goal for 10K steps a day.   Gotten down to 214-215 lbs eating more protein and veggies but then after colonoscopy "feel apart" and has gained some weight. Working towards getting back to goal.    Review of Systems:  Review of Systems  Constitutional:  Negative for chills, fever and weight loss.  HENT:  Negative for tinnitus.   Respiratory:  Negative for cough, sputum production and shortness of breath.   Cardiovascular:  Negative for chest pain, palpitations and leg swelling.  Gastrointestinal:  Negative for abdominal pain, constipation, diarrhea and heartburn.  Genitourinary:  Negative for dysuria, frequency and urgency.  Musculoskeletal:  Negative for back pain, falls, joint pain and myalgias.  Skin: Negative.   Neurological:  Negative for dizziness and headaches.  Psychiatric/Behavioral:  Negative for depression and memory loss. The patient does not have insomnia.     Past Medical History:  Diagnosis Date   Actinic keratoses    Per Records from [facility]    Cardiomyopathy ([place]) 02/02/864   Chronic systolic CHF (congestive heart failure) ([place]) 04/14/2015   a. s/p STJ CRTD b. EF normalized with CRT therapy   Colon polyp    Per Records from [facility]    Elevated PSA    Followed by Dr. 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   placed by Dr.McLean, Per Records from [facility]    LBBB (left bundle branch block)    Non-ischemic cardiomyopathy ([place])    Per Records from [facility] of breath    Per Records from [facility]    Syncope 05/30/2015   Past Surgical History:  Procedure Laterality Date   CARDIAC CATHETERIZATION     COLONOSCOPY  08/30/2005   Per Records from [facility] N/A 07/21/2015   STJ CRTD implanted by Dr Caryl Comes for NICM, CHF   LEFT AND RIGHT HEART CATHETERIZATION WITH CORONARY  ANGIOGRAM N/A 04/09/2015   Procedure: LEFT AND RIGHT HEART CATHETERIZATION WITH CORONARY ANGIOGRAM;  Surgeon: Jacolyn Reedy, MD;  Location: [facility] CATH LAB;  Service: Cardiovascular;  Laterality: N/A;   ORIF ELBOW FRACTURE Left 1981   SHOULDER SURGERY Left 05/2008   "had to put cadavear bone in and reattach muscle to it"   TONSILLECTOMY AND ADENOIDECTOMY  1960's   Social History:   reports that he quit smoking about 47 years ago. His smoking use included cigarettes. He started smoking about 50 years ago. He has a 2.00 pack-year smoking  history. He has never used smokeless tobacco. He reports current alcohol use of about 14.0 standard drinks per week. He reports that he does not use drugs.  Family History  Problem Relation Age of Onset   CVA Mother    High Cholesterol Mother        Per Records from [place] Mother        Per Records from [place]    High blood pressure Mother        Per Records from [place] cancer Father    Diabetes Father    Colon polyps Father        Precamcerous, Per Records from [place]    High blood pressure Sister    Colon cancer Neg Hx    Esophageal cancer Neg Hx    Rectal cancer Neg Hx    Stomach cancer Neg Hx     Medications: Patient's Medications  New Prescriptions   No medications on file  Previous Medications   CARVEDILOL (COREG) 12.5 MG TABLET    TAKE 1 TABLET BY MOUTH 2 (TWO) TIMES DAILY WITH A MEAL.   FUROSEMIDE (LASIX) 20 MG TABLET    TAKE 1 TABLET BY MOUTH EVERY DAY AS NEEDED   LORATADINE (CLARITIN) 10 MG TABLET    Take 10 mg by mouth daily as needed for allergies (Seasonal).   MULTIPLE VITAMINS-MINERALS (CENTRUM SILVER 50+MEN) TABS    Take by mouth.   SACUBITRIL-VALSARTAN (ENTRESTO) 49-51 MG    Take 1 tablet by mouth 2 (two) times daily.   SPIRONOLACTONE (ALDACTONE) 25 MG TABLET    TAKE 1 TABLET BY MOUTH EVERY DAY  Modified Medications   No medications on file  Discontinued Medications   No medications on file    Physical Exam:  Vitals:   09/14/21 0830  BP: 122/74  Pulse: 77  Temp: 97.8 F (36.6 C)  TempSrc: Temporal  SpO2: 97%  Weight: 227 lb (103 kg)  Height: 6' (1.829 m)   Body mass index is 30.79 kg/m. Wt Readings from Last 3 Encounters:  09/14/21 227 lb (103 kg)  07/28/21 222 lb (100.7 kg)  07/16/21 222 lb (100.7 kg)    Physical Exam Constitutional:      General: He is not in acute distress.    Appearance: He is well-developed. He is not diaphoretic.  HENT:     Head: Normocephalic and  atraumatic.     Right Ear: External ear normal.     Left Ear: External ear normal.     Mouth/Throat:     Pharynx: No oropharyngeal exudate.  Eyes:     Conjunctiva/sclera: Conjunctivae normal.     Pupils: Pupils are equal, round, and reactive to light.  Cardiovascular:     Rate and Rhythm: Normal rate and regular rhythm.     Heart sounds: Normal heart sounds.  Pulmonary:     Effort:  Pulmonary effort is normal.     Breath sounds: Normal breath sounds.  Abdominal:     General: Bowel sounds are normal.     Palpations: Abdomen is soft.  Musculoskeletal:        General: No tenderness.     Cervical back: Normal range of motion and neck supple.     Right lower leg: No edema.     Left lower leg: No edema.  Skin:    General: Skin is warm and dry.  Neurological:     Mental Status: He is alert and oriented to person, place, and time.    Labs reviewed: Basic Metabolic Panel: Recent Labs    10/13/20 0816 01/14/21 0856 07/14/21 1435  NA 138 141 140  K 4.1 4.2 3.9  CL 106 106 106  CO2 23 26 25   GLUCOSE 100* 108* 90  BUN 16 15 13   CREATININE 1.16 1.08 1.10  CALCIUM 9.7 9.7 9.6   Liver Function Tests: Recent Labs    09/15/20 0956  AST 22  ALT 31  BILITOT 0.7  PROT 7.5   No results for input(s): LIPASE, AMYLASE in the last 8760 hours. No results for input(s): AMMONIA in the last 8760 hours. CBC: Recent Labs    09/15/20 0956  WBC 8.3  NEUTROABS 4,988  HGB 16.8  HCT 49.2  MCV 90.8  PLT 367   Lipid Panel: Recent Labs    09/15/20 0956 07/14/21 1435  CHOL 155 154  HDL 46 50  LDLCALC 86 86  TRIG 133 90  CHOLHDL 3.4 3.1   TSH: No results for input(s): TSH in the last 8760 hours. A1C: No results found for: HGBA1C   Assessment/Plan 1. Chronic systolic CHF (congestive heart failure) (HCC) -symptoms stable, continues on coreg, lasix, spironolactone and entresto. No shortness of breath or chest pains.   2. Essential hypertension -stable. Goal bp <140/90.  Continue on current regimen with low sodium diet.   3. NICM (nonischemic cardiomyopathy) ([place]) Stable, continues with routine follow up with cardiology.   4. Seasonal allergies Stable on Claritin.   5. Class 1 obesity due to excess calories with serious comorbidity and body mass index (BMI) of 30.0 to 30.9 in adult -education provided on healthy weight loss through increase in physical activity and proper nutrition     Next appt: 6 month follow up [place]. [place], [facility] [phone]

## 2021-09-30 ENCOUNTER — Ambulatory Visit (INDEPENDENT_AMBULATORY_CARE_PROVIDER_SITE_OTHER): Payer: Medicare Other

## 2021-09-30 DIAGNOSIS — I428 Other cardiomyopathies: Secondary | ICD-10-CM | POA: Diagnosis not present

## 2021-10-01 LAB — CUP PACEART REMOTE DEVICE CHECK
Battery Remaining Longevity: 11 mo
Battery Remaining Percentage: 18 %
Battery Voltage: 2.75 V
Brady Statistic AP VP Percent: 27 %
Brady Statistic AP VS Percent: 1 %
Brady Statistic AS VP Percent: 73 %
Brady Statistic AS VS Percent: 1 %
Brady Statistic RA Percent Paced: 27 %
Date Time Interrogation Session: 20221012141944
HighPow Impedance: 77 Ohm
HighPow Impedance: 77 Ohm
Implantable Lead Implant Date: 20160801
Implantable Lead Implant Date: 20160801
Implantable Lead Implant Date: 20160801
Implantable Lead Location: 753858
Implantable Lead Location: 753859
Implantable Lead Location: 753860
Implantable Lead Model: 7122
Implantable Pulse Generator Implant Date: 20160801
Lead Channel Impedance Value: 390 Ohm
Lead Channel Impedance Value: 450 Ohm
Lead Channel Impedance Value: 910 Ohm
Lead Channel Pacing Threshold Amplitude: 0.75 V
Lead Channel Pacing Threshold Amplitude: 1 V
Lead Channel Pacing Threshold Amplitude: 2 V
Lead Channel Pacing Threshold Pulse Width: 0.5 ms
Lead Channel Pacing Threshold Pulse Width: 0.5 ms
Lead Channel Pacing Threshold Pulse Width: 0.7 ms
Lead Channel Sensing Intrinsic Amplitude: 11.7 mV
Lead Channel Sensing Intrinsic Amplitude: 3.4 mV
Lead Channel Setting Pacing Amplitude: 1.75 V
Lead Channel Setting Pacing Amplitude: 2.5 V
Lead Channel Setting Pacing Amplitude: 3 V
Lead Channel Setting Pacing Pulse Width: 0.5 ms
Lead Channel Setting Pacing Pulse Width: 0.5 ms
Lead Channel Setting Sensing Sensitivity: 0.5 mV
Pulse Gen Serial Number: 7290135

## 2021-10-08 NOTE — Progress Notes (Signed)
Remote ICD transmission.   

## 2021-10-19 ENCOUNTER — Other Ambulatory Visit: Payer: Self-pay

## 2021-10-19 ENCOUNTER — Encounter: Payer: Self-pay | Admitting: Nurse Practitioner

## 2021-10-19 ENCOUNTER — Telehealth: Payer: Self-pay

## 2021-10-19 ENCOUNTER — Ambulatory Visit (INDEPENDENT_AMBULATORY_CARE_PROVIDER_SITE_OTHER): Payer: Medicare Other | Admitting: Nurse Practitioner

## 2021-10-19 DIAGNOSIS — Z0000 Encounter for general adult medical examination without abnormal findings: Secondary | ICD-10-CM

## 2021-10-19 NOTE — Telephone Encounter (Signed)
Mr. Isaiah Wu, Isaiah Wu are scheduled for a virtual visit with your provider today.    Just as we do with appointments in the office, we must obtain your consent to participate.  Your consent will be active for this visit and any virtual visit you may have with one of our providers in the next 365 days.    If you have a MyChart account, I can also send a copy of this consent to you electronically.  All virtual visits are billed to your insurance company just like a traditional visit in the office.  As this is a virtual visit, video technology does not allow for your provider to perform a traditional examination.  This may limit your provider's ability to fully assess your condition.  If your provider identifies any concerns that need to be evaluated in person or the need to arrange testing such as labs, EKG, etc, we will make arrangements to do so.    Although advances in technology are sophisticated, we cannot ensure that it will always work on either your end or our end.  If the connection with a video visit is poor, we may have to switch to a telephone visit.  With either a video or telephone visit, we are not always able to ensure that we have a secure connection.   I need to obtain your verbal consent now.   Are you willing to proceed with your visit today?   Golden Gilreath has provided verbal consent on 10/19/2021 for a virtual visit (video or telephone).   [facility] [place], [place] 10/19/2021  8:40 AM

## 2021-10-19 NOTE — Patient Instructions (Addendum)
Isaiah Wu , Thank you for taking time to come for your Medicare Wellness Visit. I appreciate your ongoing commitment to your health goals. Please review the following plan we discussed and let me know if I can assist you in the future.   Screening recommendations/referrals: Colonoscopy up to date Recommended yearly ophthalmology/optometry visit for glaucoma screening and checkup Recommended yearly dental visit for hygiene and checkup  Vaccinations: Influenza vaccine up to date Pneumococcal vaccine DUE for prevnar 15  Tdap vaccine up to date Shingles vaccine due for 2nd vaccine    Advanced directives: recommended to complete   Conditions/risks identified: advance age, obesity, heart disease.   Next appointment: yearly   Preventive Care 29 Years and Older, Male Preventive care refers to lifestyle choices and visits with your health care provider that can promote health and wellness. What does preventive care include? A yearly physical exam. This is also called an annual well check. Dental exams once or twice a year. Routine eye exams. Ask your health care provider how often you should have your eyes checked. Personal lifestyle choices, including: Daily care of your teeth and gums. Regular physical activity. Eating a healthy diet. Avoiding tobacco and drug use. Limiting alcohol use. Practicing safe sex. Taking low doses of aspirin every day. Taking vitamin and mineral supplements as recommended by your health care provider. What happens during an annual well check? The services and screenings done by your health care provider during your annual well check will depend on your age, overall health, lifestyle risk factors, and family history of disease. Counseling  Your health care provider may ask you questions about your: Alcohol use. Tobacco use. Drug use. Emotional well-being. Home and relationship well-being. Sexual activity. Eating habits. History of falls. Memory and  ability to understand (cognition). Work and work Statistician. Screening  You may have the following tests or measurements: Height, weight, and BMI. Blood pressure. Lipid and cholesterol levels. These may be checked every 5 years, or more frequently if you are over 41 years old. Skin check. Lung cancer screening. You may have this screening every year starting at age 27 if you have a 30-pack-year history of smoking and currently smoke or have quit within the past 15 years. Fecal occult blood test (FOBT) of the stool. You may have this test every year starting at age 18. Flexible sigmoidoscopy or colonoscopy. You may have a sigmoidoscopy every 5 years or a colonoscopy every 10 years starting at age 24. Prostate cancer screening. Recommendations will vary depending on your family history and other risks. Hepatitis C blood test. Hepatitis B blood test. Sexually transmitted disease (STD) testing. Diabetes screening. This is done by checking your blood sugar (glucose) after you have not eaten for a while (fasting). You may have this done every 1-3 years. Abdominal aortic aneurysm (AAA) screening. You may need this if you are a current or former smoker. Osteoporosis. You may be screened starting at age 83 if you are at high risk. Talk with your health care provider about your test results, treatment options, and if necessary, the need for more tests. Vaccines  Your health care provider may recommend certain vaccines, such as: Influenza vaccine. This is recommended every year. Tetanus, diphtheria, and acellular pertussis (Tdap, Td) vaccine. You may need a Td booster every 10 years. Zoster vaccine. You may need this after age 45. Pneumococcal 13-valent conjugate (PCV13) vaccine. One dose is recommended after age 64. Pneumococcal polysaccharide (PPSV23) vaccine. One dose is recommended after age 93. Talk to  your health care provider about which screenings and vaccines you need and how often you need  them. This information is not intended to replace advice given to you by your health care provider. Make sure you discuss any questions you have with your health care provider. Document Released: 01/02/2016 Document Revised: 08/25/2016 Document Reviewed: 10/07/2015 Elsevier Interactive Patient Education  2017 [place] Prevention in the Home Falls can cause injuries. They can happen to people of all ages. There are many things you can do to make your home safe and to help prevent falls. What can I do on the outside of my home? Regularly fix the edges of walkways and driveways and fix any cracks. Remove anything that might make you trip as you walk through a door, such as a raised step or threshold. Trim any bushes or trees on the path to your home. Use bright outdoor lighting. Clear any walking paths of anything that might make someone trip, such as rocks or tools. Regularly check to see if handrails are loose or broken. Make sure that both sides of any steps have handrails. Any raised decks and porches should have guardrails on the edges. Have any leaves, snow, or ice cleared regularly. Use sand or salt on walking paths during winter. Clean up any spills in your garage right away. This includes oil or grease spills. What can I do in the bathroom? Use night lights. Install grab bars by the toilet and in the tub and shower. Do not use towel bars as grab bars. Use non-skid mats or decals in the tub or shower. If you need to sit down in the shower, use a plastic, non-slip stool. Keep the floor dry. Clean up any water that spills on the floor as soon as it happens. Remove soap buildup in the tub or shower regularly. Attach bath mats securely with double-sided non-slip rug tape. Do not have throw rugs and other things on the floor that can make you trip. What can I do in the bedroom? Use night lights. Make sure that you have a light by your bed that is easy to reach. Do not use  any sheets or blankets that are too big for your bed. They should not hang down onto the floor. Have a firm chair that has side arms. You can use this for support while you get dressed. Do not have throw rugs and other things on the floor that can make you trip. What can I do in the kitchen? Clean up any spills right away. Avoid walking on wet floors. Keep items that you use a lot in easy-to-reach places. If you need to reach something above you, use a strong step stool that has a grab bar. Keep electrical cords out of the way. Do not use floor polish or wax that makes floors slippery. If you must use wax, use non-skid floor wax. Do not have throw rugs and other things on the floor that can make you trip. What can I do with my stairs? Do not leave any items on the stairs. Make sure that there are handrails on both sides of the stairs and use them. Fix handrails that are broken or loose. Make sure that handrails are as long as the stairways. Check any carpeting to make sure that it is firmly attached to the stairs. Fix any carpet that is loose or worn. Avoid having throw rugs at the top or bottom of the stairs. If you do have throw rugs, attach  them to the floor with carpet tape. Make sure that you have a light switch at the top of the stairs and the bottom of the stairs. If you do not have them, ask someone to add them for you. What else can I do to help prevent falls? Wear shoes that: Do not have high heels. Have rubber bottoms. Are comfortable and fit you well. Are closed at the toe. Do not wear sandals. If you use a stepladder: Make sure that it is fully opened. Do not climb a closed stepladder. Make sure that both sides of the stepladder are locked into place. Ask someone to hold it for you, if possible. Clearly mark and make sure that you can see: Any grab bars or handrails. First and last steps. Where the edge of each step is. Use tools that help you move around (mobility aids)  if they are needed. These include: Canes. Walkers. Scooters. Crutches. Turn on the lights when you go into a dark area. Replace any light bulbs as soon as they burn out. Set up your furniture so you have a clear path. Avoid moving your furniture around. If any of your floors are uneven, fix them. If there are any pets around you, be aware of where they are. Review your medicines with your doctor. Some medicines can make you feel dizzy. This can increase your chance of falling. Ask your doctor what other things that you can do to help prevent falls. This information is not intended to replace advice given to you by your health care provider. Make sure you discuss any questions you have with your health care provider. Document Released: 10/02/2009 Document Revised: 05/13/2016 Document Reviewed: 01/10/2015 Elsevier Interactive Patient Education  2017 [employer].

## 2021-10-19 NOTE — Progress Notes (Signed)
   This service is provided via telemedicine  No vital signs collected/recorded due to the encounter was a telemedicine visit.   Location of patient (ex: home, work):  Home  Patient consents to a telephone visit: Yes, see telephone visit dated 10/19/21  Location of the provider (ex: office, home):  [facility], Office   Name of any referring provider:  N/A  Names of all persons participating in the telemedicine service and their role in the encounter:  S.Chrae B/CMA, Sherrie Mustache, NP, and Patient   Time spent on call:  8 min with medical assistant

## 2021-10-19 NOTE — Progress Notes (Signed)
Subjective:   Isaiah Wu is a 70 y.o. male who presents for Medicare Annual/Subsequent preventive examination.  Review of Systems     Cardiac Risk Factors include: advanced age (>83men, >36 women);obesity (BMI >30kg/m2);hypertension     Objective:    There were no vitals filed for this visit. There is no height or weight on file to calculate BMI.  Advanced Directives 10/19/2021 09/14/2021 03/16/2021 10/16/2020 09/15/2020 07/21/2015 07/21/2015  Does Patient Have a Medical Advance Directive? No No No No No Yes No  Type of Advance Directive - - - - - Press photographer -  Does patient want to make changes to medical advance directive? - - - - - No - Patient declined -  Copy of [place] in Chart? - - - - - No - copy requested -  Would patient like information on creating a medical advance directive? Yes (MAU/Ambulatory/Procedural Areas - Information given) Yes (MAU/Ambulatory/Procedural Areas - Information given) Yes (MAU/Ambulatory/Procedural Areas - Information given) - Yes (MAU/Ambulatory/Procedural Areas - Information given) - No - patient declined information    Current Medications (verified) Outpatient Encounter Medications as of 10/19/2021  Medication Sig   amoxicillin-clavulanate (AUGMENTIN) 500-125 MG tablet Take 1 tablet by mouth 3 (three) times daily. Will end on Wednesday 10/22/21   carvedilol (COREG) 12.5 MG tablet TAKE 1 TABLET BY MOUTH 2 (TWO) TIMES DAILY WITH A MEAL.   furosemide (LASIX) 20 MG tablet TAKE 1 TABLET BY MOUTH EVERY DAY AS NEEDED   loratadine (CLARITIN) 10 MG tablet Take 10 mg by mouth daily as needed for allergies (Seasonal).   Multiple Vitamins-Minerals (CENTRUM SILVER 50+MEN) TABS Take by mouth.   sacubitril-valsartan (ENTRESTO) 49-51 MG Take 1 tablet by mouth 2 (two) times daily.   spironolactone (ALDACTONE) 25 MG tablet TAKE 1 TABLET BY MOUTH EVERY DAY   No facility-administered encounter medications on file as of  10/19/2021.    Allergies (verified) Patient has no known allergies.   History: Past Medical History:  Diagnosis Date   Actinic keratoses    Per Records from [place]    Cardiomyopathy ([place]) 5/00/9381   Chronic systolic CHF (congestive heart failure) ([place]) 04/14/2015   a. s/p STJ CRTD b. EF normalized with CRT therapy   Colon polyp    Per Records from [place]    Elevated PSA    Followed by Dr. 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   placed by Dr.McLean, Per Records from [place]    LBBB (left bundle branch block)    Non-ischemic cardiomyopathy ([place])    Per Records from [place] [address] of breath    Per Records from [place]    Syncope 05/30/2015   Past Surgical History:  Procedure Laterality Date   CARDIAC CATHETERIZATION     COLONOSCOPY  08/30/2005   Per Records from [place] N/A 07/21/2015   STJ  CRTD implanted by Dr Caryl Comes for NICM, CHF   LEFT AND RIGHT HEART CATHETERIZATION WITH CORONARY ANGIOGRAM N/A 04/09/2015   Procedure: LEFT AND RIGHT HEART CATHETERIZATION WITH CORONARY ANGIOGRAM;  Surgeon: Jacolyn Reedy, MD;  Location: [facility] CATH LAB;  Service: Cardiovascular;  Laterality: N/A;   ORIF ELBOW FRACTURE Left 1981   SHOULDER SURGERY Left 05/2008   "had to put cadavear bone in and reattach muscle to it"   TONSILLECTOMY AND ADENOIDECTOMY  74's   Family History  Problem Relation Age of Onset   CVA Mother    High Cholesterol Mother        Per  Records from [place] Mother        Per Records from [place]    High blood pressure Mother        Per Records from [place]    Bone cancer Father    Diabetes Father    Colon polyps Father        Precamcerous, Per Records from [place]    High blood pressure Sister    Colon cancer Neg Hx    Esophageal cancer Neg Hx    Rectal cancer Neg Hx    Stomach cancer Neg Hx    Social History   Socioeconomic History   Marital status: Married    Spouse name: Not on file   Number of children: Not on file   Years of education: Not on file   Highest education level: Not on file  Occupational History   Not on file  Tobacco Use   Smoking status: Former    Packs/day: 1.00    Years: 2.00    Pack years: 2.00    Types: Cigarettes    Start date: 12/20/1970    Quit date: 12/20/1973    Years since quitting: 47.8   Smokeless tobacco: Never   Tobacco comments:    'quit smoking in the 1970's"  Vaping Use   Vaping Use: Never used  Substance and Sexual Activity   Alcohol use: Yes    Alcohol/week: 14.0 standard drinks    Types: 14 Glasses of wine per week   Drug use: No   Sexual activity: Yes  Other Topics Concern   Not on file  Social History Narrative   Diet No      Do you drink/eat things with caffeine Yes, 2 cups of coffee daily      Marital Status Yes What year were you married? 1976      Do you live in a house, apartment, assisted living, condo, trailer, etc.? House      Is it one or more stories? Yes      How many persons live in your home? 2         Do you have any pets in your home?(please list) No      Highest level of education completed: Associate      Current or past profession:Supply chain executive-Now volunteering      Do you exercise?: Yes  Type and how often: Ellipitical cycling 5 days a week      Do you have a Living Will? No      Do you have a DNR form?  No       If not, would you like to discuss one? Yes      Do you  have signed POA/HPOA forms? No      Do you have difficulty bathing or dressing yourself? No  Do you have difficulty preparing food or eating? No      Do you have difficulty managing medications? No      Do you have difficulty managing your finances? No      Do you have difficulty affording your medications? No                  Social Determinants of Radio broadcast assistant Strain: Not on file  Food Insecurity: Not on file  Transportation Needs: Not on file  Physical Activity: Not on file  Stress: Not on file  Social Connections: Not on file    Tobacco Counseling Counseling given: Not Answered Tobacco comments: 'quit smoking in the 1970's"   Clinical Intake:  Pre-visit preparation completed: Yes  Pain : No/denies pain     BMI - recorded: 30 Nutritional Status: BMI > 30  Obese Diabetes: No  How often do you need to have someone help you when you read instructions, pamphlets, or other written materials from your doctor or pharmacy?: 1 - Never  Diabetic?no         Activities of Daily Living In your present state of health, do you have any difficulty performing the following activities: 10/19/2021  Hearing? N  Vision? N  Difficulty concentrating or making decisions? N  Walking or climbing stairs? N  Dressing or bathing? N  Doing errands, shopping? N  Preparing Food and eating ? N  Using the Toilet? N  In the past six months, have you accidently leaked urine? N  Do you have problems with loss of bowel control? N  Managing your Medications? N  Managing your Finances? N  Housekeeping or managing your Housekeeping? N  Some recent data might be hidden    Patient Care Team: Lauree Chandler, NP as PCP - General (Geriatric Medicine) Jacolyn Reedy, MD as Consulting Physician (Cardiology) Larey Dresser, MD as Consulting Physician (Cardiology) Deboraha Sprang, MD as Consulting Physician (Cardiology)  Indicate any recent Medical Services  you may have received from other than [facility] providers in the past year (date may be approximate).     Assessment:   This is a routine wellness examination for [place].  Hearing/Vision screen Hearing Screening - Comments:: No hearing issues  Vision Screening - Comments:: Last eye exam late Feb 2022, [facility] [employer]   Dietary issues and exercise activities discussed: Current Exercise Habits: Home exercise routine, Type of exercise: walking, Time (Minutes): 60, Frequency (Times/Week): 6, Weekly Exercise (Minutes/Week): 360   Goals Addressed             This Visit's Progress    Patient Stated       To get 10K steps a day       Depression Screen PHQ 2/9 Scores 10/19/2021 09/14/2021 03/16/2021 10/16/2020 09/15/2020  PHQ - 2 Score 0 0 0 0 0    Fall Risk Fall Risk  10/19/2021 09/14/2021 03/16/2021 10/16/2020 09/15/2020  Falls in the past year? 0 0 0 0 0  Number falls in past yr: 0 0 0 0 0  Injury with Fall? 0 0 0 0 0  Risk for fall due to : No Fall Risks No Fall Risks - - -  Follow up Falls evaluation completed Falls evaluation completed - - -    FALL RISK PREVENTION PERTAINING TO THE HOME:  Any stairs in or around the home? Yes  If so, are there any without handrails? No  Home free of loose throw rugs in walkways,  pet beds, electrical cords, etc? Yes  Adequate lighting in your home to reduce risk of falls? Yes   ASSISTIVE DEVICES UTILIZED TO PREVENT FALLS:  Life alert? No  Use of a cane, walker or w/c? No  Grab bars in the bathroom? Yes  Shower chair or bench in shower? No  Elevated toilet seat or a handicapped toilet? Yes   TIMED UP AND GO:  Was the test performed? No .   Cognitive Function:     6CIT Screen 10/19/2021 10/16/2020  What Year? 0 points 0 points  What month? 0 points 0 points  What time? 0 points 0 points  Count back from 20 0 points 0 points  Months in reverse 0 points 0 points  Repeat phrase 0 points 0 points  Total Score 0 0     Immunizations Immunization History  Administered Date(s) Administered   Hepatitis B, adult 03/06/2015   Influenza, High Dose Seasonal PF 10/09/2018, 09/28/2019, 09/01/2020, 09/09/2021   Influenza,inj,Quad PF,6+ Mos 09/17/2016, 11/15/2017   Moderna Covid-19 Vaccine Bivalent Booster 17yrs & up 09/09/2021   PFIZER(Purple Top)SARS-COV-2 Vaccination 02/02/2020, 02/24/2020, 10/18/2020   Pneumococcal Polysaccharide-23 09/15/2020   Td 12/20/2005   Tdap 09/17/2016, 03/06/2021   Zoster Recombinat (Shingrix) 03/06/2021   Zoster, Live 08/29/2015    TDAP status: Up to date  Flu Vaccine status: Up to date  Pneumococcal vaccine status: Due, Education has been provided regarding the importance of this vaccine. Advised may receive this vaccine at local pharmacy or Health Dept. Aware to provide a copy of the vaccination record if obtained from local pharmacy or Health Dept. Verbalized acceptance and understanding.  Covid-19 vaccine status: Completed vaccines  Qualifies for Shingles Vaccine? Yes   Zostavax completed Yes   Shingrix Completed?: No.    Education has been provided regarding the importance of this vaccine. Patient has been advised to call insurance company to determine out of pocket expense if they have not yet received this vaccine. Advised may also receive vaccine at local pharmacy or Health Dept. Verbalized acceptance and understanding.  Screening Tests Health Maintenance  Topic Date Due   Zoster Vaccines- Shingrix (2 of 2) 05/01/2021   Pneumonia Vaccine 62+ Years old (2 - PCV) 09/15/2021   TETANUS/TDAP  03/07/2031   COLONOSCOPY (Pts 45-67yrs Insurance coverage will need to be confirmed)  07/29/2031   INFLUENZA VACCINE  Completed   COVID-19 Vaccine  Completed   Hepatitis C Screening  Completed   HPV VACCINES  Aged Out    Health Maintenance  Health Maintenance Due  Topic Date Due   Zoster Vaccines- Shingrix (2 of 2) 05/01/2021   Pneumonia Vaccine 26+ Years old (2 - PCV)  09/15/2021    Colorectal cancer screening: Type of screening: Colonoscopy. Completed 2022. Repeat every 10 years  Lung Cancer Screening: (Low Dose CT Chest recommended if Age 9-80 years, 30 pack-year currently smoking OR have quit w/in 15years.) does not qualify.   Lung Cancer Screening Referral: na  Additional Screening:  Hepatitis C Screening: does qualify; Completed 2016  Vision Screening: Recommended annual ophthalmology exams for early detection of glaucoma and other disorders of the eye. Is the patient up to date with their annual eye exam?  Yes  Who is the provider or what is the name of the office in which the patient attends annual eye exams? [facility] If pt is not established with a provider, would they like to be referred to a provider to establish care? No .   Dental Screening: Recommended  annual dental exams for proper oral hygiene  Community Resource Referral / Chronic Care Management: CRR required this visit?  No   CCM required this visit?  No      Plan:     I have personally reviewed and noted the following in the patient's chart:   Medical and social history Use of alcohol, tobacco or illicit drugs  Current medications and supplements including opioid prescriptions. Patient is not currently taking opioid prescriptions. Functional ability and status Nutritional status Physical activity Advanced directives List of other physicians Hospitalizations, surgeries, and ER visits in previous 12 months Vitals Screenings to include cognitive, depression, and falls Referrals and appointments  In addition, I have reviewed and discussed with patient certain preventive protocols, quality metrics, and best practice recommendations. A written personalized care plan for preventive services as well as general preventive health recommendations were provided to patient.     Lauree Chandler, NP   10/19/2021    Virtual Visit via Telephone Note  I connected with  pt on 10/19/21 at  8:30 AM EDT by telephone and verified that I am speaking with the correct person using two identifiers.  Location: Patient: home Provider: [place]   I discussed the limitations, risks, security and privacy concerns of performing an evaluation and management service by telephone and the availability of in person appointments. I also discussed with the patient that there may be a patient responsible charge related to this service. The patient expressed understanding and agreed to proceed.   I discussed the assessment and treatment plan with the patient. The patient was provided an opportunity to ask questions and all were answered. The patient agreed with the plan and demonstrated an understanding of the instructions.   The patient was advised to call back or seek an in-person evaluation if the symptoms worsen or if the condition fails to improve as anticipated.  I provided 16  minutes of non-face-to-face time during this encounter.  Carlos American. Harle Battiest Avs printed and mailed

## 2021-10-20 ENCOUNTER — Ambulatory Visit: Payer: Medicare Other | Admitting: Nurse Practitioner

## 2021-10-22 ENCOUNTER — Ambulatory Visit: Payer: Medicare Other | Admitting: Internal Medicine

## 2021-10-22 ENCOUNTER — Other Ambulatory Visit: Payer: Self-pay

## 2021-10-22 VITALS — BP 122/66 | HR 69 | Ht 72.0 in | Wt 227.0 lb

## 2021-10-22 DIAGNOSIS — I428 Other cardiomyopathies: Secondary | ICD-10-CM | POA: Diagnosis not present

## 2021-10-22 DIAGNOSIS — I447 Left bundle-branch block, unspecified: Secondary | ICD-10-CM | POA: Diagnosis not present

## 2021-10-22 DIAGNOSIS — I5022 Chronic systolic (congestive) heart failure: Secondary | ICD-10-CM | POA: Diagnosis not present

## 2021-10-22 DIAGNOSIS — Z95810 Presence of automatic (implantable) cardiac defibrillator: Secondary | ICD-10-CM

## 2021-10-22 LAB — CUP PACEART INCLINIC DEVICE CHECK
Battery Remaining Longevity: 10 mo
Brady Statistic RA Percent Paced: 27 %
Brady Statistic RV Percent Paced: 99.65 %
Date Time Interrogation Session: 20221103144418
HighPow Impedance: 76.5 Ohm
Implantable Lead Implant Date: 20160801
Implantable Lead Implant Date: 20160801
Implantable Lead Implant Date: 20160801
Implantable Lead Location: 753858
Implantable Lead Location: 753859
Implantable Lead Location: 753860
Implantable Lead Model: 7122
Implantable Pulse Generator Implant Date: 20160801
Lead Channel Impedance Value: 1062.5 Ohm
Lead Channel Impedance Value: 387.5 Ohm
Lead Channel Impedance Value: 425 Ohm
Lead Channel Pacing Threshold Amplitude: 0.75 V
Lead Channel Pacing Threshold Amplitude: 0.75 V
Lead Channel Pacing Threshold Amplitude: 1.25 V
Lead Channel Pacing Threshold Amplitude: 1.25 V
Lead Channel Pacing Threshold Amplitude: 2.25 V
Lead Channel Pacing Threshold Amplitude: 2.25 V
Lead Channel Pacing Threshold Pulse Width: 0.5 ms
Lead Channel Pacing Threshold Pulse Width: 0.5 ms
Lead Channel Pacing Threshold Pulse Width: 0.5 ms
Lead Channel Pacing Threshold Pulse Width: 0.5 ms
Lead Channel Pacing Threshold Pulse Width: 0.5 ms
Lead Channel Pacing Threshold Pulse Width: 0.5 ms
Lead Channel Sensing Intrinsic Amplitude: 3.6 mV
Lead Channel Setting Pacing Amplitude: 1.75 V
Lead Channel Setting Pacing Amplitude: 2.5 V
Lead Channel Setting Pacing Amplitude: 3 V
Lead Channel Setting Pacing Pulse Width: 0.5 ms
Lead Channel Setting Pacing Pulse Width: 0.5 ms
Lead Channel Setting Sensing Sensitivity: 0.5 mV
Pulse Gen Serial Number: 7290135

## 2021-10-22 NOTE — Progress Notes (Signed)
ode     Patient Care Team: Lauree Chandler, NP as PCP - General (Geriatric Medicine) Jacolyn Reedy, MD as Consulting Physician (Cardiology) Larey Dresser, MD as Consulting Physician (Cardiology) Deboraha Sprang, MD as Consulting Physician (Cardiology)   HPI  Isaiah Wu is a 70 y.o. male Seen in followup for CRT- ICD implanted for NICM LBBB and hx of syncope  There has been interval resolution of his LVEF  The patient denies chest pain, shortness of breath, nocturnal dyspnea, orthopnea or peripheral edema.  There have been no palpitations, lightheadedness or syncope. Marland Kitchen    DATE TEST EF   4/16 Echo  15%   4/16 LHC  CAs normal  2/17 Echo  45%   10/18 Echo  55%   7/22 Echo  50-55%    Date Cr K Hgb  7/22 1.10 3.9 15.5 (9/22)           Device History: STJ CRTD implanted 2016 for NICM, CHF, LBBB History of appropriate therapy: No History of AAD therapy: No     Past Medical History:  Diagnosis Date   Actinic keratoses    Per Records from [place]    Cardiomyopathy ([place]) 2/37/6283   Chronic systolic CHF (congestive heart failure) ([place]) 04/14/2015   a. s/p STJ CRTD b. EF normalized with CRT therapy   Colon polyp    Per Records from [place]    Elevated PSA    Followed by Dr. 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   placed by Dr.McLean, Per Records from [place]    LBBB (left bundle branch block)    Non-ischemic cardiomyopathy ([place])    Per Records from [place] of breath    Per Records from [place]    Syncope 05/30/2015    Past Surgical History:  Procedure Laterality Date   CARDIAC CATHETERIZATION     COLONOSCOPY  08/30/2005   Per Records from [place] N/A 07/21/2015   STJ CRTD implanted by Dr Caryl Comes for NICM, CHF   LEFT AND RIGHT HEART CATHETERIZATION WITH CORONARY ANGIOGRAM N/A 04/09/2015   Procedure: LEFT AND RIGHT HEART CATHETERIZATION WITH CORONARY ANGIOGRAM;  Surgeon: Jacolyn Reedy, MD;  Location: [facility] CATH LAB;  Service: Cardiovascular;  Laterality: N/A;   ORIF ELBOW FRACTURE Left 1981   SHOULDER SURGERY Left 05/2008   "had to put cadavear bone in and reattach muscle to it"   TONSILLECTOMY AND ADENOIDECTOMY  1960's    Current Outpatient Medications  Medication Sig Dispense Refill   amoxicillin-clavulanate (AUGMENTIN) 500-125 MG tablet Take 1 tablet by mouth 3 (three) times daily. Will end on Wednesday 10/22/21     carvedilol (COREG) 12.5 MG tablet TAKE 1 TABLET BY MOUTH 2 (TWO) TIMES DAILY WITH A MEAL. 180 tablet 3   furosemide (LASIX) 20 MG tablet TAKE 1 TABLET BY MOUTH EVERY DAY AS NEEDED 90  tablet 2   loratadine (CLARITIN) 10 MG tablet Take 10 mg by mouth daily as needed for allergies (Seasonal).     Multiple Vitamins-Minerals (CENTRUM SILVER 50+MEN) TABS Take by mouth.     sacubitril-valsartan (ENTRESTO) 49-51 MG Take 1 tablet by mouth 2 (two) times daily. 60 tablet 6   spironolactone (ALDACTONE) 25 MG tablet TAKE 1 TABLET BY MOUTH EVERY DAY 90 tablet 3   No current facility-administered medications for this visit.    No Known Allergies    Review of Systems negative except from HPI and PMH  Physical Exam BP 122/66   Pulse 69   Ht 6' (1.829 m)   Wt 227 lb (103 kg)   SpO2 96%   BMI 30.79 kg/m  Well developed and well nourished in no acute distress HENT normal Neck supple with JVP-flat Clear Device pocket well healed; without hematoma or erythema.  There is no  tethering  Regular rate and rhythm, no gallop No  murmur Abd-soft with active BS No Clubbing cyanosis  edema Skin-warm and dry A & Oriented  Grossly normal sensory and motor function  ECG 7/22--AV pacing @ 70 Neg QRS lead 1 upright      Assessment and  Plan  Nonischemic cardiomyopathy interval near normalization  Congestive heart failure class II--diastolic   CRT-D -MP The patient's device was interrogated.     Pt heart failure status is stable. Continue lasix 20  mg prn.  Electrolytes are stable.    Cardiomyopathy remains significantly improved.  Discussed with [place] regarding multipoint pacing, and the use of a shared electrolyte ie M2 mitigates what ever predicted benefit there is from multipoint pacing.  Hence, we will inactivate one of the electrodes, reviewing the chest x-ray would like to maintain D1 as part of the system perhaps to coil.  Will continue his medications including Entresto 49-51 Aldactone 25 and carvedilol 12.5.

## 2021-10-22 NOTE — Patient Instructions (Signed)
Medication Instructions:  Your physician recommends that you continue on your current medications as directed. Please refer to the Current Medication list given to you today.  *If you need a refill on your cardiac medications before your next appointment, please call your pharmacy*   Lab Work: None ordered.  If you have labs (blood work) drawn today and your tests are completely normal, you will receive your results only by: [place] (if you have MyChart) OR A paper copy in the mail If you have any lab test that is abnormal or we need to change your treatment, we will call you to review the results.   Testing/Procedures: None ordered.    Follow-Up: At [facility], you and your health needs are our priority.  As part of our continuing mission to provide you with exceptional heart care, we have created designated Provider Care Teams.  These Care Teams include your primary Cardiologist (physician) and Advanced Practice Providers (APPs -  Physician Assistants and Nurse Practitioners) who all work together to provide you with the care you need, when you need it.  We recommend signing up for the patient portal called "MyChart".  Sign up information is provided on this After Visit Summary.  MyChart is used to connect with patients for Virtual Visits (Telemedicine).  Patients are able to view lab/test results, encounter notes, upcoming appointments, etc.  Non-urgent messages can be sent to your provider as well.   To learn more about what you can do with MyChart, go to [email or web address].    Your next appointment:   10 month(s)  The format for your next appointment:   In Person  Provider:   Legrand Como "Oda Kilts, PA-C

## 2021-10-26 ENCOUNTER — Telehealth (HOSPITAL_COMMUNITY): Payer: Self-pay | Admitting: Surgery

## 2021-10-26 NOTE — Addendum Note (Signed)
Addended by: Rose Phi on: 10/26/2021 11:45 AM   Modules accepted: Orders

## 2021-10-26 NOTE — Telephone Encounter (Signed)
Patient contacted to perform ordered home sleep study.  I left a message to request completion and asked that he return the call with any questions or concerns.

## 2021-10-29 ENCOUNTER — Encounter: Payer: Medicare Other | Admitting: Nurse Practitioner

## 2021-11-19 ENCOUNTER — Other Ambulatory Visit (HOSPITAL_COMMUNITY): Payer: Self-pay | Admitting: *Deleted

## 2021-11-19 MED ORDER — ENTRESTO 49-51 MG PO TABS: 1.0000 | ORAL_TABLET | Freq: Two times a day (BID) | ORAL | 3 refills | Status: DC

## 2021-11-25 ENCOUNTER — Telehealth (HOSPITAL_COMMUNITY): Payer: Self-pay | Admitting: Pharmacy Technician

## 2021-11-25 NOTE — Telephone Encounter (Signed)
Advanced Heart Failure Patient Advocate Encounter  Patient sent in prescriber portion of Entresto assistance renewal application and requested that it be signed and he will come pick up afterwards. Called to see if he wanted me to send it in for him and he send his portion. Patient declined, wanted to send in together.   Envelope placed at check in desk for him to pick up. RX sent to [employer].  Charlann Boxer, CPhT

## 2021-11-27 ENCOUNTER — Telehealth (HOSPITAL_COMMUNITY): Payer: Self-pay | Admitting: Surgery

## 2021-11-27 NOTE — Telephone Encounter (Signed)
I attempted to reach patient and left a message to request that he perform the ordered home sleep study or return it to the AHF clinic.

## 2021-12-07 ENCOUNTER — Encounter (INDEPENDENT_AMBULATORY_CARE_PROVIDER_SITE_OTHER): Payer: Medicare Other | Admitting: Cardiology

## 2021-12-07 DIAGNOSIS — G4733 Obstructive sleep apnea (adult) (pediatric): Secondary | ICD-10-CM | POA: Diagnosis not present

## 2021-12-08 NOTE — Procedures (Signed)
° °  Sleep Study Report  Patient Information Study Date: 12/07/21 Patient Name: Isaiah Wu Patient ID: [id number] Birth Date: August 22, 2051 Age: 70 Gender: Male Referring Physician:Dalton Aundra Dubin, MD  TEST DESCRIPTION: Home sleep apnea testing was completed using the WatchPat, a Type 1 device, utilizing peripheral arterial tonometry (PAT), chest movement, actigraphy, pulse oximetry, pulse rate, body position and snore. AHI was calculated with apnea and hypopnea using valid sleep time as the denominator. RDI includes apneas, hypopneas, and RERAs. The data acquired and the scoring of sleep and all associated events were performed in accordance with the recommended standards and specifications as outlined in the AASM Manual for the Scoring of Sleep and Associated Events 2.2.0 (2015).  FINDINGS: 1. Mild Obstructive Sleep Apnea with AHI 6.2/hr. 2. No Central Sleep Apnea with pAHIc 0.5/hr. 3. Oxygen desaturations as low as 75%. 4. Mild to moderate snoring was present. O2 sats were < 88% for 0.3 min. 5. Total sleep time was 8 hrs and 11 min. 6. 26.1% of total sleep time was spent in REM sleep. 7. Shortened sleep onset latency at 5 min. 8. Normal REM sleep onset latency at 99 min. 9. Total awakenings were 7.  DIAGNOSIS: Mild Obstructive Sleep Apnea (G47.33)  RECOMMENDATIONS: 1. Clinical correlation of these findings is necessary. The decision to treat obstructive sleep apnea (OSA) is usually based on the presence of apnea symptoms or the presence of associated medical conditions such as Hypertension, Congestive Heart Failure, Atrial Fibrillation or Obesity. The most common symptoms of OSA are snoring, gasping for breath while sleeping, daytime sleepiness and fatigue.  2. Initiating apnea therapy is recommended given the presence of symptoms and/or associated conditions. Recommend proceeding with one of the following:   a. Auto-CPAP therapy with a pressure range of 5-20cm H2O.   b. An  oral appliance (OA) that can be obtained from certain dentists with expertise in sleep medicine. These are primarily of use in non-obese patients with mild and moderate disease.   c. An ENT consultation which may be useful to look for specific causes of obstruction and possible treatment options.   d. If patient is intolerant to PAP therapy, consider referral to ENT for evaluation for hypoglossal nerve stimulator.  3. Close follow-up is necessary to ensure success with CPAP or oral appliance therapy for maximum benefit .  4. A follow-up oximetry study on CPAP is recommended to assess the adequacy of therapy and determine the need for supplemental oxygen or the potential need for Bi-level therapy. An arterial blood gas to determine the adequacy of baseline ventilation and oxygenation should also be considered.  5. Healthy sleep recommendations include: adequate nightly sleep (normal 7-9 hrs/night), avoidance of caffeine after noon and alcohol near bedtime, and maintaining a sleep environment that is cool, dark and quiet.  6. Weight loss for overweight patients is recommended. Even modest amounts of weight loss can significantly improve the severity of sleep apnea.  7. Snoring recommendations include: weight loss where appropriate, side sleeping, and avoidance of alcohol before bed.  8. Operation of motor vehicle should be avoided when sleepy.  Signature: Electronically Signed: 12/09/21 Fransico Him, MD; [facility]; [place], [place] Board of Sleep Medicine

## 2021-12-22 ENCOUNTER — Ambulatory Visit: Payer: Medicare Other

## 2021-12-22 DIAGNOSIS — G4733 Obstructive sleep apnea (adult) (pediatric): Secondary | ICD-10-CM

## 2021-12-24 ENCOUNTER — Other Ambulatory Visit: Payer: Self-pay

## 2021-12-24 DIAGNOSIS — G4733 Obstructive sleep apnea (adult) (pediatric): Secondary | ICD-10-CM

## 2021-12-30 ENCOUNTER — Ambulatory Visit (INDEPENDENT_AMBULATORY_CARE_PROVIDER_SITE_OTHER): Payer: Medicare Other

## 2021-12-30 DIAGNOSIS — I428 Other cardiomyopathies: Secondary | ICD-10-CM

## 2021-12-31 ENCOUNTER — Telehealth (HOSPITAL_COMMUNITY): Payer: Self-pay | Admitting: Pharmacist

## 2021-12-31 MED ORDER — ENTRESTO 49-51 MG PO TABS: 1.0000 | ORAL_TABLET | Freq: Two times a day (BID) | ORAL | 3 refills | Status: DC

## 2021-12-31 NOTE — Telephone Encounter (Signed)
Entresto prescription sent to [employer].   Audry Riles, PharmD, BCPS, BCCP, CPP Heart Failure Clinic Pharmacist [phone]

## 2022-01-01 ENCOUNTER — Telehealth (HOSPITAL_COMMUNITY): Payer: Self-pay | Admitting: Pharmacist

## 2022-01-01 LAB — CUP PACEART REMOTE DEVICE CHECK
Battery Remaining Longevity: 9 mo
Battery Remaining Percentage: 13 %
Battery Voltage: 2.71 V
Brady Statistic AP VP Percent: 28 %
Brady Statistic AP VS Percent: 1 %
Brady Statistic AS VP Percent: 72 %
Brady Statistic AS VS Percent: 1 %
Brady Statistic RA Percent Paced: 27 %
Date Time Interrogation Session: 20230112200009
HighPow Impedance: 72 Ohm
HighPow Impedance: 72 Ohm
Implantable Lead Implant Date: 20160801
Implantable Lead Implant Date: 20160801
Implantable Lead Implant Date: 20160801
Implantable Lead Location: 753858
Implantable Lead Location: 753859
Implantable Lead Location: 753860
Implantable Lead Model: 7122
Implantable Pulse Generator Implant Date: 20160801
Lead Channel Impedance Value: 380 Ohm
Lead Channel Impedance Value: 440 Ohm
Lead Channel Impedance Value: 950 Ohm
Lead Channel Pacing Threshold Amplitude: 0.75 V
Lead Channel Pacing Threshold Amplitude: 1.25 V
Lead Channel Pacing Threshold Amplitude: 1.75 V
Lead Channel Pacing Threshold Pulse Width: 0.5 ms
Lead Channel Pacing Threshold Pulse Width: 0.5 ms
Lead Channel Pacing Threshold Pulse Width: 1.5 ms
Lead Channel Sensing Intrinsic Amplitude: 11.7 mV
Lead Channel Sensing Intrinsic Amplitude: 3.3 mV
Lead Channel Setting Pacing Amplitude: 1.75 V
Lead Channel Setting Pacing Amplitude: 2.5 V
Lead Channel Setting Pacing Amplitude: 2.5 V
Lead Channel Setting Pacing Pulse Width: 0.5 ms
Lead Channel Setting Pacing Pulse Width: 1.5 ms
Lead Channel Setting Sensing Sensitivity: 0.5 mV
Pulse Gen Serial Number: 7290135

## 2022-01-01 NOTE — Telephone Encounter (Signed)
Advanced Heart Failure Patient Advocate Encounter   Patient was approved to receive Entresto from [employer].   Patient ID: [id number] Effective dates: 12/20/21 through 12/19/22  Audry Riles, PharmD, BCPS, BCCP, CPP Heart Failure Clinic Pharmacist [phone]

## 2022-01-08 ENCOUNTER — Telehealth: Payer: Self-pay | Admitting: *Deleted

## 2022-01-08 NOTE — Telephone Encounter (Signed)
Left message to return a call to discuss itamar results and recommendations.

## 2022-01-08 NOTE — Progress Notes (Signed)
Remote ICD transmission.   

## 2022-01-20 NOTE — Telephone Encounter (Signed)
Left message to return a call to me to discuss sleep study results.

## 2022-01-27 NOTE — Telephone Encounter (Signed)
Left message to return a call to discuss sleep study results and recommendations. This is the 3rd message. If no response I will send a my chart message.

## 2022-03-12 ENCOUNTER — Encounter: Payer: Self-pay | Admitting: Nurse Practitioner

## 2022-03-12 ENCOUNTER — Ambulatory Visit (INDEPENDENT_AMBULATORY_CARE_PROVIDER_SITE_OTHER): Payer: Medicare Other | Admitting: Nurse Practitioner

## 2022-03-12 ENCOUNTER — Other Ambulatory Visit: Payer: Self-pay

## 2022-03-12 VITALS — BP 122/78 | HR 71 | Temp 97.8°F | Ht 72.0 in | Wt 234.0 lb

## 2022-03-12 DIAGNOSIS — I5022 Chronic systolic (congestive) heart failure: Secondary | ICD-10-CM

## 2022-03-12 DIAGNOSIS — M2550 Pain in unspecified joint: Secondary | ICD-10-CM | POA: Diagnosis not present

## 2022-03-12 DIAGNOSIS — I10 Essential (primary) hypertension: Secondary | ICD-10-CM | POA: Diagnosis not present

## 2022-03-12 DIAGNOSIS — Z6830 Body mass index (BMI) 30.0-30.9, adult: Secondary | ICD-10-CM

## 2022-03-12 DIAGNOSIS — J302 Other seasonal allergic rhinitis: Secondary | ICD-10-CM

## 2022-03-12 DIAGNOSIS — E6609 Other obesity due to excess calories: Secondary | ICD-10-CM | POA: Diagnosis not present

## 2022-03-12 DIAGNOSIS — I428 Other cardiomyopathies: Secondary | ICD-10-CM | POA: Diagnosis not present

## 2022-03-12 NOTE — Progress Notes (Signed)
? ? ?Careteam: ?Patient Care Team: ?Lauree Chandler, NP as PCP - General (Geriatric Medicine) ?Jacolyn Reedy, MD as Consulting Physician (Cardiology) ?Larey Dresser, MD as Consulting Physician (Cardiology) ?Deboraha Sprang, MD as Consulting Physician (Cardiology) ? ?PLACE OF SERVICE:  ?[facility]  ?Advanced Directive information ?Does Patient Have a Medical Advance Directive?: No, Does patient want to make changes to medical advance directive?: Yes (MAU/Ambulatory/Procedural Areas - Information given) ? ?No Known Allergies ? ?Chief Complaint  ?Patient presents with  ? Medical Management of Chronic Issues  ?  6 month follow-up, fasting if any labs are needed.  NCIR verified.  ?  ? ? ? ?HPI: Patient is a 71 y.o. male for a 6 month follow up  ? ?Concerned that toe hurt a few weeks ago and finger hurts after eating a steak last week. States that his dad had a history of gout. Also on a daily diuretic. ? ?CHF-  Takes Entresto, Lasix PRN , and Spironolactone. No issues or concerns. Sees heart failure in October. No increase in edema. No worsening shortness of breath or chest pains.  ? ?Patient has a pacemaker, followed by cardiologist.   ? ?Hypertension- on carvedilol 12.5 mg. Stable condition. Pt checks Bp twice daily. ? ?Allergies controlled with Claritin  ? ?Exercises routinely ? ?Has a dentist appointment  coming up and will get eye exam soon.  ? ?Review of Systems:  ?Review of Systems  ?Constitutional:  Negative for chills, fever, malaise/fatigue and weight loss.  ?HENT:  Negative for tinnitus.   ?Respiratory:  Negative for cough, sputum production, shortness of breath and wheezing.   ?Cardiovascular:  Negative for chest pain, palpitations and leg swelling.  ?Gastrointestinal:  Negative for abdominal pain, constipation, diarrhea, heartburn, nausea and vomiting.  ?Genitourinary:  Negative for dysuria, frequency and urgency.  ?Musculoskeletal:  Positive for joint pain. Negative for back pain, falls and  myalgias.  ?Skin: Negative.   ?Neurological:  Negative for dizziness and headaches.  ?Psychiatric/Behavioral:  Negative for depression and memory loss. The patient does not have insomnia.   ? ?Past Medical History:  ?Diagnosis Date  ? Actinic keratoses   ? Per Records from [place]   ? Cardiomyopathy ([place]) 04/07/2015  ? Chronic systolic CHF (congestive heart failure) ([place]) 04/14/2015  ? a. s/p STJ CRTD b. EF normalized with CRT therapy  ? Colon polyp   ? Per Records from [place]   ? Elevated PSA   ? Followed by Dr. Serita Butcher (Urologist), Per Records from [place]   ? Fatigue   ? Per Records from [place]   ? H/O echocardiogram 04/07/2015  ? Per Records from [place]   ? Heart murmur   ? Per Records from [place]   ? History of basal cell carcinoma   ? Per Records from [place]   ? History of EKG 04/02/2015  ? Per Records from [place]   ? Hx of colonoscopy   ? Per [place] new patient packet  ? Hypertension   ? 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 placed by Dr.McLean, Per Records from [place]   ? LBBB (left bundle branch block)   ? Non-ischemic cardiomyopathy ([place])   ? Per Records from [place]   ? Shortness of breath   ? Per Records from [place]   ? Syncope 05/30/2015  ? ?Past Surgical History:  ?Procedure Laterality Date  ? CARDIAC CATHETERIZATION    ? COLONOSCOPY  08/30/2005  ?  Per Records from [place]   ? EP IMPLANTABLE DEVICE N/A 07/21/2015  ? STJ CRTD implanted by Dr Caryl Comes for NICM, CHF  ? LEFT AND RIGHT HEART CATHETERIZATION WITH CORONARY ANGIOGRAM N/A 04/09/2015  ? Procedure: LEFT AND RIGHT HEART CATHETERIZATION WITH CORONARY ANGIOGRAM;  Surgeon: Jacolyn Reedy, MD;  Location[facility] C[facility] LAB;  Service: Cardiovascular;  Laterality: N/A;  ? ORIF ELBOW FRACTURE Left 1981  ? SHOULDER SURGERY Left 05/2008  ? "had to put cadavear bone in and reattach muscle to it"  ? TONSILLECTOMY AND ADENOIDECTOMY   1960's  ? ?Social History: ?  reports that he quit smoking about 48 years ago. His smoking use included cigarettes. He started smoking about 51 years ago. He has a 2.00 pack-year smoking history. He has never used smokeless tobacco. He reports current alcohol use of about 4.0 standard drinks per week. He reports that he does not use drugs. ? ?Family History  ?Problem Relation Age of Onset  ? CVA Mother   ? High Cholesterol Mother   ?     Per Records from [place]   ? Cataracts Mother   ?     Per Records from [place]   ? High blood pressure Mother   ?     Per Records from [place]   ? Bone cancer Father   ? Diabetes Father   ? Colon polyps Father   ?     Precamcerous, Per Records from [place]   ? High blood pressure Sister   ? Colon cancer Neg Hx   ? Esophageal cancer Neg Hx   ? Rectal cancer Neg Hx   ? Stomach cancer Neg Hx   ? ? ?Medications: ?Patient's Medications  ?New Prescriptions  ? No medications on file  ?Previous Medications  ? CARVEDILOL (COREG) 12.5 MG TABLET    TAKE 1 TABLET BY MOUTH 2 (TWO) TIMES DAILY WITH A MEAL.  ? FUROSEMIDE (LASIX) 20 MG TABLET    TAKE 1 TABLET BY MOUTH EVERY DAY AS NEEDED  ? LORATADINE (CLARITIN) 10 MG TABLET    Take 10 mg by mouth daily as needed for allergies (Seasonal).  ? MULTIPLE VITAMINS-MINERALS (CENTRUM SILVER 50+MEN) TABS    Take by mouth.  ? SACUBITRIL-VALSARTAN (ENTRESTO) 49-51 MG    Take 1 tablet by mouth 2 (two) times daily.  ? SPIRONOLACTONE (ALDACTONE) 25 MG TABLET    TAKE 1 TABLET BY MOUTH EVERY DAY  ?Modified Medications  ? No medications on file  ?Discontinued Medications  ? AMOXICILLIN-CLAVULANATE (AUGMENTIN) 500-125 MG TABLET    Take 1 tablet by mouth 3 (three) times daily. Will end on Wednesday 10/22/21  ? ? ?Physical Exam: ? ?Vitals:  ? 03/12/22 0810  ?BP: 122/78  ?Pulse: 71  ?Temp: 97.8 ?F (36.6 ?C)  ?TempSrc: Temporal  ?SpO2: 98%  ?Weight: 234 lb (106.1 kg)  ?Height: 6' (1.829 m)  ? ?Body mass index is 31.74 kg/m?. ?Wt Readings  from Last 3 Encounters:  ?03/12/22 234 lb (106.1 kg)  ?10/22/21 227 lb (103 kg)  ?09/14/21 227 lb (103 kg)  ? ? ?Physical Exam ?Constitutional:   ?   Appearance: Normal appearance. He is obese.  ?HENT:  ?   Right Ear: Tympanic membrane, ear canal and external ear normal.  ?   Left Ear: Tympanic membrane, ear canal and external ear normal.  ?Cardiovascular:  ?   Rate and Rhythm: Normal rate and regular rhythm.  ?   Pulses: Normal pulses.  ?  Heart sounds: Normal heart sounds.  ?Pulmonary:  ?   Effort: Pulmonary effort is normal.  ?   Breath sounds: Normal breath sounds.  ?Abdominal:  ?   General: Bowel sounds are normal.  ?Musculoskeletal:     ?   General: Normal range of motion.  ?Skin: ?   General: Skin is warm and dry.  ?Neurological:  ?   Mental Status: He is alert and oriented to person, place, and time.  ?Psychiatric:     ?   Mood and Affect: Mood normal.     ?   Behavior: Behavior normal.     ?   Thought Content: Thought content normal.     ?   Judgment: Judgment normal.  ? ? ?Labs reviewed: ?Basic Metabolic Panel: ?Recent Labs  ?  07/14/21 ?1435  ?NA 140  ?K 3.9  ?CL 106  ?CO2 25  ?GLUCOSE 90  ?BUN 13  ?CREATININE 1.10  ?CALCIUM 9.6  ? ?Liver Function Tests: ?Recent Labs  ?  09/14/21 ?4854  ?AST 18  ?ALT 26  ?BILITOT 0.6  ?PROT 7.0  ? ?No results for input(s): LIPASE, AMYLASE in the last 8760 hours. ?No results for input(s): AMMONIA in the last 8760 hours. ?CBC: ?Recent Labs  ?  09/14/21 ?0905  ?WBC 6.9  ?NEUTROABS 3,616  ?HGB 15.5  ?HCT 46.7  ?MCV 89.5  ?PLT 331  ? ?Lipid Panel: ?Recent Labs  ?  07/14/21 ?1435  ?CHOL 154  ?HDL 50  ?[place]el[place] 86  ?TRIG 90  ?CHOLHDL 3.1  ? ?TSH: ?No results for input(s): TSH in the last 8760 hours. ?A1C: ?No results found for: HGBA1C ? ? ?Assessment/Plan ?1. Chronic systolic CHF (congestive heart failure) ([place]) ?- Takes Entresto- 49/51 mg tablet BID, Lasix 20 mg PRN, Spironolactone 25 mg once daily before bed ?- Stable no concerns. ?  ?2. Essential hypertension ?-Blood pressure  well controlled ?Continue current medications ?Follow metabolic panel ? ?3. Class 1 obesity due to excess calories with serious comorbidity and body mass index (BMI) of 30.0 to 30.9 in adult ?- Weight up by 7

## 2022-03-13 LAB — COMPLETE METABOLIC PANEL WITH GFR
AG Ratio: 1.7 (calc) (ref 1.0–2.5)
ALT: 20 U/L (ref 9–46)
AST: 17 U/L (ref 10–35)
Albumin: 4.4 g/dL (ref 3.6–5.1)
Alkaline phosphatase (APISO): 56 U/L (ref 35–144)
BUN: 17 mg/dL (ref 7–25)
CO2: 27 mmol/L (ref 20–32)
Calcium: 9.9 mg/dL (ref 8.6–10.3)
Chloride: 104 mmol/L (ref 98–110)
Creat: 1.07 mg/dL (ref 0.70–1.35)
Globulin: 2.6 g/dL (calc) (ref 1.9–3.7)
Glucose, Bld: 109 mg/dL — ABNORMAL HIGH (ref 65–99)
Potassium: 5 mmol/L (ref 3.5–5.3)
Sodium: 139 mmol/L (ref 135–146)
Total Bilirubin: 0.5 mg/dL (ref 0.2–1.2)
Total Protein: 7 g/dL (ref 6.1–8.1)
eGFR: 76 mL/min/{1.73_m2} (ref >=60)

## 2022-03-13 LAB — LIPID PANEL
Cholesterol: 138 mg/dL (ref <200)
HDL: 46 mg/dL (ref >=40)
LDL Cholesterol (Calc): 77 mg/dL (calc)
Non-HDL Cholesterol (Calc): 92 mg/dL (calc) (ref <130)
Total CHOL/HDL Ratio: 3 (calc) (ref <5.0)
Triglycerides: 72 mg/dL (ref <150)

## 2022-03-13 LAB — URIC ACID: Uric Acid, Serum: 6.1 mg/dL (ref 4.0–8.0)

## 2022-03-31 ENCOUNTER — Ambulatory Visit (INDEPENDENT_AMBULATORY_CARE_PROVIDER_SITE_OTHER): Payer: Medicare Other

## 2022-03-31 DIAGNOSIS — I428 Other cardiomyopathies: Secondary | ICD-10-CM

## 2022-03-31 LAB — CUP PACEART REMOTE DEVICE CHECK
Battery Remaining Longevity: 6 mo
Battery Remaining Percentage: 8 %
Battery Voltage: 2.66 V
Brady Statistic AP VP Percent: 24 %
Brady Statistic AP VS Percent: 1 %
Brady Statistic AS VP Percent: 76 %
Brady Statistic AS VS Percent: 1 %
Brady Statistic RA Percent Paced: 23 %
Date Time Interrogation Session: 20230411072749
HighPow Impedance: 74 Ohm
HighPow Impedance: 74 Ohm
Implantable Lead Implant Date: 20160801
Implantable Lead Implant Date: 20160801
Implantable Lead Implant Date: 20160801
Implantable Lead Location: 753858
Implantable Lead Location: 753859
Implantable Lead Location: 753860
Implantable Lead Model: 7122
Implantable Pulse Generator Implant Date: 20160801
Lead Channel Impedance Value: 1075 Ohm
Lead Channel Impedance Value: 430 Ohm
Lead Channel Impedance Value: 450 Ohm
Lead Channel Pacing Threshold Amplitude: 0.75 V
Lead Channel Pacing Threshold Amplitude: 1.25 V
Lead Channel Pacing Threshold Amplitude: 1.75 V
Lead Channel Pacing Threshold Pulse Width: 0.5 ms
Lead Channel Pacing Threshold Pulse Width: 0.5 ms
Lead Channel Pacing Threshold Pulse Width: 1.5 ms
Lead Channel Sensing Intrinsic Amplitude: 12 mV
Lead Channel Sensing Intrinsic Amplitude: 4.9 mV
Lead Channel Setting Pacing Amplitude: 1.75 V
Lead Channel Setting Pacing Amplitude: 2.5 V
Lead Channel Setting Pacing Amplitude: 2.5 V
Lead Channel Setting Pacing Pulse Width: 0.5 ms
Lead Channel Setting Pacing Pulse Width: 1.5 ms
Lead Channel Setting Sensing Sensitivity: 0.5 mV
Pulse Gen Serial Number: 7290135

## 2022-04-01 ENCOUNTER — Telehealth: Payer: Self-pay

## 2022-04-01 NOTE — Telephone Encounter (Signed)
Scheduled remote reviewed. Normal device function.   ?The device estimates 5.9 month until ERI, need to start IFU, sent to triage ?There was one 12 second atrial arrhythmia detected ?Next remote 91 days. ? ?Kathy Breach, RN, CCDS, [employer] ? ? ?Successful telephone encounter to patient to advise of monthly remotes for increased battery monitoring. Remotes have been scheduled in epic. Patient requested information on 12 second atrial arrhythmia noted on remote. Advised it was of no concern. All questions answered. Patient appreciative of call.  ? ? ? ? ? ?

## 2022-04-16 NOTE — Progress Notes (Signed)
Remote ICD transmission.   

## 2022-05-03 ENCOUNTER — Ambulatory Visit (INDEPENDENT_AMBULATORY_CARE_PROVIDER_SITE_OTHER): Payer: Medicare Other

## 2022-05-03 DIAGNOSIS — I428 Other cardiomyopathies: Secondary | ICD-10-CM

## 2022-05-04 LAB — CUP PACEART REMOTE DEVICE CHECK
Battery Remaining Longevity: 5 mo
Battery Remaining Percentage: 6 %
Battery Voltage: 2.63 V
Brady Statistic AP VP Percent: 23 %
Brady Statistic AP VS Percent: 1 %
Brady Statistic AS VP Percent: 76 %
Brady Statistic AS VS Percent: 1 %
Brady Statistic RA Percent Paced: 23 %
Date Time Interrogation Session: 20230515034415
HighPow Impedance: 73 Ohm
HighPow Impedance: 73 Ohm
Implantable Lead Implant Date: 20160801
Implantable Lead Implant Date: 20160801
Implantable Lead Implant Date: 20160801
Implantable Lead Location: 753858
Implantable Lead Location: 753859
Implantable Lead Location: 753860
Implantable Lead Model: 7122
Implantable Pulse Generator Implant Date: 20160801
Lead Channel Impedance Value: 1025 Ohm
Lead Channel Impedance Value: 390 Ohm
Lead Channel Impedance Value: 410 Ohm
Lead Channel Pacing Threshold Amplitude: 0.875 V
Lead Channel Pacing Threshold Amplitude: 1.25 V
Lead Channel Pacing Threshold Amplitude: 1.75 V
Lead Channel Pacing Threshold Pulse Width: 0.5 ms
Lead Channel Pacing Threshold Pulse Width: 0.5 ms
Lead Channel Pacing Threshold Pulse Width: 1.5 ms
Lead Channel Sensing Intrinsic Amplitude: 12 mV
Lead Channel Sensing Intrinsic Amplitude: 3.9 mV
Lead Channel Setting Pacing Amplitude: 1.875
Lead Channel Setting Pacing Amplitude: 2.5 V
Lead Channel Setting Pacing Amplitude: 2.5 V
Lead Channel Setting Pacing Pulse Width: 0.5 ms
Lead Channel Setting Pacing Pulse Width: 1.5 ms
Lead Channel Setting Sensing Sensitivity: 0.5 mV
Pulse Gen Serial Number: 7290135

## 2022-05-21 NOTE — Progress Notes (Signed)
Remote ICD transmission.   

## 2022-05-21 NOTE — Addendum Note (Signed)
Addended by: Cheri Kearns A on: 05/21/2022 09:04 AM   Modules accepted: Level of Service

## 2022-05-31 ENCOUNTER — Ambulatory Visit (INDEPENDENT_AMBULATORY_CARE_PROVIDER_SITE_OTHER): Payer: Medicare Other

## 2022-05-31 DIAGNOSIS — I428 Other cardiomyopathies: Secondary | ICD-10-CM

## 2022-06-01 LAB — CUP PACEART REMOTE DEVICE CHECK
Battery Remaining Longevity: 5 mo
Battery Remaining Percentage: 6 %
Battery Voltage: 2.63 V
Brady Statistic AP VP Percent: 23 %
Brady Statistic AP VS Percent: 1 %
Brady Statistic AS VP Percent: 76 %
Brady Statistic AS VS Percent: 1 %
Brady Statistic RA Percent Paced: 23 %
Date Time Interrogation Session: 20230612070407
HighPow Impedance: 79 Ohm
HighPow Impedance: 79 Ohm
Implantable Lead Implant Date: 20160801
Implantable Lead Implant Date: 20160801
Implantable Lead Implant Date: 20160801
Implantable Lead Location: 753858
Implantable Lead Location: 753859
Implantable Lead Location: 753860
Implantable Lead Model: 7122
Implantable Pulse Generator Implant Date: 20160801
Lead Channel Impedance Value: 1075 Ohm
Lead Channel Impedance Value: 380 Ohm
Lead Channel Impedance Value: 430 Ohm
Lead Channel Pacing Threshold Amplitude: 0.75 V
Lead Channel Pacing Threshold Amplitude: 1.25 V
Lead Channel Pacing Threshold Amplitude: 1.75 V
Lead Channel Pacing Threshold Pulse Width: 0.5 ms
Lead Channel Pacing Threshold Pulse Width: 0.5 ms
Lead Channel Pacing Threshold Pulse Width: 1.5 ms
Lead Channel Sensing Intrinsic Amplitude: 12 mV
Lead Channel Sensing Intrinsic Amplitude: 3.9 mV
Lead Channel Setting Pacing Amplitude: 1.75 V
Lead Channel Setting Pacing Amplitude: 2.5 V
Lead Channel Setting Pacing Amplitude: 2.5 V
Lead Channel Setting Pacing Pulse Width: 0.5 ms
Lead Channel Setting Pacing Pulse Width: 1.5 ms
Lead Channel Setting Sensing Sensitivity: 0.5 mV
Pulse Gen Serial Number: 7290135

## 2022-06-03 ENCOUNTER — Other Ambulatory Visit (HOSPITAL_COMMUNITY): Payer: Self-pay | Admitting: Cardiology

## 2022-06-21 NOTE — Progress Notes (Signed)
Remote ICD transmission.   

## 2022-06-21 NOTE — Addendum Note (Signed)
Addended by: Douglass Rivers D on: 06/21/2022 03:46 PM   Modules accepted: Level of Service

## 2022-06-28 ENCOUNTER — Ambulatory Visit (INDEPENDENT_AMBULATORY_CARE_PROVIDER_SITE_OTHER): Payer: Medicare Other

## 2022-06-28 DIAGNOSIS — I428 Other cardiomyopathies: Secondary | ICD-10-CM

## 2022-06-28 LAB — CUP PACEART REMOTE DEVICE CHECK
Battery Remaining Longevity: 5 mo
Battery Remaining Percentage: 6 %
Battery Voltage: 2.63 V
Brady Statistic AP VP Percent: 23 %
Brady Statistic AP VS Percent: 1 %
Brady Statistic AS VP Percent: 77 %
Brady Statistic AS VS Percent: 1 %
Brady Statistic RA Percent Paced: 23 %
Date Time Interrogation Session: 20230710075147
HighPow Impedance: 77 Ohm
HighPow Impedance: 77 Ohm
Implantable Lead Implant Date: 20160801
Implantable Lead Implant Date: 20160801
Implantable Lead Implant Date: 20160801
Implantable Lead Location: 753858
Implantable Lead Location: 753859
Implantable Lead Location: 753860
Implantable Lead Model: 7122
Implantable Pulse Generator Implant Date: 20160801
Lead Channel Impedance Value: 400 Ohm
Lead Channel Impedance Value: 440 Ohm
Lead Channel Impedance Value: 960 Ohm
Lead Channel Pacing Threshold Amplitude: 0.875 V
Lead Channel Pacing Threshold Amplitude: 1.25 V
Lead Channel Pacing Threshold Amplitude: 1.75 V
Lead Channel Pacing Threshold Pulse Width: 0.5 ms
Lead Channel Pacing Threshold Pulse Width: 0.5 ms
Lead Channel Pacing Threshold Pulse Width: 1.5 ms
Lead Channel Sensing Intrinsic Amplitude: 12 mV
Lead Channel Sensing Intrinsic Amplitude: 3.2 mV
Lead Channel Setting Pacing Amplitude: 1.875
Lead Channel Setting Pacing Amplitude: 2.5 V
Lead Channel Setting Pacing Amplitude: 2.5 V
Lead Channel Setting Pacing Pulse Width: 0.5 ms
Lead Channel Setting Pacing Pulse Width: 1.5 ms
Lead Channel Setting Sensing Sensitivity: 0.5 mV
Pulse Gen Serial Number: 7290135

## 2022-07-12 ENCOUNTER — Other Ambulatory Visit (HOSPITAL_COMMUNITY): Payer: Self-pay | Admitting: *Deleted

## 2022-07-12 MED ORDER — SPIRONOLACTONE 25 MG PO TABS: 25.0000 mg | ORAL_TABLET | Freq: Every day | ORAL | 3 refills | Status: DC

## 2022-07-26 ENCOUNTER — Ambulatory Visit: Payer: Medicare Other

## 2022-07-26 DIAGNOSIS — I428 Other cardiomyopathies: Secondary | ICD-10-CM

## 2022-07-27 LAB — CUP PACEART REMOTE DEVICE CHECK
Battery Remaining Longevity: 4 mo
Battery Remaining Percentage: 4 %
Battery Voltage: 2.62 V
Brady Statistic AP VP Percent: 23 %
Brady Statistic AP VS Percent: 1 %
Brady Statistic AS VP Percent: 77 %
Brady Statistic AS VS Percent: 1 %
Brady Statistic RA Percent Paced: 22 %
Date Time Interrogation Session: 20230807072134
HighPow Impedance: 81 Ohm
HighPow Impedance: 81 Ohm
Implantable Lead Implant Date: 20160801
Implantable Lead Implant Date: 20160801
Implantable Lead Implant Date: 20160801
Implantable Lead Location: 753858
Implantable Lead Location: 753859
Implantable Lead Location: 753860
Implantable Lead Model: 7122
Implantable Pulse Generator Implant Date: 20160801
Lead Channel Impedance Value: 1125 Ohm
Lead Channel Impedance Value: 410 Ohm
Lead Channel Impedance Value: 460 Ohm
Lead Channel Pacing Threshold Amplitude: 0.875 V
Lead Channel Pacing Threshold Amplitude: 1.25 V
Lead Channel Pacing Threshold Amplitude: 1.75 V
Lead Channel Pacing Threshold Pulse Width: 0.5 ms
Lead Channel Pacing Threshold Pulse Width: 0.5 ms
Lead Channel Pacing Threshold Pulse Width: 1.5 ms
Lead Channel Sensing Intrinsic Amplitude: 12 mV
Lead Channel Sensing Intrinsic Amplitude: 3.2 mV
Lead Channel Setting Pacing Amplitude: 1.875
Lead Channel Setting Pacing Amplitude: 2.5 V
Lead Channel Setting Pacing Amplitude: 2.5 V
Lead Channel Setting Pacing Pulse Width: 0.5 ms
Lead Channel Setting Pacing Pulse Width: 1.5 ms
Lead Channel Setting Sensing Sensitivity: 0.5 mV
Pulse Gen Serial Number: 7290135

## 2022-07-28 NOTE — Progress Notes (Signed)
Remote ICD transmission.   

## 2022-08-18 NOTE — Progress Notes (Unsigned)
Electrophysiology Office Note Date: 08/19/2022  ID:  Wu, Isaiah February 19, 1951, MRN [id number]  PCP: Lauree Chandler, NP Primary Cardiologist: None Electrophysiologist: Virl Axe, MD   CC: Routine ICD follow-up  Isaiah Wu is a 71 y.o. male seen today for Virl Axe, MD for routine electrophysiology followup.  Since last being seen in our clinic the patient reports doing well overall.  he denies chest pain, palpitations, dyspnea, PND, orthopnea, nausea, vomiting, dizziness, syncope, edema, weight gain, or early satiety. He has not had ICD shocks.   Device History: STJ CRTD implanted 2016 for NICM, CHF, LBBB History of appropriate therapy: No History of AAD therapy: No  Past Medical History:  Diagnosis Date   Actinic keratoses    Per Records from [place]    Cardiomyopathy ([place]) 3/54/5625   Chronic systolic CHF (congestive heart failure) (Isaiah Wu) 04/14/2015   a. s/p STJ CRTD b. EF normalized with CRT therapy   Colon polyp    Per Records from Isaiah Wu    Elevated PSA    Followed by Dr. 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   placed by Dr.McLean, Per Records from Isaiah Wu    LBBB (left bundle branch block)    Non-ischemic cardiomyopathy (Isaiah Wu)    Per Records from Isaiah Wu    Per Records from Isaiah Wu    Syncope 05/30/2015   Past Surgical History:  Procedure Laterality Date   CARDIAC CATHETERIZATION     COLONOSCOPY  08/30/2005   Per Records  from Isaiah Wu N/A 07/21/2015   STJ CRTD implanted by Dr Isaiah Wu for NICM, CHF   LEFT AND RIGHT HEART CATHETERIZATION WITH CORONARY ANGIOGRAM N/A 04/09/2015   Procedure: LEFT AND RIGHT HEART CATHETERIZATION WITH CORONARY ANGIOGRAM;  Surgeon: Jacolyn Reedy, MD;  Location: [facility] CATH LAB;  Service: Cardiovascular;  Laterality: N/A;   ORIF ELBOW FRACTURE Left 1981   SHOULDER SURGERY Left 05/2008   "had to put cadavear bone in and reattach muscle to it"   TONSILLECTOMY AND ADENOIDECTOMY  1960's    Current Outpatient Medications  Medication Sig Dispense Refill   carvedilol (COREG) 12.5 MG tablet TAKE 1 TABLET BY MOUTH 2 (TWO) TIMES DAILY WITH A MEAL. 180 tablet 3   furosemide (LASIX) 20 MG tablet Take 1 tablet (20 mg total) by mouth daily as needed. NEED FOLLOW UP APPOINTMENT FOR ANYMORE REFILLS 90 tablet 0   loratadine (CLARITIN) 10 MG tablet Take 10 mg by mouth daily as needed for allergies (Seasonal).     Multiple Vitamins-Minerals (CENTRUM SILVER 50+MEN) TABS Take by mouth.     sacubitril-valsartan (ENTRESTO) 49-51 MG Take 1 tablet by mouth 2 (two) times daily. 180 tablet 3   spironolactone (ALDACTONE) 25 MG tablet Take 1 tablet (25 mg total) by mouth daily. 90 tablet 3   No current facility-administered medications for this visit.  Allergies:   Patient has no known allergies.   Social History: Social History   Socioeconomic History   Marital status: Married    Spouse name: Not on file   Number of children: Not on file   Years of education: Not on file   Highest education level: Not on file  Occupational History   Not on file  Tobacco Use   Smoking status: Former    Packs/day: 1.00    Years: 2.00    Total pack years: 2.00    Types: Cigarettes    Start date: 12/20/1970    Quit date: 12/20/1973    Years since quitting: 48.6   Smokeless tobacco: Never   Tobacco comments:    'quit smoking in the 1970's"  Vaping Use   Vaping Use: Never used   Substance and Sexual Activity   Alcohol use: Yes    Alcohol/week: 4.0 standard drinks of alcohol    Types: 4 Glasses of wine per week   Drug use: No   Sexual activity: Yes  Other Topics Concern   Not on file  Social History Narrative   Diet No      Do you drink/eat things with caffeine Yes, 2 cups of coffee daily      Marital Status Yes What year were you married? 1976      Do you live in a house, apartment, assisted living, condo, trailer, etc.? House      Is it one or more stories? Yes      How many persons live in your home? 2         Do you have any pets in your home?(please list) No      Highest level of education completed: Associate      Current or past profession:Supply chain executive-Now volunteering      Do you exercise?: Yes  Type and how often: Ellipitical cycling 5 days a week      Do you have a Living Will? No      Do you have a DNR form?  No       If not, would you like to discuss one? Yes      Do you have signed POA/HPOA forms? No      Do you have difficulty bathing or dressing yourself? No      Do you have difficulty preparing food or eating? No      Do you have difficulty managing medications? No      Do you have difficulty managing your finances? No      Do you have difficulty affording your medications? No                  Social Determinants of Radio broadcast assistant Strain: Not on file  Food Insecurity: Not on file  Transportation Needs: Not on file  Physical Activity: Not on file  Stress: Not on file  Social Connections: Not on file  Intimate Partner Violence: Not on file    Family History: Family History  Problem Relation Age of Onset   CVA Mother    High Cholesterol Mother        Per Records from [place] Mother        Per Records from [place]    High blood pressure Mother        Per Records from [place]    Bone cancer Father    Diabetes Father    Colon polyps  Father         Precamcerous, Per Records from [place]    High blood pressure Sister    Colon cancer Neg Hx    Esophageal cancer Neg Hx    Rectal cancer Neg Hx    Stomach cancer Neg Hx     Review of Systems: All other systems reviewed and are otherwise negative except as noted above.   Physical Exam: Vitals:   08/19/22 0801  BP: 112/68  Pulse: 69  SpO2: 97%  Weight: 238 lb 3.2 oz (108 kg)  Height: 6' (1.829 m)     GEN- The patient is well appearing, alert and oriented x 3 today.   HEENT: normocephalic, atraumatic; sclera clear, conjunctiva pink; hearing intact; oropharynx clear; neck supple, no JVP Lymph- no cervical lymphadenopathy Lungs- Clear to ausculation bilaterally, normal work of breathing.  No wheezes, rales, rhonchi Heart- Regular rate and rhythm, no murmurs, rubs or gallops, PMI not laterally displaced GI- soft, non-tender, non-distended, bowel sounds present, no hepatosplenomegaly Extremities- no clubbing or cyanosis. No edema; DP/PT/radial pulses 2+ bilaterally MS- no significant deformity or atrophy Skin- warm and dry, no rash or lesion; ICD pocket well healed Psych- euthymic mood, full affect Neuro- strength and sensation are intact  ICD interrogation- reviewed in detail today,  See PACEART report  EKG:  EKG is not ordered today.  Recent Labs: 09/14/2021: Hemoglobin 15.5; Platelets 331 03/12/2022: ALT 20; BUN 17; Creat 1.07; Potassium 5.0; Sodium 139   Wt Readings from Last 3 Encounters:  08/19/22 238 lb 3.2 oz (108 kg)  03/12/22 234 lb (106.1 kg)  10/22/21 227 lb (103 kg)     Other studies Reviewed: Additional studies/ records that were reviewed today include: Previous EP office notes.   Assessment and Plan:  1.  Chronic systolic dysfunction s/p [employer] CRT-D  EF normalized 06/2021 Echo @ 50-55% euvolemic today Stable on an appropriate medical regimen Normal ICD function See [facility] report No changes today His device is nearing ERI in 0-3 months.  Explained risks, benefits, and alternatives to generator change including but not limited to bleeding and infection.  Pt verbalized understanding and agrees to proceed once meets ERI.   If occurs within next several months, should be able to proceed without additional office visit.   Will need appropriately timed EKG and labs.    Current medicines are reviewed at length with the patient today.    Disposition:   Follow up with Dr. Caryl Wu in as usual post gen change    Signed, Shirley Friar, PA-C  08/19/2022 8:05 AM  [address] [place] [address] [phone] (office) [phone] (fax)

## 2022-08-19 ENCOUNTER — Encounter: Payer: Self-pay | Admitting: Student

## 2022-08-19 ENCOUNTER — Ambulatory Visit: Payer: Medicare Other | Attending: Student | Admitting: Student

## 2022-08-19 VITALS — BP 112/68 | HR 69 | Ht 72.0 in | Wt 238.2 lb

## 2022-08-19 DIAGNOSIS — I428 Other cardiomyopathies: Secondary | ICD-10-CM | POA: Diagnosis not present

## 2022-08-19 DIAGNOSIS — I447 Left bundle-branch block, unspecified: Secondary | ICD-10-CM | POA: Diagnosis not present

## 2022-08-19 LAB — CUP PACEART INCLINIC DEVICE CHECK
Battery Remaining Longevity: 2 mo
Brady Statistic RA Percent Paced: 22 %
Brady Statistic RV Percent Paced: 99.69 %
Date Time Interrogation Session: 20230831083558
HighPow Impedance: 76.5 Ohm
Implantable Lead Implant Date: 20160801
Implantable Lead Implant Date: 20160801
Implantable Lead Implant Date: 20160801
Implantable Lead Location: 753858
Implantable Lead Location: 753859
Implantable Lead Location: 753860
Implantable Lead Model: 7122
Implantable Pulse Generator Implant Date: 20160801
Lead Channel Impedance Value: 1075 Ohm
Lead Channel Impedance Value: 400 Ohm
Lead Channel Impedance Value: 462.5 Ohm
Lead Channel Pacing Threshold Amplitude: 0.875 V
Lead Channel Pacing Threshold Amplitude: 1.25 V
Lead Channel Pacing Threshold Amplitude: 1.25 V
Lead Channel Pacing Threshold Amplitude: 1.75 V
Lead Channel Pacing Threshold Amplitude: 1.75 V
Lead Channel Pacing Threshold Pulse Width: 0.5 ms
Lead Channel Pacing Threshold Pulse Width: 0.6 ms
Lead Channel Pacing Threshold Pulse Width: 0.6 ms
Lead Channel Pacing Threshold Pulse Width: 1.5 ms
Lead Channel Pacing Threshold Pulse Width: 1.5 ms
Lead Channel Sensing Intrinsic Amplitude: 12 mV
Lead Channel Sensing Intrinsic Amplitude: 2.3 mV
Lead Channel Setting Pacing Amplitude: 1.875
Lead Channel Setting Pacing Amplitude: 2.5 V
Lead Channel Setting Pacing Amplitude: 2.5 V
Lead Channel Setting Pacing Pulse Width: 0.6 ms
Lead Channel Setting Pacing Pulse Width: 1.5 ms
Lead Channel Setting Sensing Sensitivity: 0.5 mV
Pulse Gen Serial Number: 7290135

## 2022-08-19 NOTE — Patient Instructions (Signed)
Medication Instructions:  Your physician recommends that you continue on your current medications as directed. Please refer to the Current Medication list given to you today.  *If you need a refill on your cardiac medications before your next appointment, please call your pharmacy*   Lab Work: None If you have labs (blood work) drawn today and your tests are completely normal, you will receive your results only by: [place] (if you have MyChart) OR A paper copy in the mail If you have any lab test that is abnormal or we need to change your treatment, we will call you to review the results.   Follow-Up: At [facility], you and your health needs are our priority.  As part of our continuing mission to provide you with exceptional heart care, we have created designated Provider Care Teams.  These Care Teams include your primary Cardiologist (physician) and Advanced Practice Providers (APPs -  Physician Assistants and Nurse Practitioners) who all work together to provide you with the care you need, when you need it.   Your next appointment:   11/10/2022

## 2022-08-25 NOTE — Progress Notes (Signed)
Remote ICD transmission.   

## 2022-09-01 ENCOUNTER — Other Ambulatory Visit (HOSPITAL_COMMUNITY): Payer: Self-pay | Admitting: Cardiology

## 2022-09-07 ENCOUNTER — Telehealth: Payer: Self-pay | Admitting: Internal Medicine

## 2022-09-07 DIAGNOSIS — I5022 Chronic systolic (congestive) heart failure: Secondary | ICD-10-CM

## 2022-09-07 DIAGNOSIS — Z01812 Encounter for preprocedural laboratory examination: Secondary | ICD-10-CM

## 2022-09-07 DIAGNOSIS — Z95810 Presence of automatic (implantable) cardiac defibrillator: Secondary | ICD-10-CM

## 2022-09-07 DIAGNOSIS — I428 Other cardiomyopathies: Secondary | ICD-10-CM

## 2022-09-07 NOTE — Telephone Encounter (Signed)
Patient states he was notified he will need a gen change soon and he would like to go ahead and schedule.

## 2022-09-07 NOTE — Telephone Encounter (Signed)
Attempted phone call to pt and left voicemail message to contact [phone].

## 2022-09-08 NOTE — Telephone Encounter (Signed)
Spoke with pt  and advised his device is now at [facility] and will need to schedule device generator change.  Pt desires to schedule for Friday, 09/24/2022.  Letter placed on MyChart and hard copy placed up front with surgical scrub for pt to pick up at lab appointment scheduled for 09/17/2022. Reviewed Instruction letter with pt.  See letter for complete details.  Pt verbalizes understanding and agrees with current plan.

## 2022-09-08 NOTE — Telephone Encounter (Signed)
Isaiah Wu, yes I received the alert this am.

## 2022-09-08 NOTE — Telephone Encounter (Signed)
Patient returned RN's call.  Best number to reach him is [phone].

## 2022-09-08 NOTE — Telephone Encounter (Signed)
[facility] alert received that device reached ERI. Sending due to previous note seen.

## 2022-09-13 ENCOUNTER — Encounter: Payer: Self-pay | Admitting: Nurse Practitioner

## 2022-09-13 ENCOUNTER — Ambulatory Visit (INDEPENDENT_AMBULATORY_CARE_PROVIDER_SITE_OTHER): Payer: Medicare Other | Admitting: Nurse Practitioner

## 2022-09-13 VITALS — BP 128/80 | HR 69 | Temp 96.6°F | Resp 20 | Ht 72.0 in | Wt 238.6 lb

## 2022-09-13 DIAGNOSIS — M25561 Pain in right knee: Secondary | ICD-10-CM

## 2022-09-13 DIAGNOSIS — I5022 Chronic systolic (congestive) heart failure: Secondary | ICD-10-CM | POA: Diagnosis not present

## 2022-09-13 DIAGNOSIS — L989 Disorder of the skin and subcutaneous tissue, unspecified: Secondary | ICD-10-CM

## 2022-09-13 DIAGNOSIS — E6609 Other obesity due to excess calories: Secondary | ICD-10-CM

## 2022-09-13 DIAGNOSIS — I10 Essential (primary) hypertension: Secondary | ICD-10-CM | POA: Diagnosis not present

## 2022-09-13 DIAGNOSIS — J302 Other seasonal allergic rhinitis: Secondary | ICD-10-CM

## 2022-09-13 DIAGNOSIS — Z6830 Body mass index (BMI) 30.0-30.9, adult: Secondary | ICD-10-CM

## 2022-09-13 MED ORDER — DICLOFENAC SODIUM 1 % EX GEL: 2.0000 g | Freq: Four times a day (QID) | CUTANEOUS | 1 refills | Status: DC

## 2022-09-13 NOTE — Progress Notes (Signed)
Careteam: Patient Care Team: Lauree Chandler, NP as PCP - General (Geriatric Medicine) Deboraha Sprang, MD as PCP - Electrophysiology (Cardiology) Jacolyn Reedy, MD as Consulting Physician (Cardiology) Larey Dresser, MD as Consulting Physician (Cardiology) Deboraha Sprang, MD as Consulting Physician (Cardiology)  PLACE OF SERVICE:  [facility] Directive information    No Known Allergies  Chief Complaint  Patient presents with   Medical Management of Chronic Issues    Patient presents today for a 6 month follow-up   Quality Metric Gaps    COVID#5     HPI: Patient is a 71 y.o. male for routine follow up.  Went back to work with [employer] working with [employer] and the aging.  Getting generator in his pacemaker replaced.   Right knee causing more discomfort. Limping occasionally,3/10. Noticeable more at times and sometimes does not notice.  Using icyhot wraps. No injury.   Seasonal allergies doing okay- able to stop medication in October.   Waits until the end of October for flu shot.   Review of Systems:  Review of Systems  Constitutional:  Negative for chills, fever and weight loss.  HENT:  Negative for tinnitus.   Respiratory:  Negative for cough, sputum production and shortness of breath.   Cardiovascular:  Negative for chest pain, palpitations and leg swelling.  Gastrointestinal:  Negative for abdominal pain, constipation, diarrhea and heartburn.  Genitourinary:  Negative for dysuria, frequency and urgency.  Musculoskeletal:  Positive for joint pain. Negative for back pain, falls and myalgias.  Skin: Negative.   Neurological:  Negative for dizziness and headaches.  Psychiatric/Behavioral:  Negative for depression and memory loss. The patient does not have insomnia.     Past Medical History:  Diagnosis Date   Actinic keratoses    Per Records from [place]    Cardiomyopathy ([place]) 0/25/8527   Chronic systolic CHF (congestive heart failure)  ([place]) 04/14/2015   a. s/p STJ CRTD b. EF normalized with CRT therapy   Colon polyp    Per Records from [facility]    Elevated PSA    Followed by Dr. 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   placed by Dr.McLean, Per Records from [facility]    LBBB (left bundle branch block)    Non-ischemic cardiomyopathy ([place])    Per Records from [place] of breath    Per Records from [facility]    Syncope 05/30/2015   Past Surgical History:  Procedure Laterality Date   CARDIAC CATHETERIZATION     COLONOSCOPY  08/30/2005   Per Records from [place] N/A 07/21/2015   STJ CRTD implanted by Dr Caryl Comes for NICM, CHF   LEFT AND RIGHT HEART CATHETERIZATION WITH CORONARY ANGIOGRAM N/A 04/09/2015   Procedure: LEFT AND RIGHT HEART CATHETERIZATION WITH CORONARY ANGIOGRAM;  Surgeon: Jacolyn Reedy, MD;  Location: [facility] CATH LAB;  Service: Cardiovascular;  Laterality: N/A;   ORIF ELBOW FRACTURE Left 1981   SHOULDER SURGERY Left 05/2008   "had to put cadavear bone in and reattach muscle to it"   TONSILLECTOMY AND ADENOIDECTOMY  1960's   Social History:   reports that he quit smoking about 48 years ago. His smoking use included cigarettes. He started smoking about 51 years ago. He has a 2.00 pack-year smoking history. He has never used smokeless tobacco. He reports current alcohol use of about 4.0 standard drinks of alcohol per week. He reports that he does not use drugs.  Family History  Problem  Relation Age of Onset   CVA Mother    High Cholesterol Mother        Per Records from [place] Mother        Per Records from [place]    High blood pressure Mother        Per Records from [place] cancer Father    Diabetes Father    Colon polyps Father        Precamcerous, Per Records from [place]    High blood pressure Sister    Colon cancer Neg Hx    Esophageal cancer Neg Hx    Rectal cancer Neg Hx    Stomach cancer Neg Hx     Medications: Patient's Medications  New Prescriptions   No medications on file  Previous Medications   CARVEDILOL (COREG) 12.5 MG TABLET    Take 1 tablet (12.5 mg total) by mouth 2 (two) times daily with a meal. NEEDS FOLLOW UP APPOINTMENT FOR MORE REFILLS   FUROSEMIDE (LASIX) 20 MG TABLET    TAKE 1 TABLET (20 MG TOTAL) BY MOUTH DAILY AS NEEDED. NEED FOLLOW UP APPOINTMENT FOR ANYMORE REFILLS   LORATADINE (CLARITIN) 10 MG TABLET    Take 10 mg by mouth daily as needed for allergies (Seasonal).   MULTIPLE VITAMINS-MINERALS (CENTRUM SILVER 50+MEN) TABS    Take by mouth.   SACUBITRIL-VALSARTAN (ENTRESTO) 49-51 MG    Take 1 tablet by mouth 2 (two) times daily.   SPIRONOLACTONE (ALDACTONE) 25 MG TABLET    Take 1 tablet (25 mg total) by mouth daily.  Modified Medications   No medications on file  Discontinued Medications   No medications on file    Physical Exam:  Vitals:   09/13/22 0817  BP: 128/80  Pulse: 69  Resp: 20  Temp: (!) 96.6 F (35.9 C)  SpO2: 98%  Weight: 238 lb 9.6 oz (108.2 kg)  Height: 6' (1.829 m)   Body mass index is 32.36 kg/m. Wt Readings from Last 3 Encounters:  09/13/22 238 lb 9.6 oz (108.2 kg)  08/19/22 238 lb 3.2 oz (108 kg)  03/12/22 234 lb (106.1 kg)    Physical Exam Constitutional:      General: He is not in acute distress.    Appearance: He is well-developed. He is not diaphoretic.  HENT:     Head: Normocephalic and atraumatic.     Right Ear: External ear  normal.     Left Ear: External ear normal.     Mouth/Throat:     Pharynx: No oropharyngeal exudate.  Eyes:     Conjunctiva/sclera: Conjunctivae normal.     Pupils: Pupils are equal, round, and reactive to light.  Cardiovascular:     Rate and Rhythm: Normal rate and regular rhythm.     Heart sounds: Normal heart sounds.  Pulmonary:  Effort: Pulmonary effort is normal.     Breath sounds: Normal breath sounds.  Abdominal:     General: Bowel sounds are normal.     Palpations: Abdomen is soft.  Musculoskeletal:        General: No tenderness.     Cervical back: Normal range of motion and neck supple.     Right knee: Normal.     Left knee: Normal.     Right lower leg: Normal. No edema.     Left lower leg: Normal. No edema.  Skin:    General: Skin is warm and dry.  Neurological:     Mental Status: He is alert and oriented to person, place, and time.     Labs reviewed: Basic Metabolic Panel: Recent Labs    03/12/22 0931  NA 139  K 5.0  CL 104  CO2 27  GLUCOSE 109*  BUN 17  CREATININE 1.07  CALCIUM 9.9   Liver Function Tests: Recent Labs    09/14/21 0905 03/12/22 0931  AST 18 17  ALT 26 20  BILITOT 0.6 0.5  PROT 7.0 7.0   No results for input(s): "LIPASE", "AMYLASE" in the last 8760 hours. No results for input(s): "AMMONIA" in the last 8760 hours. CBC: Recent Labs    09/14/21 0905  WBC 6.9  NEUTROABS 3,616  HGB 15.5  HCT 46.7  MCV 89.5  PLT 331   Lipid Panel: Recent Labs    03/12/22 0931  CHOL 138  HDL 46  LDLCALC 77  TRIG 72  CHOLHDL 3.0   TSH: No results for input(s): "TSH" in the last 8760 hours. A1C: No results found for: "HGBA1C"   Assessment/Plan 1. Chronic systolic CHF (congestive heart failure) (HCC) Euvolemic- followed by cardiology, continues on lasix and entresto with aldactone.  -will follow up CMP  2. Essential hypertension -Blood pressure well controlled, goal bp <140/90 Continue current medications and dietary  modifications follow metabolic panel - CBC with Differential/Platelet - CMP with eGFR([facility])  3. Class 1 obesity due to excess calories with serious comorbidity and body mass index (BMI) of 30.0 to 30.9 in adult --education provided on healthy weight loss through increase in physical activity and proper nutrition   4. Seasonal allergies Controlled on claritin  5. Acute pain of right knee -minimal pain but he is aware of a discomfort at times.  -no swelling or heat -continue to use ICE PRN and Voltaren gel PRN - diclofenac Sodium (VOLTAREN) 1 % GEL; Apply 2 g topically 4 (four) times daily.  Dispense: 150 g; Refill: 1  6. Skin lesion -several different moles to face, some Seborrheic keratosis. Will refer to dermatology for skin surveillance.  - Ambulatory referral to Dermatology   Return in about 6 months (around 03/14/2023) for routine follow up . Carlos American. [place], [facility] [phone]

## 2022-09-14 LAB — COMPLETE METABOLIC PANEL WITH GFR
AG Ratio: 1.7 (calc) (ref 1.0–2.5)
ALT: 18 U/L (ref 9–46)
AST: 14 U/L (ref 10–35)
Albumin: 4.4 g/dL (ref 3.6–5.1)
Alkaline phosphatase (APISO): 65 U/L (ref 35–144)
BUN: 16 mg/dL (ref 7–25)
CO2: 20 mmol/L (ref 20–32)
Calcium: 9.8 mg/dL (ref 8.6–10.3)
Chloride: 108 mmol/L (ref 98–110)
Creat: 1.18 mg/dL (ref 0.70–1.35)
Globulin: 2.6 g/dL (calc) (ref 1.9–3.7)
Glucose, Bld: 122 mg/dL — ABNORMAL HIGH (ref 65–99)
Potassium: 4.3 mmol/L (ref 3.5–5.3)
Sodium: 141 mmol/L (ref 135–146)
Total Bilirubin: 0.6 mg/dL (ref 0.2–1.2)
Total Protein: 7 g/dL (ref 6.1–8.1)
eGFR: 67 mL/min/{1.73_m2} (ref >=60)

## 2022-09-14 LAB — CBC WITH DIFFERENTIAL/PLATELET
Absolute Monocytes: 803 cells/uL (ref 200–950)
Basophils Absolute: 82 cells/uL (ref 0–200)
Basophils Relative: 0.8 %
Eosinophils Absolute: 288 cells/uL (ref 15–500)
Eosinophils Relative: 2.8 %
HCT: 46.3 % (ref 38.5–50.0)
Hemoglobin: 16.1 g/dL (ref 13.2–17.1)
Lymphs Abs: 4594 cells/uL — ABNORMAL HIGH (ref 850–3900)
MCH: 31 pg (ref 27.0–33.0)
MCHC: 34.8 g/dL (ref 32.0–36.0)
MCV: 89 fL (ref 80.0–100.0)
MPV: 10.4 fL (ref 7.5–12.5)
Monocytes Relative: 7.8 %
Neutro Abs: 4532 cells/uL (ref 1500–7800)
Neutrophils Relative %: 44 %
Platelets: 301 10*3/uL (ref 140–400)
RBC: 5.2 10*6/uL (ref 4.20–5.80)
RDW: 13 % (ref 11.0–15.0)
Total Lymphocyte: 44.6 %
WBC: 10.3 10*3/uL (ref 3.8–10.8)

## 2022-09-17 ENCOUNTER — Other Ambulatory Visit: Payer: Medicare Other

## 2022-09-20 ENCOUNTER — Other Ambulatory Visit: Payer: Self-pay

## 2022-09-20 DIAGNOSIS — R7309 Other abnormal glucose: Secondary | ICD-10-CM

## 2022-09-21 LAB — CUP PACEART REMOTE DEVICE CHECK
Battery Remaining Longevity: 0 mo
Battery Voltage: 2.59 V
Brady Statistic AP VP Percent: 13 %
Brady Statistic AP VS Percent: 1 %
Brady Statistic AS VP Percent: 87 %
Brady Statistic AS VS Percent: 1 %
Brady Statistic RA Percent Paced: 12 %
Date Time Interrogation Session: 20231003023405
HighPow Impedance: 71 Ohm
HighPow Impedance: 71 Ohm
Implantable Lead Implant Date: 20160801
Implantable Lead Implant Date: 20160801
Implantable Lead Implant Date: 20160801
Implantable Lead Location: 753858
Implantable Lead Location: 753859
Implantable Lead Location: 753860
Implantable Lead Model: 7122
Implantable Pulse Generator Implant Date: 20160801
Lead Channel Impedance Value: 1025 Ohm
Lead Channel Impedance Value: 410 Ohm
Lead Channel Impedance Value: 410 Ohm
Lead Channel Pacing Threshold Amplitude: 1 V
Lead Channel Pacing Threshold Amplitude: 1.25 V
Lead Channel Pacing Threshold Amplitude: 1.75 V
Lead Channel Pacing Threshold Pulse Width: 0.5 ms
Lead Channel Pacing Threshold Pulse Width: 0.6 ms
Lead Channel Pacing Threshold Pulse Width: 1.5 ms
Lead Channel Sensing Intrinsic Amplitude: 12 mV
Lead Channel Sensing Intrinsic Amplitude: 3.4 mV
Lead Channel Setting Pacing Amplitude: 2 V
Lead Channel Setting Pacing Amplitude: 2.5 V
Lead Channel Setting Pacing Amplitude: 2.5 V
Lead Channel Setting Pacing Pulse Width: 0.6 ms
Lead Channel Setting Pacing Pulse Width: 1.5 ms
Lead Channel Setting Sensing Sensitivity: 0.5 mV
Pulse Gen Serial Number: 7290135

## 2022-09-23 NOTE — Pre-Procedure Instructions (Signed)
Attempted to call patients regarding procedure instructions for tomorrow.   Left voice with the following items: Arrival time 0830 Nothing to eat or drink after midnight No meds AM of procedure Responsible person to drive you home and stay with you for 24 hrs Wash with special soap night before and morning of procedure

## 2022-09-24 ENCOUNTER — Ambulatory Visit (HOSPITAL_COMMUNITY)
Admission: RE | Admit: 2022-09-24 | Discharge: 2022-09-24 | Disposition: A | Discharge status: Home / Self Care | Payer: Medicare Other | Attending: Internal Medicine | Admitting: Internal Medicine

## 2022-09-24 ENCOUNTER — Other Ambulatory Visit: Payer: Self-pay

## 2022-09-24 ENCOUNTER — Ambulatory Visit (HOSPITAL_COMMUNITY)
Admission: RE | Disposition: A | Discharge status: Home / Self Care | Payer: Self-pay | Source: Home / Self Care | Attending: Internal Medicine

## 2022-09-24 ENCOUNTER — Other Ambulatory Visit: Payer: Medicare Other

## 2022-09-24 ENCOUNTER — Encounter (HOSPITAL_COMMUNITY): Payer: Self-pay | Admitting: Internal Medicine

## 2022-09-24 DIAGNOSIS — I442 Atrioventricular block, complete: Secondary | ICD-10-CM | POA: Insufficient documentation

## 2022-09-24 DIAGNOSIS — I5022 Chronic systolic (congestive) heart failure: Secondary | ICD-10-CM | POA: Insufficient documentation

## 2022-09-24 DIAGNOSIS — Z4502 Encounter for adjustment and management of automatic implantable cardiac defibrillator: Secondary | ICD-10-CM | POA: Diagnosis not present

## 2022-09-24 DIAGNOSIS — I429 Cardiomyopathy, unspecified: Secondary | ICD-10-CM | POA: Diagnosis not present

## 2022-09-24 DIAGNOSIS — Z87891 Personal history of nicotine dependence: Secondary | ICD-10-CM | POA: Diagnosis not present

## 2022-09-24 DIAGNOSIS — I428 Other cardiomyopathies: Secondary | ICD-10-CM | POA: Insufficient documentation

## 2022-09-24 DIAGNOSIS — I110 Hypertensive heart disease with heart failure: Secondary | ICD-10-CM | POA: Insufficient documentation

## 2022-09-24 HISTORY — PX: BIV ICD GENERATOR CHANGEOUT: EP1194

## 2022-09-24 SURGERY — BIV ICD GENERATOR CHANGEOUT

## 2022-09-24 MED ORDER — SODIUM CHLORIDE 0.9 % IV SOLN
80.0000 mg | INTRAVENOUS | Status: AC
  Administered 2022-09-24: 80 mg

## 2022-09-24 MED ORDER — SODIUM CHLORIDE 0.9 % IV SOLN: INTRAVENOUS | Status: DC

## 2022-09-24 MED ORDER — CEFAZOLIN SODIUM-DEXTROSE 2-4 GM/100ML-% IV SOLN
2.0000 g | INTRAVENOUS | Status: AC
  Administered 2022-09-24: 2 g via INTRAVENOUS

## 2022-09-24 MED ORDER — CEFAZOLIN SODIUM-DEXTROSE 2-4 GM/100ML-% IV SOLN
INTRAVENOUS | Status: AC
  Filled 2022-09-24: qty 100

## 2022-09-24 MED ORDER — MIDAZOLAM HCL 5 MG/5ML IJ SOLN
INTRAMUSCULAR | Status: DC | PRN
  Administered 2022-09-24: 1 mg via INTRAVENOUS
  Administered 2022-09-24: 2 mg via INTRAVENOUS

## 2022-09-24 MED ORDER — POVIDONE-IODINE 10 % EX SWAB: 2.0000 | Freq: Once | CUTANEOUS | Status: DC

## 2022-09-24 MED ORDER — CHLORHEXIDINE GLUCONATE 4 % EX LIQD: 4.0000 | Freq: Once | CUTANEOUS | Status: DC

## 2022-09-24 MED ORDER — ACETAMINOPHEN 325 MG PO TABS: 325.0000 mg | ORAL_TABLET | ORAL | Status: DC | PRN

## 2022-09-24 MED ORDER — MIDAZOLAM HCL 5 MG/5ML IJ SOLN
INTRAMUSCULAR | Status: AC
  Filled 2022-09-24: qty 5

## 2022-09-24 MED ORDER — SODIUM CHLORIDE 0.9 % IV SOLN
INTRAVENOUS | Status: AC
  Filled 2022-09-24: qty 2

## 2022-09-24 MED ORDER — FENTANYL CITRATE (PF) 100 MCG/2ML IJ SOLN
INTRAMUSCULAR | Status: AC
  Filled 2022-09-24: qty 2

## 2022-09-24 MED ORDER — LIDOCAINE HCL 1 % IJ SOLN
INTRAMUSCULAR | Status: AC
  Filled 2022-09-24: qty 20

## 2022-09-24 MED ORDER — LIDOCAINE HCL (PF) 1 % IJ SOLN
INTRAMUSCULAR | Status: DC | PRN
  Administered 2022-09-24: 60 mL

## 2022-09-24 MED ORDER — FENTANYL CITRATE (PF) 100 MCG/2ML IJ SOLN
INTRAMUSCULAR | Status: DC | PRN
  Administered 2022-09-24 (×2): 25 ug via INTRAVENOUS

## 2022-09-24 SURGICAL SUPPLY — 8 items
ASSURA CRTD CD3369-40C (ICD Generator) ×1 IMPLANT
CABLE SURGICAL S-101-97-12 (CABLE) IMPLANT
DEFIB ASSURA CRT-D (ICD Generator) IMPLANT
DEVICE DISSECT PLASMABLAD 3.0S (MISCELLANEOUS) IMPLANT
PAD DEFIB RADIO PHYSIO CONN (PAD) IMPLANT
PIN PLUG IS-1 DEFIB (PIN) IMPLANT
PLASMABLADE 3.0S (MISCELLANEOUS) ×1
TRAY PACEMAKER INSERTION (PACKS) IMPLANT

## 2022-09-24 NOTE — H&P (Signed)
Patient Care Team: Lauree Chandler, NP as PCP - General (Geriatric Medicine) Deboraha Sprang, MD as PCP - Electrophysiology (Cardiology) Jacolyn Reedy, MD as Consulting Physician (Cardiology) Larey Dresser, MD as Consulting Physician (Cardiology) Deboraha Sprang, MD as Consulting Physician (Cardiology)   HPI  Isaiah Wu is a 71 y.o. male admitted for CRT-D generator replacement for battery at [facility]   Interval resolution of LV dysfunction   The patient denies chest pain, shortness of breath, nocturnal dyspnea, orthopnea or peripheral edema.  There have been no palpitations, lightheadedness or syncope. Marland Kitchen   DATE TEST EF    4/16 Echo  15%    4/16 LHC   CAs normal  2/17 Echo  45%    10/18 Echo  55%    7/22 Echo  50-55%      Date Cr K Hgb  7/22 1.10 3.9 15.5 (9/22)   9/23 1.18 4.3 16.1        Device History: STJ CRTD implanted 2016 for NICM, CHF, LBBB History of appropriate therapy: No History of AAD therapy: No   Records and Results Reviewed   Past Medical History:  Diagnosis Date   Actinic keratoses    Per Records from [place]    Cardiomyopathy ([place]) 0/17/4944   Chronic systolic CHF (congestive heart failure) ([place]) 04/14/2015   a. s/p STJ CRTD b. EF normalized with CRT therapy   Colon polyp    Per Records from [facility] [place]    Elevated PSA    Followed by Dr. 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   placed by Dr.McLean, Per Records from [place]    LBBB (left bundle branch  block)    Non-ischemic cardiomyopathy ([place])    Per Records from [place] of breath    Per Records from [place]    Syncope 05/30/2015    Past Surgical History:  Procedure Laterality Date   CARDIAC CATHETERIZATION     COLONOSCOPY  08/30/2005   Per Records from [place] N/A 07/21/2015   STJ CRTD implanted by Dr Caryl Comes for NICM, CHF   LEFT AND RIGHT HEART CATHETERIZATION WITH CORONARY ANGIOGRAM N/A 04/09/2015   Procedure: LEFT AND RIGHT HEART CATHETERIZATION WITH CORONARY ANGIOGRAM;  Surgeon: Jacolyn Reedy, MD;  Location: [facility] CATH LAB;  Service: Cardiovascular;  Laterality: N/A;   ORIF ELBOW FRACTURE Left 1981   SHOULDER SURGERY Left 05/2008   "had to put cadavear bone in and reattach muscle to it"   TONSILLECTOMY AND ADENOIDECTOMY  1960's    Current Facility-Administered Medications  Medication Dose Route Frequency Provider Last Rate Last Admin   0.9 %  sodium chloride infusion   Intravenous Continuous Deboraha Sprang, MD 50 mL/hr at 09/24/22 0905 New Bag at 09/24/22 0905   ceFAZolin (ANCEF) IVPB 2g/100 mL premix  2 g Intravenous On Call Virl Axe  C, MD       chlorhexidine (HIBICLENS) 4 % liquid 4 Application  4 Application Topical Once Deboraha Sprang, MD       gentamicin (GARAMYCIN) 80 mg in sodium chloride 0.9 % 500 mL irrigation  80 mg Irrigation On Call Deboraha Sprang, MD       povidone-iodine 10 % swab 2 Application  2 Application Topical Once Deboraha Sprang, MD        No Known Allergies    Social History   Tobacco Use   Smoking status: Former    Packs/day: 1.00    Years: 2.00    Total pack years: 2.00    Types: Cigarettes    Start date: 12/20/1970    Quit date: 12/20/1973    Years since quitting: 48.7   Smokeless tobacco: Never   Tobacco comments:    'quit smoking in the 1970's"  Vaping Use   Vaping Use: Never used  Substance Use Topics   Alcohol use: Yes    Alcohol/week: 4.0 standard drinks of  alcohol    Types: 4 Glasses of wine per week   Drug use: No     Family History  Problem Relation Age of Onset   CVA Mother    High Cholesterol Mother        Per Records from [place] Mother        Per Records from [place]    High blood pressure Mother        Per Records from [place]    Bone cancer Father    Diabetes Father    Colon polyps Father        Precamcerous, Per Records from [place]    High blood pressure Sister    Colon cancer Neg Hx    Esophageal cancer Neg Hx    Rectal cancer Neg Hx    Stomach cancer Neg Hx      Current Meds  Medication Sig   carvedilol (COREG) 12.5 MG tablet Take 1 tablet (12.5 mg total) by mouth 2 (two) times daily with a meal. NEEDS FOLLOW UP APPOINTMENT FOR MORE REFILLS   diclofenac Sodium (VOLTAREN) 1 % GEL Apply 2 g topically 4 (four) times daily. (Patient taking differently: Apply 2 g topically 4 (four) times daily as needed (pain).)   furosemide (LASIX) 20 MG tablet TAKE 1 TABLET (20 MG TOTAL) BY MOUTH DAILY AS NEEDED. NEED FOLLOW UP APPOINTMENT FOR ANYMORE REFILLS   loratadine (CLARITIN) 10 MG tablet Take 10 mg by mouth daily as needed for allergies (Seasonal).   Multiple Vitamins-Minerals (CENTRUM SILVER 50+MEN) TABS Take 1 tablet by mouth daily.   sacubitril-valsartan (ENTRESTO) 49-51 MG Take 1 tablet by mouth 2 (two) times daily.   spironolactone (ALDACTONE) 25 MG tablet Take 1 tablet (25 mg total) by mouth daily. (Patient taking differently: Take 25 mg by mouth at bedtime.)     Review of Systems negative except from HPI and PMH  Physical Exam BP 115/73   Pulse 69   Temp 97.7 F (36.5 C) (Temporal)   Resp 16   Ht '6\' 1"'$  (1.854 m)   Wt 104.8 kg   SpO2 95%   BMI 30.48 kg/m  Well developed and well nourished in no acute distress HENT normal E scleral and icterus clear Neck Supple JVP flat; carotids brisk and full Clear to ausculation Regular rate and rhythm, no murmurs gallops or  rub Soft with active bowel sounds No clubbing  cyanosis  Edema Alert and oriented, grossly normal motor and sensory function Skin Warm and Dry    Assessment and  Plan   Nonischemic cardiomyopathy interval near normalization   Congestive heart failure class II--diastolic    CRT-D -[place]   We have reviewed the benefits and risks of generator replacement.  These include but are not limited to lead fracture and infection.  The patient understands, agrees and is willing to proceed.

## 2022-09-24 NOTE — Discharge Instructions (Addendum)
Implantable Cardiac Device Battery Change, Care After  This sheet gives you information about how to care for yourself after your procedure. Your health care provider may also give you more specific instructions. If you have problems or questions, contact your health care provider. What can I expect after the procedure? After your procedure, it is common to have:  Pain or soreness at the site where the cardiac device was inserted.  Swelling at the site where the cardiac device was inserted.  You should received an information card for your new device in 4-8 weeks. Follow these instructions at home: Incision care   Keep the incision clean and dry. ? Do not take baths, swim, or use a hot tub until after your wound check.  ? Do not shower for at least 7 days, or as directed by your health care provider. ? Pat the area dry with a clean towel. Do not rub the area. This may cause bleeding.  Follow instructions from your health care provider about how to take care of your incision. Make sure you: ? Leave stitches (sutures), skin glue, or adhesive strips in place. These skin closures may need to stay in place for 2 weeks or longer. If adhesive strip edges start to loosen and curl up, you may trim the loose edges. Do not remove adhesive strips completely unless your health care provider tells you to do that.  Check your incision area every day for signs of infection. Check for: ? More redness, swelling, or pain. ? More fluid or blood. ? Warmth. ? Pus or a bad smell. Activity  Do not lift anything that is heavier than 10 lb (4.5 kg) until your health care provider says it is okay to do so.  For the first week, or as long as told by your health care provider: ? Avoid lifting your affected arm higher than your shoulder. ? After 1 week, Be gentle when you move your arms over your head. It is okay to raise your arm to comb your hair. ? Avoid strenuous exercise.  Ask your health care provider  when it is okay to: ? Resume your normal activities. ? Return to work or school. ? Resume sexual activity. Eating and drinking  Eat a heart-healthy diet. This should include plenty of fresh fruits and vegetables, whole grains, low-fat dairy products, and lean protein like chicken and fish.  Limit alcohol intake to no more than 1 drink a day for non-pregnant women and 2 drinks a day for men. One drink equals 12 oz of beer, 5 oz of wine, or 1 oz of hard liquor.  Check ingredients and nutrition facts on packaged foods and beverages. Avoid the following types of food: ? Food that is high in salt (sodium). ? Food that is high in saturated fat, like full-fat dairy or red meat. ? Food that is high in trans fat, like fried food. ? Food and drinks that are high in sugar. Lifestyle  Do not use any products that contain nicotine or tobacco, such as cigarettes and e-cigarettes. If you need help quitting, ask your health care provider.  Take steps to manage and control your weight.  Once cleared, get regular exercise. Aim for 150 minutes of moderate-intensity exercise (such as walking or yoga) or 75 minutes of vigorous exercise (such as running or swimming) each week.  Manage other health problems, such as diabetes or high blood pressure. Ask your health care provider how you can manage these conditions. General instructions  Do   on the area where the cardiac device was placed. If you need an MRI after your cardiac device has been placed, be sure to tell the health care provider who orders the MRI that you have a cardiac device. Avoid close and prolonged exposure to electrical devices that have strong magnetic fields. These include: Cell phones. Avoid keeping them in a pocket near the cardiac device, and try using the ear opposite the cardiac  device. MP3 players. Household appliances, like microwaves. Metal detectors. Electric generators. High-tension wires. Keep all follow-up visits as directed by your health care provider. This is important. Contact a health care provider if: You have pain at the incision site that is not relieved by over-the-counter or prescription medicines. You have any of these around your incision site or coming from it: More redness, swelling, or pain. Fluid or blood. Warmth to the touch. Pus or a bad smell. You have a fever. You feel brief, occasional palpitations, light-headedness, or any symptoms that you think might be related to your heart. Get help right away if: You experience chest pain that is different from the pain at the cardiac device site. You develop a red streak that extends above or below the incision site. You experience shortness of breath. You have palpitations or an irregular heartbeat. You have light-headedness that does not go away quickly. You faint or have dizzy spells. Your pulse suddenly drops or increases rapidly and does not return to normal. You begin to gain weight and your legs and ankles swell. Summary After your procedure, it is common to have pain, soreness, and some swelling where the cardiac device was inserted. Make sure to keep your incision clean and dry. Follow instructions from your health care provider about how to take care of your incision. Check your incision every day for signs of infection, such as more pain or swelling, pus or a bad smell, warmth, or leaking fluid and blood. Avoid strenuous exercise and lifting your left arm higher than your shoulder for 2 weeks, or as long as told by your health care provider. This information is not intended to replace advice given to you by your health care provider. Make sure you discuss any questions you have with your health care provider.   DERMABOND WILL COME OFF IN 10-14DAYS; IF NOT THEY WILL REMOVE AT  WOUND CHECK; KEEP DRY UNTIL TOMORROW EVENING; NO DRIVING FOR 4 DAYS; KEEP WOUND CHECK APPT

## 2022-09-26 MED FILL — Lidocaine HCl Local Inj 1%: INTRAMUSCULAR | Qty: 60 | Status: AC

## 2022-09-27 ENCOUNTER — Other Ambulatory Visit: Payer: Medicare Other

## 2022-10-01 ENCOUNTER — Other Ambulatory Visit: Payer: Medicare Other

## 2022-10-01 DIAGNOSIS — I10 Essential (primary) hypertension: Secondary | ICD-10-CM | POA: Diagnosis not present

## 2022-10-01 DIAGNOSIS — I5022 Chronic systolic (congestive) heart failure: Secondary | ICD-10-CM | POA: Diagnosis not present

## 2022-10-01 DIAGNOSIS — E6609 Other obesity due to excess calories: Secondary | ICD-10-CM | POA: Diagnosis not present

## 2022-10-01 DIAGNOSIS — Z6830 Body mass index (BMI) 30.0-30.9, adult: Secondary | ICD-10-CM | POA: Diagnosis not present

## 2022-10-02 LAB — COMPLETE METABOLIC PANEL WITH GFR
AG Ratio: 1.7 (calc) (ref 1.0–2.5)
ALT: 29 U/L (ref 9–46)
AST: 18 U/L (ref 10–35)
Albumin: 4.6 g/dL (ref 3.6–5.1)
Alkaline phosphatase (APISO): 64 U/L (ref 35–144)
BUN: 16 mg/dL (ref 7–25)
CO2: 24 mmol/L (ref 20–32)
Calcium: 9.9 mg/dL (ref 8.6–10.3)
Chloride: 105 mmol/L (ref 98–110)
Creat: 0.97 mg/dL (ref 0.70–1.35)
Globulin: 2.7 g/dL (calc) (ref 1.9–3.7)
Glucose, Bld: 147 mg/dL — ABNORMAL HIGH (ref 65–139)
Potassium: 4.7 mmol/L (ref 3.5–5.3)
Sodium: 139 mmol/L (ref 135–146)
Total Bilirubin: 0.6 mg/dL (ref 0.2–1.2)
Total Protein: 7.3 g/dL (ref 6.1–8.1)
eGFR: 85 mL/min/{1.73_m2} (ref >=60)

## 2022-10-02 LAB — LIPID PANEL
Cholesterol: 157 mg/dL (ref <200)
HDL: 50 mg/dL (ref >=40)
LDL Cholesterol (Calc): 82 mg/dL (calc)
Non-HDL Cholesterol (Calc): 107 mg/dL (calc) (ref <130)
Total CHOL/HDL Ratio: 3.1 (calc) (ref <5.0)
Triglycerides: 157 mg/dL — ABNORMAL HIGH (ref <150)

## 2022-10-05 ENCOUNTER — Encounter: Payer: Self-pay | Admitting: Nurse Practitioner

## 2022-10-05 NOTE — Telephone Encounter (Signed)
Message routed to PCP Eubanks, Jessica K, NP  

## 2022-10-05 NOTE — Progress Notes (Unsigned)
Cardiology Office Note Date:  10/05/2022  Patient ID:  Isaiah Wu 11-20-1951, MRN [id number] PCP:  Lauree Chandler, NP  Cardiologist:  Dr. [name] >> Dr. Aundra Dubin Electrophysiologist: Dr. Caryl Comes     Chief Complaint:  wound check   History of Present Illness: Isaiah Wu is a 71 y.o. male with history of NICM (unclear etiology, ?LBBB induced, c.MRI with uptate perhaps prior myocarditis), chronic CHF (systolic) with recovered LVEF, HTN, LBBB, GERD, CRT-D  He saw Dr. Caryl Comes 10/22/21, doing well, mentions: Cardiomyopathy remains significantly improved.  Discussed with [place] regarding multipoint pacing, and the use of a shared electrolyte ie M2 mitigates what ever predicted benefit there is from multipoint pacing.  Hence, we will inactivate one of the electrodes, reviewing the chest x-ray would like to maintain D1 as part of the system perhaps to coil  He saw Isaiah Wu 08/19/22, nearing ERI, discussed gen change procedure and planned for once reached.  Underwent gen change on 09/24/22  TODAY He is doing great. Healed up well, no concerns Denies any symptoms, no CP. No SOB No near syncope or syncope.  He has ever since he has had a device infrequent awareness of his heart beat for a few beats in his low/lateral chest/upper L abdomen. Not bothersome   Device information STJ CRTD implanted 07/21/2015, gen change 09/24/22 History of appropriate therapy: No History of AAD therapy: No   Past Medical History:  Diagnosis Date   Actinic keratoses    Per Records from [place]    Cardiomyopathy ([place]) 4/33/2951   Chronic systolic CHF (congestive heart failure) ([place]) 04/14/2015   a. s/p STJ CRTD b. EF normalized with CRT therapy   Colon polyp    Per Records from [place]    Elevated PSA    Followed by Dr. 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   placed by Dr.McLean, Per Records from [place]    LBBB (left bundle branch block)    Non-ischemic cardiomyopathy ([place])    Per Records from [place] of breath    Per Records from [place]    Syncope 05/30/2015    Past Surgical History:  Procedure Laterality Date   BIV ICD GENERATOR CHANGEOUT N/A 09/24/2022   Procedure: BIV ICD GENERATOR CHANGEOUT;  Surgeon: Deboraha Sprang, MD;  Location: [facility];  Service: Cardiovascular;  Laterality: N/A;   CARDIAC CATHETERIZATION     COLONOSCOPY  08/30/2005   Per Records from [place] N/A 07/21/2015   STJ CRTD implanted by Dr Caryl Comes for NICM, CHF   LEFT AND RIGHT HEART CATHETERIZATION WITH CORONARY ANGIOGRAM N/A 04/09/2015   Procedure: LEFT AND RIGHT HEART CATHETERIZATION WITH CORONARY ANGIOGRAM;  Surgeon: Jacolyn Reedy, MD;  Location: [facility] CATH LAB;  Service: Cardiovascular;  Laterality: N/A;   ORIF ELBOW FRACTURE Left 1981   SHOULDER SURGERY Left 05/2008   "  had to put cadavear bone in and reattach muscle to it"   TONSILLECTOMY AND ADENOIDECTOMY  1960's    Current Outpatient Medications  Medication Sig Dispense Refill   carvedilol (COREG) 12.5 MG tablet Take 1 tablet (12.5 mg total) by mouth 2 (two) times daily with a meal. NEEDS FOLLOW UP APPOINTMENT FOR MORE REFILLS 180 tablet 0   diclofenac Sodium (VOLTAREN) 1 % GEL Apply 2 g topically 4 (four) times daily. (Patient taking differently: Apply 2 g topically 4 (four) times daily as needed (pain).) 150 g 1   furosemide (LASIX) 20 MG tablet TAKE 1 TABLET (20 MG TOTAL) BY MOUTH  DAILY AS NEEDED. NEED FOLLOW UP APPOINTMENT FOR ANYMORE REFILLS 60 tablet 0   loratadine (CLARITIN) 10 MG tablet Take 10 mg by mouth daily as needed for allergies (Seasonal).     Multiple Vitamins-Minerals (CENTRUM SILVER 50+MEN) TABS Take 1 tablet by mouth daily.     sacubitril-valsartan (ENTRESTO) 49-51 MG Take 1 tablet by mouth 2 (two) times daily. 180 tablet 3   spironolactone (ALDACTONE) 25 MG tablet Take 1 tablet (25 mg total) by mouth daily. (Patient taking differently: Take 25 mg by mouth at bedtime.) 90 tablet 3   No current facility-administered medications for this visit.    Allergies:   Patient has no known allergies.   Social History:  The patient  reports that he quit smoking about 48 years ago. His smoking use included cigarettes. He started smoking about 51 years ago. He has a 2.00 pack-year smoking history. He has never used smokeless tobacco. He reports current alcohol use of about 4.0 standard drinks of alcohol per week. He reports that he does not use drugs.   Family History:  The patient's family history includes Bone cancer in his father; CVA in his mother; Cataracts in his mother; Colon polyps in his father; Diabetes in his father; High Cholesterol in his mother; High blood pressure in his mother and sister.  ROS:  Please see the history of present illness.    All other systems are reviewed and otherwise negative.   PHYSICAL EXAM:  VS:  There were no vitals taken for this visit. BMI: There is no height or weight on file to calculate BMI. Well nourished, well developed, in no acute distress HEENT: normocephalic, atraumatic Neck: no JVD, carotid bruits or masses Cardiac:  RRR; no significant murmurs, no rubs, or gallops Lungs:  CTA b/l, no wheezing, rhonchi or rales Abd: soft, nontender MS: no deformity or atrophy Ext: no edema Skin: warm and dry, no rash Neuro:  No gross deficits appreciated Psych: euthymic mood, full affect  ICD site is stable, dermabond has  already come off, wound edges are well approximated, no erythema, edema, heat, fluctuation, no tethering or discomfort   EKG:  done today and reviewed by myself SR/V paced, 74bpm, QRS 150, no change in morphology from prior   Device interrogation done today and reviewed by myself:  Battery and lead measurements are good No arrhythmias LV lead threshold 1.75-2.0, output is at 2.5V, not changed with some hx of what sounds like occasional diaphragmatic stim  07/14/2021: TTE 1. Left ventricular ejection fraction, by estimation, is 50 to 55%. The  left ventricle has low normal function. The left ventricle demonstrates  global hypokinesis. Left ventricular diastolic parameters are consistent  with Grade I diastolic dysfunction  (impaired relaxation).   2. Right ventricular systolic function is normal. The right ventricular  size is normal. There is normal pulmonary artery systolic pressure.  The  estimated right ventricular systolic pressure is 16.1 mmHg.   3. The mitral valve is normal in structure. Trivial mitral valve  regurgitation. No evidence of mitral stenosis.   4. The aortic valve is tricuspid. Aortic valve regurgitation is not  visualized. No aortic stenosis is present.   5. The inferior vena cava is normal in size with greater than 50%  respiratory variability, suggesting right atrial pressure of 3 mmHg.   Echo (4/16) with EF 15%, severe diffuse hypokinesis [name]).   RHC/LHC (4/16) with normal coronaries; mean RA 10, PA 58/35 mean 48, mean PCWP 32, no comment on CO.  HIV negative, TSH normal, ferritin normal, SPEP normal. S/p [employer] CRT-D 07/21/15.  - cMRI  04/2015 with severely dilated LV, EF 14%, septal-lateral dyssynchrony (prominent), normal RV size with mild to moderately decreased systolic function, at least moderate functional mitral regurgitation, there was basal inferoseptal subepicardial LGE (small area) and apical septal subtle mid-wall LGE (small area).   - Echo (9/16)  with EF 25-30%, mild MR => improved.  - CPX (1/17) with VE/VCO2 26.2, peak VO2 20.3, RER 1.13 => mild heart failure limitation.   - Echo (2/17) EF 40-45%.   - Echo (4/18) with EF 50-55% - Echo (10/18): EF 50-55%, mild RV dilation with normal systolic function.  - Echo (10/19): EF 45-50%, RV mildly dilated, normal systolic function.  - Echo (1/21): EF 45-50%, low normal RV function.    Recent Labs: 09/13/2022: Hemoglobin 16.1; Platelets 301 10/01/2022: ALT 29; BUN 16; Creat 0.97; Potassium 4.7; Sodium 139  10/01/2022: Cholesterol 157; HDL 50; LDL Cholesterol (Calc) 82; Total CHOL/HDL Ratio 3.1; Triglycerides 157   Estimated Creatinine Clearance: 91.4 mL/min (by C-G formula based on SCr of 0.97 mg/dL).   Wt Readings from Last 3 Encounters:  09/24/22 231 lb (104.8 kg)  09/13/22 238 lb 9.6 oz (108.2 kg)  08/19/22 238 lb 3.2 oz (108 kg)     Other studies reviewed: Additional studies/records reviewed today include: summarized above  ASSESSMENT AND PLAN:  1. CRT-D     Well healed wound, no signs of infection     Intact function     No programming changes made  2. NICM 3. Chronic CHF (systolic) with recovery of LVEF     No symptoms or exam findings of volume OL      CorVue wobbles up/down     On BB, entresto, furosemide and aldactone     C/w Dr. Aundra Dubin, the pt will call for an appt  4. HTN     Looks good    Disposition: f/u with Dr. Caryl Comes as scheduled, sooner if needed  Current medicines are reviewed at length with the patient today.  The patient did not have any concerns regarding medicines.  Venetia Night, PA-C 10/05/2022 6:23 PM     [facility] [address] [place] [address] [phone] (office)  [phone] (fax)

## 2022-10-06 ENCOUNTER — Ambulatory Visit: Payer: Medicare Other | Attending: Student | Admitting: Physician Assistant

## 2022-10-06 ENCOUNTER — Encounter: Payer: Self-pay | Admitting: Physician Assistant

## 2022-10-06 VITALS — BP 108/66 | HR 74 | Ht 73.0 in | Wt 237.0 lb

## 2022-10-06 DIAGNOSIS — I10 Essential (primary) hypertension: Secondary | ICD-10-CM | POA: Diagnosis not present

## 2022-10-06 DIAGNOSIS — I428 Other cardiomyopathies: Secondary | ICD-10-CM

## 2022-10-06 DIAGNOSIS — I5022 Chronic systolic (congestive) heart failure: Secondary | ICD-10-CM

## 2022-10-06 DIAGNOSIS — Z95810 Presence of automatic (implantable) cardiac defibrillator: Secondary | ICD-10-CM | POA: Diagnosis not present

## 2022-10-06 DIAGNOSIS — Z5189 Encounter for other specified aftercare: Secondary | ICD-10-CM

## 2022-10-06 LAB — CUP PACEART INCLINIC DEVICE CHECK
Battery Remaining Longevity: 66 mo
Brady Statistic RA Percent Paced: 29 %
Brady Statistic RV Percent Paced: 99.85 %
Date Time Interrogation Session: 20231018174023
HighPow Impedance: 68.625
Implantable Lead Implant Date: 20160801
Implantable Lead Implant Date: 20160801
Implantable Lead Implant Date: 20160801
Implantable Lead Location: 753858
Implantable Lead Location: 753859
Implantable Lead Location: 753860
Implantable Lead Model: 7122
Implantable Pulse Generator Implant Date: 20231006
Lead Channel Impedance Value: 1100 Ohm
Lead Channel Impedance Value: 425 Ohm
Lead Channel Impedance Value: 450 Ohm
Lead Channel Pacing Threshold Amplitude: 0.75 V
Lead Channel Pacing Threshold Amplitude: 0.75 V
Lead Channel Pacing Threshold Amplitude: 1.25 V
Lead Channel Pacing Threshold Amplitude: 1.25 V
Lead Channel Pacing Threshold Amplitude: 1.5 V
Lead Channel Pacing Threshold Amplitude: 1.5 V
Lead Channel Pacing Threshold Pulse Width: 0.5 ms
Lead Channel Pacing Threshold Pulse Width: 0.5 ms
Lead Channel Pacing Threshold Pulse Width: 0.6 ms
Lead Channel Pacing Threshold Pulse Width: 0.6 ms
Lead Channel Pacing Threshold Pulse Width: 1.5 ms
Lead Channel Pacing Threshold Pulse Width: 1.5 ms
Lead Channel Sensing Intrinsic Amplitude: 4.2 mV
Lead Channel Setting Pacing Amplitude: 2 V
Lead Channel Setting Pacing Amplitude: 2.5 V
Lead Channel Setting Pacing Amplitude: 2.5 V
Lead Channel Setting Pacing Pulse Width: 0.6 ms
Lead Channel Setting Pacing Pulse Width: 1.5 ms
Lead Channel Setting Sensing Sensitivity: 0.5 mV
Pulse Gen Serial Number: 8927074

## 2022-10-06 NOTE — Patient Instructions (Signed)
Medication Instructions:   Your physician recommends that you continue on your current medications as directed. Please refer to the Current Medication list given to you today.  *If you need a refill on your cardiac medications before your next appointment, please call your pharmacy*   Lab Work: [place]   If you have labs (blood work) drawn today and your tests are completely normal, you will receive your results only by: [place] (if you have MyChart) OR A paper copy in the mail If you have any lab test that is abnormal or we need to change your treatment, we will call you to review the results.   Testing/Procedures: NONE ORDERED  TODAY     Follow-Up: At [facility], you and your health needs are our priority.  As part of our continuing mission to provide you with exceptional heart care, we have created designated Provider Care Teams.  These Care Teams include your primary Cardiologist (physician) and Advanced Practice Providers (APPs -  Physician Assistants and Nurse Practitioners) who all work together to provide you with the care you need, when you need it.  We recommend signing up for the patient portal called "MyChart".  Sign up information is provided on this After Visit Summary.  MyChart is used to connect with patients for Virtual Visits (Telemedicine).  Patients are able to view lab/test results, encounter notes, upcoming appointments, etc.  Non-urgent messages can be sent to your provider as well.   To learn more about what you can do with MyChart, go to [email or web address].    Your next appointment:  AS SCHEDULED   The format for your next appointment:   In Person  Provider:   Virl Axe, MD    Other Instructions  Important Information About [place]

## 2022-10-07 DIAGNOSIS — H524 Presbyopia: Secondary | ICD-10-CM | POA: Diagnosis not present

## 2022-10-22 DIAGNOSIS — H2513 Age-related nuclear cataract, bilateral: Secondary | ICD-10-CM | POA: Diagnosis not present

## 2022-10-22 DIAGNOSIS — H2511 Age-related nuclear cataract, right eye: Secondary | ICD-10-CM | POA: Diagnosis not present

## 2022-10-28 ENCOUNTER — Other Ambulatory Visit (HOSPITAL_COMMUNITY): Payer: Self-pay | Admitting: Cardiology

## 2022-10-29 ENCOUNTER — Encounter: Payer: Self-pay | Admitting: Nurse Practitioner

## 2022-10-29 ENCOUNTER — Ambulatory Visit (SKILLED_NURSING_FACILITY): Payer: Medicare Other | Admitting: Nurse Practitioner

## 2022-10-29 DIAGNOSIS — Z0000 Encounter for general adult medical examination without abnormal findings: Secondary | ICD-10-CM | POA: Diagnosis not present

## 2022-10-29 NOTE — Progress Notes (Signed)
Subjective:   Ramiz Turpin is a 71 y.o. male who presents for Medicare Annual/Subsequent preventive examination.  Review of Systems     Cardiac Risk Factors include: obesity (BMI >30kg/m2);advanced age (>12mn, >>71women);hypertension;male gender     Objective:    There were no vitals filed for this visit. There is no height or weight on file to calculate BMI.     10/29/2022    8:55 AM 09/24/2022    8:50 AM 03/12/2022    8:34 AM 10/19/2021    8:30 AM 09/14/2021    8:32 AM 03/16/2021    8:40 AM 10/16/2020    8:44 AM  Advanced Directives  Does Patient Have a Medical Advance Directive? No No No No No No No  Does patient want to make changes to medical advance directive?   Yes (MAU/Ambulatory/Procedural Areas - Information given)      Would patient like information on creating a medical advance directive? No - Patient declined No - Patient declined  Yes (MAU/Ambulatory/Procedural Areas - Information given) Yes (MAU/Ambulatory/Procedural Areas - Information given) Yes (MAU/Ambulatory/Procedural Areas - Information given)     Current Medications (verified) Outpatient Encounter Medications as of 10/29/2022  Medication Sig   carvedilol (COREG) 12.5 MG tablet Take 1 tablet (12.5 mg total) by mouth 2 (two) times daily with a meal. NEEDS FOLLOW UP APPOINTMENT FOR MORE REFILLS   furosemide (LASIX) 20 MG tablet TAKE 1 TABLET (20 MG TOTAL) BY MOUTH DAILY AS NEEDED. NEED FOLLOW UP APPOINTMENT FOR ANYMORE REFILLS   Multiple Vitamins-Minerals (CENTRUM SILVER 50+MEN) TABS Take 1 tablet by mouth daily.   sacubitril-valsartan (ENTRESTO) 49-51 MG Take 1 tablet by mouth 2 (two) times daily.   spironolactone (ALDACTONE) 25 MG tablet Take 1 tablet (25 mg total) by mouth daily. (Patient taking differently: Take 25 mg by mouth at bedtime.)   [DISCONTINUED] diclofenac Sodium (VOLTAREN) 1 % GEL Apply 2 g topically 4 (four) times daily. (Patient taking differently: Apply 2 g topically as needed (pain).)    [DISCONTINUED] loratadine (CLARITIN) 10 MG tablet Take 10 mg by mouth daily as needed for allergies (Seasonal).   No facility-administered encounter medications on file as of 10/29/2022.    Allergies (verified) Patient has no known allergies.   History: Past Medical History:  Diagnosis Date   Actinic keratoses    [place] Records [place]   Cardiomy[place] 45/02/5464  Chronic systolic CHF (congestive heart failure) ([place] 04/14/2015   a. s/p STJ CRTD b. EF normalized with CRT therapy   Colon polyp    Per Records from [place]   Elevated PSA    Followed by Dr. 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   placed by Dr.McLean, [place] [facility] [place]   LBBB (left bundle branch block)    Non-ischemic cardiomyopathy ([place]    [place]    Per Records  from [place]    Syncope 05/30/2015   Past Surgical History:  Procedure Laterality Date   BIV ICD GENERATOR CHANGEOUT N/A 09/24/2022   Procedure: BIV ICD GENERATOR CHANGEOUT;  Surgeon: Deboraha Sprang, MD;  Location: [facility];  Service: Cardiovascular;  Laterality: N/A;   CARDIAC CATHETERIZATION     COLONOSCOPY  08/30/2005   Per Records from [place] N/A 07/21/2015   STJ CRTD implanted by Dr Caryl Comes for NICM, CHF   LEFT AND RIGHT HEART CATHETERIZATION WITH CORONARY ANGIOGRAM N/A 04/09/2015   Procedure: LEFT AND RIGHT HEART CATHETERIZATION WITH CORONARY ANGIOGRAM;   Surgeon: Jacolyn Reedy, MD;  Location: [facility] C[facility];  Service: Cardiovascular;  Laterality: N/A;   ORIF ELBOW FRACTURE Left 1981   SHOULDER SURGERY Left 05/2008   "had to put cadavear bone in and reattach muscle to it"   TONSILLECTOMY AND ADENOIDECTOMY  75's   Family History  Problem Relation Age of Onset   CVA Mother    High Cholesterol Mother        Per Records from [place] Mother        Per Records from [place]    High blood pressure Mother        Per Records from [place]    Bone cancer Father    Diabetes Father    Colon polyps Father        Precamcerous, Per Records from [place]    High blood pressure Sister    Colon cancer Neg Hx    Esophageal cancer Neg Hx    Rectal cancer Neg Hx    Stomach cancer Neg Hx    Social History   Socioeconomic History   Marital status: Married    Spouse name: Not on file   Number of children: Not on file   Years of education: Not on file   Highest education level: Not on file  Occupational History   Not on file  Tobacco Use   Smoking status: Former    Packs/day: 1.00    Years: 2.00    Total pack years: 2.00    Types: Cigarettes    Start date: 12/20/1970    Quit date: 12/20/1973    Years since quitting: 48.8   Smokeless tobacco: Never   Tobacco comments:    'quit smoking in the 1970's"  Vaping Use   Vaping Use: Never used  Substance and Sexual Activity   Alcohol use: Yes    Alcohol/week: 4.0 standard drinks of alcohol    Types: 4 Glasses of wine per week   Drug use: No   Sexual activity: Yes  Other Topics Concern   Not on file  Social History Narrative   Diet No      Do you drink/eat things with caffeine Yes, 2 cups of coffee daily      Marital Status Yes What year were you married? 1976      Do you live in a house, apartment, assisted living, condo, trailer, etc.? House      Is it one or more stories? Yes      How many persons live in your home? 2         Do you have  any pets in your home?(please list) No      Highest level of education completed: Associate      Current or past profession:Supply chain executive-Now volunteering  Do you exercise?: Yes  Type and how often: Ellipitical cycling 5 days a week      Do you have a Living Will? No      Do you have a DNR form?  No       If not, would you like to discuss one? Yes      Do you have signed POA/HPOA forms? No      Do you have difficulty bathing or dressing yourself? No      Do you have difficulty preparing food or eating? No      Do you have difficulty managing medications? No      Do you have difficulty managing your finances? No      Do you have difficulty affording your medications? No                  Social Determinants of Radio broadcast assistant Strain: Not on file  Food Insecurity: Not on file  Transportation Needs: Not on file  Physical Activity: Not on file  Stress: Not on file  Social Connections: Not on file    Tobacco Counseling Counseling given: Not Answered Tobacco comments: 'quit smoking in the 1970's"   Clinical Intake:  Pre-visit preparation completed: Yes  Pain : No/denies pain     BMI - recorded: 31 Diabetes: No  How often do you need to have someone help you when you read instructions, pamphlets, or other written materials from your doctor or pharmacy?: 1 - Never  Diabetic?no         Activities of Daily Living    10/29/2022    9:18 AM 10/29/2022    8:58 AM  In your present state of health, do you have any difficulty performing the following activities:  Hearing?  0  Vision?  1  Comment  is having eye surgery soon  Difficulty concentrating or making decisions?  0  Walking or climbing stairs?  0  Dressing or bathing?  0  Doing errands, shopping?  0  Preparing Food and eating ? N   Using the Toilet? N   In the past six months, have you accidently leaked urine? N   Do you have problems with loss of bowel control? N    Managing your Medications? N   Managing your Finances? N   Housekeeping or managing your Housekeeping? N     Patient Care Team: Lauree Chandler, NP as PCP - General (Geriatric Medicine) Deboraha Sprang, MD as PCP - Electrophysiology (Cardiology) Jacolyn Reedy, MD as Consulting Physician (Cardiology) Larey Dresser, MD as Consulting Physician (Cardiology) Deboraha Sprang, MD as Consulting Physician (Cardiology)  Indicate any recent Medical Services you may have received from other than [facility] providers in the past year (date may be approximate).     Assessment:   This is a routine wellness examination for Mickael.  Hearing/Vision screen Hearing Screening - Comments:: Patient will schedule after eye surgery Vision Screening - Comments:: Patient had a vision screening a few weeks ago . Surgery 01/13/2023 , 02/03/2023 for eye surgery  Dietary issues and exercise activities discussed: Current Exercise Habits: Home exercise routine, Type of exercise: walking, Time (Minutes): 20   Goals Addressed   None    Depression Screen    10/29/2022    8:54 AM 03/12/2022    8:33 AM 10/19/2021    8:30 AM 09/14/2021    8:31 AM 03/16/2021    8:39 AM 10/16/2020    8:42 AM 09/15/2020  9:02 AM  PHQ 2/9 Scores  PHQ - 2 Score 0 0 0 0 0 0 0    Fall Risk    10/29/2022    8:54 AM 09/13/2022    8:19 AM 03/12/2022    8:33 AM 10/19/2021    8:30 AM 09/14/2021    8:31 AM  Fall Risk   Falls in the past year? 0 0 0 0 0  Number falls in past yr: 0 0 0 0 0  Injury with Fall? 0 0 0 0 0  Risk for fall due to : No Fall Risks No Fall Risks No Fall Risks No Fall Risks No Fall Risks  Follow up Falls evaluation completed  Falls evaluation completed Falls evaluation completed Falls evaluation completed    FALL RISK PREVENTION PERTAINING TO THE HOME:  Any stairs in or around the home? Yes  If so, are there any without handrails? No  Home free of loose throw rugs in walkways, pet beds, electrical  cords, etc? Yes  Adequate lighting in your home to reduce risk of falls? Yes   ASSISTIVE DEVICES UTILIZED TO PREVENT FALLS:  Life alert? No  Use of a cane, walker or w/c? No  Grab bars in the bathroom? Yes  Shower chair or bench in shower? No  Elevated toilet seat or a handicapped toilet? Yes   TIMED UP AND GO:  Was the test performed? No .    Cognitive Function:        10/29/2022    8:54 AM 10/19/2021    8:32 AM 10/16/2020    8:45 AM  6CIT Screen  What Year? 0 points 0 points 0 points  What month? 0 points 0 points 0 points  What time? 0 points 0 points 0 points  Count back from 20 0 points 0 points 0 points  Months in reverse 0 points 0 points 0 points  Repeat phrase 0 points 0 points 0 points  Total Score 0 points 0 points 0 points    Immunizations Immunization History  Administered Date(s) Administered   Hepatitis B, adult 03/06/2015   Influenza, High Dose Seasonal PF 10/09/2018, 09/28/2019, 09/01/2020, 09/09/2021   Influenza,inj,Quad PF,6+ Mos 09/17/2016, 11/15/2017   Moderna Covid-19 Vaccine Bivalent Booster 30yr & up 09/09/2021   PFIZER(Purple Top)SARS-COV-2 Vaccination 02/02/2020, 02/24/2020, 10/18/2020   PNEUMOCOCCAL CONJUGATE-20 03/02/2022   Pneumococcal Polysaccharide-23 09/15/2020   Td 12/20/2005   Tdap 09/17/2016, 03/06/2021   Zoster Recombinat (Shingrix) 03/06/2021, 03/02/2022   Zoster, Live 08/29/2015    TDAP status: Up to date  Flu Vaccine status: Due, Education has been provided regarding the importance of this vaccine. Advised may receive this vaccine at local pharmacy or Health Dept. Aware to provide a copy of the vaccination record if obtained from local pharmacy or Health Dept. Verbalized acceptance and understanding.  Pneumococcal vaccine status: Up to date  Covid-19 vaccine status: Information provided on how to obtain vaccines.   Qualifies for Shingles Vaccine? Yes   Zostavax completed Yes   Shingrix Completed?: No.    Education has  been provided regarding the importance of this vaccine. Patient has been advised to call insurance company to determine out of pocket expense if they have not yet received this vaccine. Advised may also receive vaccine at local pharmacy or Health Dept. Verbalized acceptance and understanding.  Screening Tests Health Maintenance  Topic Date Due   COVID-19 Vaccine (5 - Pfizer series) 11/14/2022 (Originally 01/09/2022)   INFLUENZA VACCINE  03/20/2023 (Originally 07/20/2022)   Medicare Annual Wellness (  AWV)  10/30/2023   TETANUS/TDAP  03/07/2031   COLONOSCOPY (Pts 45-38yr Insurance coverage will need to be confirmed)  07/29/2031   Pneumonia Vaccine 71 Years old  Completed   Hepatitis C Screening  Completed   Zoster Vaccines- Shingrix  Completed   HPV VACCINES  Aged Out    Health Maintenance  There are no preventive care reminders to display for this patient.   Colorectal cancer screening: Type of screening: Colonoscopy. Completed 2022. Repeat every 10 years  Lung Cancer Screening: (Low Dose CT Chest recommended if Age 69-80years, 30 pack-year currently smoking OR have quit w/in 15years.) does not qualify.   Lung Cancer Screening Referral: na  Additional Screening:  Hepatitis C Screening: does qualify; Completed  Vision Screening: Recommended annual ophthalmology exams for early detection of glaucoma and other disorders of the eye. Is the patient up to date with their annual eye exam?  Yes  Who is the provider or what is the name of the office in which the patient attends annual eye exams? [facility] If pt is not established with a provider, would they like to be referred to a provider to establish care? No .   Dental Screening: Recommended annual dental exams for proper oral hygiene  Community Resource Referral / Chronic Care Management: CRR required this visit?  No   CCM required this visit?  No      Plan:     I have personally reviewed and noted the following in the  patient's chart:   Medical and social history Use of alcohol, tobacco or illicit drugs  Current medications and supplements including opioid prescriptions. Patient is not currently taking opioid prescriptions. Functional ability and status Nutritional status Physical activity Advanced directives List of other physicians Hospitalizations, surgeries, and ER visits in previous 12 months Vitals Screenings to include cognitive, depression, and falls Referrals and appointments  In addition, I have reviewed and discussed with patient certain preventive protocols, quality metrics, and best practice recommendations. A written personalized care plan for preventive services as well as general preventive health recommendations were provided to patient.     JLauree Chandler NP   10/29/2022    Virtual Visit via Telephone Note  I connected with patient 10/29/22 at  3:00 PM EST by telephone and verified that I am speaking with the correct person using two identifiers.  [place]  I discussed the limitations, risks, security and privacy concerns of performing an evaluation and management service by telephone and the availability of in person appointments. I also discussed with the patient that there may be a patient responsible charge related to this service. The patient expressed understanding and agreed to proceed.   I discussed the assessment and treatment plan with the patient. The patient was provided an opportunity to ask questions and all were answered. The patient agreed with the plan and demonstrated an understanding of the instructions.   The patient was advised to call back or seek an in-person evaluation if the symptoms worsen or if the condition fails to improve as anticipated.  I provided 14 minutes of non-face-to-face time during this encounter.  JCarlos American EHarle BattiestAvs printed and mailed

## 2022-10-29 NOTE — Progress Notes (Signed)
This service is provided via telemedicine  No vital signs collected/recorded due to the encounter was a telemedicine visit.   Location of patient (ex: home, work):  home  Patient consents to a telephone visit:  yes  Location of the provider (ex: office, home):  office  Name of any referring provider:  Lauree Chandler, NP   Names of all persons participating in the telemedicine service and their role in the encounter:  Patient and Sanjuana Kava CMA  Time spent on call:  9 minutes

## 2022-10-29 NOTE — Patient Instructions (Signed)
Isaiah Wu , Thank you for taking time to come for your Medicare Wellness Visit. I appreciate your ongoing commitment to your health goals. Please review the following plan we discussed and let me know if I can assist you in the future.   Screening recommendations/referrals: Colonoscopy up to date Recommended yearly ophthalmology/optometry visit for glaucoma screening and checkup Recommended yearly dental visit for hygiene and checkup  Vaccinations: Influenza vaccine due annually in September/October/November Pneumococcal vaccine up to date Tdap vaccine up to date Shingles vaccine up to date    Advanced directives: recommended to complete and bring to office to place on file.   Conditions/risks identified: advance age, increased BMI  Next appointment: yearly  Preventive Care 63 Years and Older, Male Preventive care refers to lifestyle choices and visits with your health care provider that can promote health and wellness. What does preventive care include? A yearly physical exam. This is also called an annual well check. Dental exams once or twice a year. Routine eye exams. Ask your health care provider how often you should have your eyes checked. Personal lifestyle choices, including: Daily care of your teeth and gums. Regular physical activity. Eating a healthy diet. Avoiding tobacco and drug use. Limiting alcohol use. Practicing safe sex. Taking low doses of aspirin every day. Taking vitamin and mineral supplements as recommended by your health care provider. What happens during an annual well check? The services and screenings done by your health care provider during your annual well check will depend on your age, overall health, lifestyle risk factors, and family history of disease. Counseling  Your health care provider may ask you questions about your: Alcohol use. Tobacco use. Drug use. Emotional well-being. Home and relationship well-being. Sexual activity. Eating  habits. History of falls. Memory and ability to understand (cognition). Work and work Statistician. Screening  You may have the following tests or measurements: Height, weight, and BMI. Blood pressure. Lipid and cholesterol levels. These may be checked every 5 years, or more frequently if you are over 60 years old. Skin check. Lung cancer screening. You may have this screening every year starting at age 41 if you have a 30-pack-year history of smoking and currently smoke or have quit within the past 15 years. Fecal occult blood test (FOBT) of the stool. You may have this test every year starting at age 22. Flexible sigmoidoscopy or colonoscopy. You may have a sigmoidoscopy every 5 years or a colonoscopy every 10 years starting at age 45. Prostate cancer screening. Recommendations will vary depending on your family history and other risks. Hepatitis C blood test. Hepatitis B blood test. Sexually transmitted disease (STD) testing. Diabetes screening. This is done by checking your blood sugar (glucose) after you have not eaten for a while (fasting). You may have this done every 1-3 years. Abdominal aortic aneurysm (AAA) screening. You may need this if you are a current or former smoker. Osteoporosis. You may be screened starting at age 21 if you are at high risk. Talk with your health care provider about your test results, treatment options, and if necessary, the need for more tests. Vaccines  Your health care provider may recommend certain vaccines, such as: Influenza vaccine. This is recommended every year. Tetanus, diphtheria, and acellular pertussis (Tdap, Td) vaccine. You may need a Td booster every 10 years. Zoster vaccine. You may need this after age 32. Pneumococcal 13-valent conjugate (PCV13) vaccine. One dose is recommended after age 23. Pneumococcal polysaccharide (PPSV23) vaccine. One dose is recommended after age  31. Talk to your health care provider about which screenings and  vaccines you need and how often you need them. This information is not intended to replace advice given to you by your health care provider. Make sure you discuss any questions you have with your health care provider. Document Released: 01/02/2016 Document Revised: 08/25/2016 Document Reviewed: 10/07/2015 Elsevier Interactive Patient Education  2017 [place] Prevention in the Home Falls can cause injuries. They can happen to people of all ages. There are many things you can do to make your home safe and to help prevent falls. What can I do on the outside of my home? Regularly fix the edges of walkways and driveways and fix any cracks. Remove anything that might make you trip as you walk through a door, such as a raised step or threshold. Trim any bushes or trees on the path to your home. Use bright outdoor lighting. Clear any walking paths of anything that might make someone trip, such as rocks or tools. Regularly check to see if handrails are loose or broken. Make sure that both sides of any steps have handrails. Any raised decks and porches should have guardrails on the edges. Have any leaves, snow, or ice cleared regularly. Use sand or salt on walking paths during winter. Clean up any spills in your garage right away. This includes oil or grease spills. What can I do in the bathroom? Use night lights. Install grab bars by the toilet and in the tub and shower. Do not use towel bars as grab bars. Use non-skid mats or decals in the tub or shower. If you need to sit down in the shower, use a plastic, non-slip stool. Keep the floor dry. Clean up any water that spills on the floor as soon as it happens. Remove soap buildup in the tub or shower regularly. Attach bath mats securely with double-sided non-slip rug tape. Do not have throw rugs and other things on the floor that can make you trip. What can I do in the bedroom? Use night lights. Make sure that you have a light by your  bed that is easy to reach. Do not use any sheets or blankets that are too big for your bed. They should not hang down onto the floor. Have a firm chair that has side arms. You can use this for support while you get dressed. Do not have throw rugs and other things on the floor that can make you trip. What can I do in the kitchen? Clean up any spills right away. Avoid walking on wet floors. Keep items that you use a lot in easy-to-reach places. If you need to reach something above you, use a strong step stool that has a grab bar. Keep electrical cords out of the way. Do not use floor polish or wax that makes floors slippery. If you must use wax, use non-skid floor wax. Do not have throw rugs and other things on the floor that can make you trip. What can I do with my stairs? Do not leave any items on the stairs. Make sure that there are handrails on both sides of the stairs and use them. Fix handrails that are broken or loose. Make sure that handrails are as long as the stairways. Check any carpeting to make sure that it is firmly attached to the stairs. Fix any carpet that is loose or worn. Avoid having throw rugs at the top or bottom of the stairs. If you do have  throw rugs, attach them to the floor with carpet tape. Make sure that you have a light switch at the top of the stairs and the bottom of the stairs. If you do not have them, ask someone to add them for you. What else can I do to help prevent falls? Wear shoes that: Do not have high heels. Have rubber bottoms. Are comfortable and fit you well. Are closed at the toe. Do not wear sandals. If you use a stepladder: Make sure that it is fully opened. Do not climb a closed stepladder. Make sure that both sides of the stepladder are locked into place. Ask someone to hold it for you, if possible. Clearly mark and make sure that you can see: Any grab bars or handrails. First and last steps. Where the edge of each step is. Use tools that  help you move around (mobility aids) if they are needed. These include: Canes. Walkers. Scooters. Crutches. Turn on the lights when you go into a dark area. Replace any light bulbs as soon as they burn out. Set up your furniture so you have a clear path. Avoid moving your furniture around. If any of your floors are uneven, fix them. If there are any pets around you, be aware of where they are. Review your medicines with your doctor. Some medicines can make you feel dizzy. This can increase your chance of falling. Ask your doctor what other things that you can do to help prevent falls. This information is not intended to replace advice given to you by your health care provider. Make sure you discuss any questions you have with your health care provider. Document Released: 10/02/2009 Document Revised: 05/13/2016 Document Reviewed: 01/10/2015 Elsevier Interactive Patient Education  2017 [employer].

## 2022-11-02 ENCOUNTER — Other Ambulatory Visit (HOSPITAL_COMMUNITY): Payer: Self-pay

## 2022-11-02 MED ORDER — ENTRESTO 49-51 MG PO TABS: 1.0000 | ORAL_TABLET | Freq: Two times a day (BID) | ORAL | 0 refills | Status: DC

## 2022-11-03 ENCOUNTER — Telehealth (HOSPITAL_COMMUNITY): Payer: Self-pay

## 2022-11-03 ENCOUNTER — Other Ambulatory Visit (HOSPITAL_COMMUNITY): Payer: Self-pay

## 2022-11-03 NOTE — Telephone Encounter (Signed)
Advanced Heart Failure Patient Advocate Encounter   Received renewal notification for [employer]). This patient is eligible for a grant that is currently open and would cover the cost of this medication.   Left voicemail for patient to call back to start application process.   Clista Bernhardt, CPhT Rx Patient Advocate Phone: [phone]

## 2022-11-05 ENCOUNTER — Other Ambulatory Visit (HOSPITAL_COMMUNITY): Payer: Self-pay

## 2022-11-05 NOTE — Telephone Encounter (Signed)
Advanced Heart Failure Patient Advocate Encounter  The patient was approved for a [place] that will help cover the cost of Entresto.  Total amount awarded, $10,000.  Effective: 10/06/2022 - 10/06/2023.  BIN [id number] PCN [id number] Group [id number] ID [id number]  New prescription(s) sent to [facility]. Patient provided with approval and processing information via email.  Clista Bernhardt, CPhT Rx Patient Advocate Phone: [phone]

## 2022-11-08 ENCOUNTER — Other Ambulatory Visit (HOSPITAL_COMMUNITY): Payer: Self-pay | Admitting: *Deleted

## 2022-11-08 MED ORDER — ENTRESTO 49-51 MG PO TABS: 1.0000 | ORAL_TABLET | Freq: Two times a day (BID) | ORAL | 3 refills | Status: DC

## 2022-11-10 ENCOUNTER — Encounter: Payer: Medicare Other | Admitting: Student

## 2022-11-13 ENCOUNTER — Other Ambulatory Visit (HOSPITAL_COMMUNITY): Payer: Self-pay | Admitting: Cardiology

## 2022-11-15 NOTE — Telephone Encounter (Signed)
Advanced Heart Failure Patient Advocate Encounter  Patient sent an email stating [facility] has a copay of [facility]47.  I contacted the pharmacy directly, confirmed billing information for the grant is on file. Pharmacy reprocessed claim and received $0 copay.  Sent email to patient to confirm.

## 2022-11-19 HISTORY — PX: PACEMAKER GENERATOR CHANGE: SHX5998

## 2022-12-01 ENCOUNTER — Other Ambulatory Visit (HOSPITAL_COMMUNITY): Payer: Self-pay | Admitting: Cardiology

## 2022-12-20 HISTORY — PX: CATARACT EXTRACTION: SUR2

## 2022-12-24 ENCOUNTER — Ambulatory Visit (INDEPENDENT_AMBULATORY_CARE_PROVIDER_SITE_OTHER): Payer: Medicare Other

## 2022-12-24 DIAGNOSIS — I428 Other cardiomyopathies: Secondary | ICD-10-CM

## 2022-12-24 LAB — CUP PACEART REMOTE DEVICE CHECK
Battery Remaining Longevity: 65 mo
Battery Remaining Percentage: 92 %
Battery Voltage: 3.1 V
Brady Statistic AP VP Percent: 23 %
Brady Statistic AP VS Percent: 1 %
Brady Statistic AS VP Percent: 77 %
Brady Statistic AS VS Percent: 1 %
Brady Statistic RA Percent Paced: 22 %
Date Time Interrogation Session: 20240105020017
HighPow Impedance: 77 Ohm
HighPow Impedance: 77 Ohm
Implantable Lead Connection Status: 753985
Implantable Lead Connection Status: 753985
Implantable Lead Connection Status: 753985
Implantable Lead Implant Date: 20160801
Implantable Lead Implant Date: 20160801
Implantable Lead Implant Date: 20160801
Implantable Lead Location: 753858
Implantable Lead Location: 753859
Implantable Lead Location: 753860
Implantable Lead Model: 7122
Implantable Pulse Generator Implant Date: 20231006
Lead Channel Impedance Value: 1125 Ohm
Lead Channel Impedance Value: 440 Ohm
Lead Channel Impedance Value: 450 Ohm
Lead Channel Pacing Threshold Amplitude: 0.75 V
Lead Channel Pacing Threshold Amplitude: 1.25 V
Lead Channel Pacing Threshold Amplitude: 1.5 V
Lead Channel Pacing Threshold Pulse Width: 0.5 ms
Lead Channel Pacing Threshold Pulse Width: 0.6 ms
Lead Channel Pacing Threshold Pulse Width: 1.5 ms
Lead Channel Sensing Intrinsic Amplitude: 12 mV
Lead Channel Sensing Intrinsic Amplitude: 2.5 mV
Lead Channel Setting Pacing Amplitude: 2 V
Lead Channel Setting Pacing Amplitude: 2.5 V
Lead Channel Setting Pacing Amplitude: 2.5 V
Lead Channel Setting Pacing Pulse Width: 0.6 ms
Lead Channel Setting Pacing Pulse Width: 1.5 ms
Lead Channel Setting Sensing Sensitivity: 0.5 mV
Pulse Gen Serial Number: 8927074

## 2022-12-25 ENCOUNTER — Other Ambulatory Visit (HOSPITAL_COMMUNITY): Payer: Self-pay | Admitting: Cardiology

## 2023-01-03 ENCOUNTER — Encounter: Payer: Medicare Other | Admitting: Internal Medicine

## 2023-01-05 ENCOUNTER — Ambulatory Visit (HOSPITAL_COMMUNITY)
Admission: RE | Admit: 2023-01-05 | Discharge: 2023-01-05 | Disposition: A | Discharge status: Home / Self Care | Payer: Medicare Other | Source: Ambulatory Visit | Attending: Cardiology | Admitting: Cardiology

## 2023-01-05 ENCOUNTER — Encounter (HOSPITAL_COMMUNITY): Payer: Self-pay | Admitting: Cardiology

## 2023-01-05 VITALS — BP 102/70 | HR 70 | Wt 241.4 lb

## 2023-01-05 DIAGNOSIS — I428 Other cardiomyopathies: Secondary | ICD-10-CM | POA: Insufficient documentation

## 2023-01-05 DIAGNOSIS — I5022 Chronic systolic (congestive) heart failure: Secondary | ICD-10-CM | POA: Diagnosis not present

## 2023-01-05 DIAGNOSIS — I447 Left bundle-branch block, unspecified: Secondary | ICD-10-CM | POA: Insufficient documentation

## 2023-01-05 DIAGNOSIS — Z79899 Other long term (current) drug therapy: Secondary | ICD-10-CM | POA: Diagnosis not present

## 2023-01-05 DIAGNOSIS — I110 Hypertensive heart disease with heart failure: Secondary | ICD-10-CM | POA: Insufficient documentation

## 2023-01-05 NOTE — Patient Instructions (Signed)
There has been no changes to your medications.  Labs done today, your results will be available in MyChart, we will contact you for abnormal readings.  Your physician has requested that you have an echocardiogram. Echocardiography is a painless test that uses sound waves to create images of your heart. It provides your doctor with information about the size and shape of your heart and how well your heart's chambers and valves are working. This procedure takes approximately one hour. There are no restrictions for this procedure. Please do NOT wear cologne, perfume, aftershave, or lotions (deodorant is allowed). Please arrive 15 minutes prior to your appointment time.  Your provider has ordered a Calcium score scan. You will be called to have the test scheduled.  Your physician recommends that you schedule a follow-up appointment in: 1 year ( January 2025)  ** please call the office in November 2024 to arrange your follow up appointment.**  If you have any questions or concerns before your next appointment please send [place] a message through [place] or call our office at [phone].    TO LEAVE A MESSAGE FOR THE NURSE SELECT OPTION 2, PLEASE LEAVE A MESSAGE INCLUDING: YOUR NAME DATE OF BIRTH CALL BACK NUMBER REASON FOR CALL**this is important as we prioritize the call backs  YOU WILL RECEIVE A CALL BACK THE SAME DAY AS LONG AS YOU CALL BEFORE 4:00 PM  At the [facility], you and your health needs are our priority. As part of our continuing mission to provide you with exceptional heart care, we have created designated Provider Care Teams. These Care Teams include your primary Cardiologist (physician) and Advanced Practice Providers (APPs- Physician Assistants and Nurse Practitioners) who all work together to provide you with the care you need, when you need it.   You may see any of the following providers on your designated Care Team at your next follow up: Dr Glori Bickers Dr Loralie Champagne Dr. Roxana Hires, NP Lyda Jester, [place] [facility] [place], [place] Forestine Na, NP Audry Riles, PharmD   Please be sure to bring in all your medications bottles to every appointment.

## 2023-01-05 NOTE — Progress Notes (Signed)
Patient ID: Isaiah Wu, male   DOB: 1951-05-16, 72 y.o.   MRN: [id number] PCP: Dr. Rex Kras HF Cardiology: Dr. Aundra Dubin  72 y.o. with minimal PMH diagnosed with systolic HF in 1/91.Echo showed EF 15% with diffuse hypokinesis.  Initially seen by Dr. [name]. He was admitted to [facility] in 4/16 for diuresis.  RHC/LHC showed elevated filling pressures and no significant coronary disease.  Underwent St Jude BiV-ICD on 07/21/15.  Repeat echo in 2/17 showed EF up to 40-45%.  Echo in 10/18 showed EF up to 50-55%.  Echo in 10/19 with EF 45-50%.  Echo in 1/21 was stable with EF 45-50%.    Echo in 7/22 showed EF 50-55%, normal RV, normal IVC.   He returns for followup of CHF/dyspnea.  He is now working part time for [employer].  Getting less exercise and weight has been climbing.  No significant exertional dyspnea.  No orthopnea/PND.  No chest pain.  No lightheadedness.  Overall, feels quite good.   ECG (personally reviewed): a-paced, BiV paced.   Labs 4/16: TSH normal, SPEP negative, ferritin normal, HIV negative, K 4.6, creatinine 1.22, HCT 46.9 Labs 6/16: K 3.8, Creatinine 1.08 Labs 07/18/15: K 4.1, Creatinine 0.99 Labs 9/16: digoxin 0.3, K 4.3, creatinine 1.03 Labs 11/16: K 4.3, creatinine 1.09, digoxin 0.6, BNP 23 Labs 2/17: K 4.2, creatinine 1.13, BNP 15.9, digoxin 0.4 Labs 3/17: K 4.2, creatinine 1.09, LDL 65 Labs 6/17: digoxin 0.3 Labs 7/17: K 4.3, creatinine 1.19 Labs 8/17: digoxin 0.5, K 3.9, creatinine 1.15 Labs 4/18: LDL 71 Labs 7/18: K 4, creatinine 1.15 Labs 10/18: K 4, creatinine 1.03, TSH normal, hgb 15.9 Labs 7/19: LDL 98 Labs 10/19: K 4.1, creatinine 1.13  Labs 1/20: creatinine 0.98 Labs 10/20: LDL 90 Labs 1/21: K 4.6, creatinine 1.12 Labs 9/21: LDL 86 Labs 10/21: K 4.1, creatinine 1.16 Labs 1/22: K 4.3, creatinine 1.08 Labs 10/23: K 4.7, creatinine 0.87, LDL 82, TGs 157  PMH: 1. HTN 2. GERD 3. LBBB 4. Cardiomyopathy: Nonischemic.  Echo (4/16) with EF 15%,  severe diffuse hypokinesis [name]).  RHC/LHC (4/16) with normal coronaries; mean RA 10, PA 58/35 mean 48, mean PCWP 32, no comment on CO.  HIV negative, TSH normal, ferritin normal, SPEP normal. S/p St Jude CRT-D 07/21/15.  - cMRI  04/2015 with severely dilated LV, EF 14%, septal-lateral dyssynchrony (prominent), normal RV size with mild to moderately decreased systolic function, at least moderate functional mitral regurgitation, there was basal inferoseptal subepicardial LGE (small area) and apical septal subtle mid-wall LGE (small area).   - Echo (9/16) with EF 25-30%, mild MR => improved.  - CPX (1/17) with VE/VCO2 26.2, peak VO2 20.3, RER 1.13 => mild heart failure limitation.   - Echo (2/17) EF 40-45%.   - Echo (4/18) with EF 50-55% - Echo (10/18): EF 50-55%, mild RV dilation with normal systolic function.  - Echo (10/19): EF 45-50%, RV mildly dilated, normal systolic function.  - Echo (1/21): EF 45-50%, low normal RV function.  - Echo (7/22): EF 50-55%, normal RV, normal IVC 5. Syncopal episode 6/16: Monitor ok. Suspect orthostasis.  6. Sleep study (12/22): Mild OSA.     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  No cardiomyopathy or sudden death.   ROS: All systems reviewed and negative except as per HPI.   Current Outpatient Medications  Medication Sig  Dispense Refill   carvedilol (COREG) 12.5 MG tablet TAKE 1 TABLET BY MOUTH 2 TIMES DAILY WITH A MEAL. NEEDS FOLLOW UP APPOINTMENT FOR MORE REFILLS 180 tablet 1   furosemide (LASIX) 20 MG tablet TAKE 1 TABLET (20 MG TOTAL) BY MOUTH DAILY AS NEEDED. NEED FOLLOW UP APPOINTMENT FOR ANYMORE REFILLS 90 tablet 0   loratadine (CLARITIN) 10 MG tablet Take 10 mg by mouth as needed for allergies.     Multiple Vitamins-Minerals (CENTRUM SILVER 50+MEN) TABS Take 1 tablet by mouth daily.     sacubitril-valsartan (ENTRESTO)  49-51 MG Take 1 tablet by mouth 2 (two) times daily. 60 tablet 3   spironolactone (ALDACTONE) 25 MG tablet Take 1 tablet (25 mg total) by mouth daily. 90 tablet 3   No current facility-administered medications for this encounter.   BP 102/70   Pulse 70   Wt 109.5 kg (241 lb 6.4 oz)   SpO2 96%   BMI 31.85 kg/m  General: NAD Neck: No JVD, no thyromegaly or thyroid nodule.  Lungs: Clear to auscultation bilaterally with normal respiratory effort. CV: Nondisplaced PMI.  Heart regular S1/S2, no S3/S4, no murmur.  No peripheral edema.  No carotid bruit.  Normal pedal pulses.  Abdomen: Soft, nontender, no hepatosplenomegaly, no distention.  Skin: Intact without lesions or rashes.  Neurologic: Alert and oriented x 3.  Psych: Normal affect. Extremities: No clubbing or cyanosis.  HEENT: Normal.   Assessment/Plan:  1. Chronic systolic CHF: Nonischemic cardiomyopathy, EF 15% initially, up to 50-55% on 7/22 echo with medical treatment and St Jude CRT-D. Etiology of cardiomyopathy is uncertain: normal coronaries, no symptoms suggestive of viral syndrome prior to admission, HIV/Ferritin/SPEP/TSH unremarkable.  No history of familial CMP.  He has LBBB of uncertain duration.  ? LBBB cardiomyopathy.  However, there was also mid-wall LGE in the septum on cardiac MRI, suggesting possibility of myocarditis.  CPX in 1/17 suggested only a mild heart failure limitation.  He is not volume overloaded on exam, NYHA class I.   - Continue spironolactone 25 mg daily, Entresto 49/51 bid.  BMET today.  - Continue Coreg 12.5 mg bid.     - I will obtain echo to make sure EF has not fallen.  - I would like to see him increase exercise.      2. LBBB: Of uncertain duration.  3. Cardiac risk factor evaluation: I would like the patient to have a calcium score scan for risk stratification, he may benefit from statin.   Followup in 1 year if echo stable.   Walburga Hudman,MD 01/05/2023

## 2023-01-11 NOTE — Progress Notes (Signed)
Remote ICD transmission.   

## 2023-01-13 DIAGNOSIS — H25811 Combined forms of age-related cataract, right eye: Secondary | ICD-10-CM | POA: Diagnosis not present

## 2023-01-13 DIAGNOSIS — H2511 Age-related nuclear cataract, right eye: Secondary | ICD-10-CM | POA: Diagnosis not present

## 2023-01-14 DIAGNOSIS — H25012 Cortical age-related cataract, left eye: Secondary | ICD-10-CM | POA: Diagnosis not present

## 2023-01-14 DIAGNOSIS — H25042 Posterior subcapsular polar age-related cataract, left eye: Secondary | ICD-10-CM | POA: Diagnosis not present

## 2023-01-14 DIAGNOSIS — H2512 Age-related nuclear cataract, left eye: Secondary | ICD-10-CM | POA: Diagnosis not present

## 2023-01-18 ENCOUNTER — Ambulatory Visit (HOSPITAL_COMMUNITY)
Admission: RE | Admit: 2023-01-18 | Discharge: 2023-01-18 | Disposition: A | Discharge status: Home / Self Care | Payer: Medicare Other | Source: Ambulatory Visit | Attending: Cardiology | Admitting: Cardiology

## 2023-01-18 DIAGNOSIS — I5022 Chronic systolic (congestive) heart failure: Secondary | ICD-10-CM | POA: Insufficient documentation

## 2023-01-19 ENCOUNTER — Ambulatory Visit: Payer: Medicare Other | Attending: Internal Medicine | Admitting: Internal Medicine

## 2023-01-19 ENCOUNTER — Encounter: Payer: Self-pay | Admitting: Internal Medicine

## 2023-01-19 VITALS — BP 116/78 | HR 67 | Ht 73.0 in | Wt 239.6 lb

## 2023-01-19 DIAGNOSIS — I428 Other cardiomyopathies: Secondary | ICD-10-CM | POA: Diagnosis not present

## 2023-01-19 DIAGNOSIS — Z95810 Presence of automatic (implantable) cardiac defibrillator: Secondary | ICD-10-CM | POA: Diagnosis not present

## 2023-01-19 DIAGNOSIS — I5022 Chronic systolic (congestive) heart failure: Secondary | ICD-10-CM | POA: Diagnosis not present

## 2023-01-19 LAB — CUP PACEART INCLINIC DEVICE CHECK
Battery Remaining Longevity: 64 mo
Brady Statistic RA Percent Paced: 24 %
Brady Statistic RV Percent Paced: 99.69 %
Date Time Interrogation Session: 20240131213427
HighPow Impedance: 78.75 Ohm
Implantable Lead Connection Status: 753985
Implantable Lead Connection Status: 753985
Implantable Lead Connection Status: 753985
Implantable Lead Implant Date: 20160801
Implantable Lead Implant Date: 20160801
Implantable Lead Implant Date: 20160801
Implantable Lead Location: 753858
Implantable Lead Location: 753859
Implantable Lead Location: 753860
Implantable Lead Model: 7122
Implantable Pulse Generator Implant Date: 20231006
Lead Channel Impedance Value: 1212.5 Ohm
Lead Channel Impedance Value: 412.5 Ohm
Lead Channel Impedance Value: 450 Ohm
Lead Channel Pacing Threshold Amplitude: 0.75 V
Lead Channel Pacing Threshold Amplitude: 0.75 V
Lead Channel Pacing Threshold Amplitude: 1.25 V
Lead Channel Pacing Threshold Amplitude: 1.25 V
Lead Channel Pacing Threshold Amplitude: 1.25 V
Lead Channel Pacing Threshold Amplitude: 1.25 V
Lead Channel Pacing Threshold Pulse Width: 0.5 ms
Lead Channel Pacing Threshold Pulse Width: 0.5 ms
Lead Channel Pacing Threshold Pulse Width: 0.6 ms
Lead Channel Pacing Threshold Pulse Width: 0.6 ms
Lead Channel Pacing Threshold Pulse Width: 1.5 ms
Lead Channel Pacing Threshold Pulse Width: 1.5 ms
Lead Channel Sensing Intrinsic Amplitude: 12 mV
Lead Channel Sensing Intrinsic Amplitude: 4.2 mV
Lead Channel Setting Pacing Amplitude: 2 V
Lead Channel Setting Pacing Amplitude: 2.5 V
Lead Channel Setting Pacing Amplitude: 2.5 V
Lead Channel Setting Pacing Pulse Width: 0.6 ms
Lead Channel Setting Pacing Pulse Width: 1.5 ms
Lead Channel Setting Sensing Sensitivity: 0.5 mV
Pulse Gen Serial Number: 8927074

## 2023-01-19 MED ORDER — EPLERENONE 50 MG PO TABS: 50.0000 mg | ORAL_TABLET | Freq: Every day | ORAL | 3 refills | Status: DC

## 2023-01-19 NOTE — Progress Notes (Signed)
Patient Care Team: Lauree Chandler, NP as PCP - General (Geriatric Medicine) Deboraha Sprang, MD as PCP - Electrophysiology (Cardiology) Jacolyn Reedy, MD as Consulting Physician (Cardiology) Larey Dresser, MD as Consulting Physician (Cardiology) Deboraha Sprang, MD as Consulting Physician (Cardiology)   HPI  Isaiah Wu is a 72 y.o. male seen in follow-up for CRT-D Abbott generator replacement 10/23; initial implant 2016  Interval resolution of LV dysfunction   The patient denies chest pain, shortness of breath, nocturnal dyspnea, orthopnea or peripheral edema.  There have been no palpitations, lightheadedness or syncope. Marland Kitchen   DATE TEST EF    4/16 Echo  15%    4/16 LHC   CAs normal  2/17 Echo  45%    10/18 Echo  55%    7/22 Echo  50-55%    1/24 Ca Score  Calcium score 9  bilateral gynecomastia    Date Cr K Hgb  7/22 1.10 3.9 15.5 (9/22)   9/23 1.18 4.3 16.1        Device History: STJ CRTD implanted 2016 for NICM, CHF, LBBB History of appropriate therapy: No History of AAD therapy: No   Records and Results Reviewed   Past Medical History:  Diagnosis Date   Actinic keratoses    Per Records from [place]    Cardiomyopathy ([place]) 4/74/2595   Chronic systolic CHF (congestive heart failure) ([place]) 04/14/2015   a. s/p STJ CRTD b. EF normalized with CRT therapy   Colon polyp    Per Records from [place]    Elevated PSA    Followed by Dr. 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    placed by Dr.McLean, Per Records from [place]    LBBB (left bundle branch block)    Non-ischemic cardiomyopathy ([place])    Per Records from [place] of breath    Per Records from [place]    Syncope 05/30/2015    Past Surgical History:  Procedure Laterality Date   BIV ICD GENERATOR CHANGEOUT N/A 09/24/2022   Procedure: BIV ICD GENERATOR CHANGEOUT;  Surgeon: Deboraha Sprang, MD;  Location: [facility];  Service: Cardiovascular;  Laterality: N/A;   CARDIAC CATHETERIZATION     COLONOSCOPY  08/30/2005   Per Records from [place] N/A 07/21/2015   STJ CRTD implanted by Dr Caryl Comes for NICM, CHF   LEFT AND RIGHT HEART CATHETERIZATION WITH CORONARY ANGIOGRAM N/A 04/09/2015   Procedure: LEFT AND RIGHT HEART CATHETERIZATION WITH CORONARY ANGIOGRAM;  Surgeon: Jacolyn Reedy, MD;  Location[facility] CATH LAB;  Service: Cardiovascular;  Laterality: N/A;   ORIF ELBOW FRACTURE Left 1981   SHOULDER SURGERY Left 05/2008   "had to put cadavear bone in and reattach muscle to it"   TONSILLECTOMY AND ADENOIDECTOMY  1960's    Current Outpatient Medications  Medication Sig Dispense Refill  BESIVANCE 0.6 % SUSP Apply 1 drop to eye 3 (three) times daily.     carvedilol (COREG) 12.5 MG tablet TAKE 1 TABLET BY MOUTH 2 TIMES DAILY WITH A MEAL. NEEDS FOLLOW UP APPOINTMENT FOR MORE REFILLS 180 tablet 1   furosemide (LASIX) 20 MG tablet TAKE 1 TABLET (20 MG TOTAL) BY MOUTH DAILY AS NEEDED. NEED FOLLOW UP APPOINTMENT FOR ANYMORE REFILLS 90 tablet 0   loratadine (CLARITIN) 10 MG tablet Take 10 mg by mouth as needed for allergies.     Multiple Vitamins-Minerals (CENTRUM SILVER 50+MEN) TABS Take 1 tablet by mouth daily.     prednisoLONE acetate (PRED FORTE) 1 % ophthalmic suspension Place 1 drop into the right eye in the morning and at bedtime.     PROLENSA 0.07 % SOLN Place 1 drop into the right eye at bedtime.     sacubitril-valsartan (ENTRESTO) 49-51  MG Take 1 tablet by mouth 2 (two) times daily. 60 tablet 3   spironolactone (ALDACTONE) 25 MG tablet Take 1 tablet (25 mg total) by mouth daily. 90 tablet 3   No current facility-administered medications for this visit.    No Known Allergies    Social History   Tobacco Use   Smoking status: Former    Packs/day: 1.00    Years: 2.00    Total pack years: 2.00    Types: Cigarettes    Start date: 12/20/1970    Quit date: 12/20/1973    Years since quitting: 49.1   Smokeless tobacco: Never   Tobacco comments:    'quit smoking in the 1970's"  Vaping Use   Vaping Use: Never used  Substance Use Topics   Alcohol use: Yes    Alcohol/week: 4.0 standard drinks of alcohol    Types: 4 Glasses of wine per week   Drug use: No     Family History  Problem Relation Age of Onset   CVA Mother    High Cholesterol Mother        Per Records from [place] Mother        Per Records from [place]    High blood pressure Mother        Per Records from [place]    Bone cancer Father    Diabetes Father    Colon polyps Father        Precamcerous, Per Records from [place]    High blood pressure Sister    Colon cancer Neg Hx    Esophageal cancer Neg Hx    Rectal cancer Neg Hx    Stomach cancer Neg Hx      Current Meds  Medication Sig   BESIVANCE 0.6 % SUSP Apply 1 drop to eye 3 (three) times daily.   carvedilol (COREG) 12.5 MG tablet TAKE 1 TABLET BY MOUTH 2 TIMES DAILY WITH A MEAL. NEEDS FOLLOW UP APPOINTMENT FOR MORE REFILLS   furosemide (LASIX) 20 MG tablet TAKE 1 TABLET (20 MG TOTAL) BY MOUTH DAILY AS NEEDED. NEED FOLLOW UP APPOINTMENT FOR ANYMORE REFILLS   loratadine (CLARITIN) 10 MG tablet Take 10 mg by mouth as needed for allergies.   Multiple Vitamins-Minerals (CENTRUM SILVER 50+MEN) TABS Take 1 tablet by mouth daily.   prednisoLONE acetate (PRED FORTE) 1 % ophthalmic suspension Place 1 drop into the right eye in the morning and at bedtime.    PROLENSA 0.07 % SOLN Place 1 drop into the right eye at bedtime.   sacubitril-valsartan (ENTRESTO) 49-51 MG Take  1 tablet by mouth 2 (two) times daily.   spironolactone (ALDACTONE) 25 MG tablet Take 1 tablet (25 mg total) by mouth daily.     Review of Systems negative except from HPI and PMH  Physical Exam BP 116/78   Pulse 67   Ht '6\' 1"'$  (1.854 m)   Wt 239 lb 9.6 oz (108.7 kg)   SpO2 97%   BMI 31.61 kg/m  Well developed and well nourished in no acute distress HENT normal E scleral and icterus clear Neck Supple JVP flat; carotids brisk and full Clear to ausculation Regular rate and rhythm, no murmurs gallops or rub Soft with active bowel sounds No clubbing cyanosis  Edema Alert and oriented, grossly normal motor and sensory function Skin Warm and Dry  AV pacing with an upright QRS lead V1 and negative QRS lead I open the [employer] rule following wife so the we have up to 33 gallons from a from a for her CT reports both cars I will go and then go to 35 gallon to do   Assessment and  Plan   Nonischemic cardiomyopathy interval near normalization   Congestive heart failure class II--diastolic    CRT-D -Abbott   Gynecomastia   Discussed with  Dr DM, we will stop the spironolactone with the gynecomastia started on eplerenone.  Volume status is stable.  Continue him on furosemide as needed.  With his cardiomyopathy continue Entresto and carvedilol

## 2023-01-19 NOTE — Patient Instructions (Addendum)
Medication Instructions:  Your physician has recommended you make the following change in your medication:   ** Stop Spironolactone  ** Start Eplerenone '50mg'$  - 1 tablet by mouth daily.  *If you need a refill on your cardiac medications before your next appointment, please call your pharmacy*   Lab Work: None ordered.  If you have labs (blood work) drawn today and your tests are completely normal, you will receive your results only by: [place] (if you have MyChart) OR A paper copy in the mail If you have any lab test that is abnormal or we need to change your treatment, we will call you to review the results.   Testing/Procedures: None ordered.    Follow-Up: At [facility], you and your health needs are our priority.  As part of our continuing mission to provide you with exceptional heart care, we have created designated Provider Care Teams.  These Care Teams include your primary Cardiologist (physician) and Advanced Practice Providers (APPs -  Physician Assistants and Nurse Practitioners) who all work together to provide you with the care you need, when you need it.  We recommend signing up for the patient portal called "MyChart".  Sign up information is provided on this After Visit Summary.  MyChart is used to connect with patients for Virtual Visits (Telemedicine).  Patients are able to view lab/test results, encounter notes, upcoming appointments, etc.  Non-urgent messages can be sent to your provider as well.   To learn more about what you can do with MyChart, go to [email or web address].    Your next appointment:   9 months with Dr Caryl Comes

## 2023-01-20 HISTORY — PX: CATARACT EXTRACTION: SUR2

## 2023-01-20 NOTE — Addendum Note (Signed)
Addended by: Michelle Nasuti on: 01/20/2023 09:59 AM   Modules accepted: Orders

## 2023-02-03 DIAGNOSIS — H2512 Age-related nuclear cataract, left eye: Secondary | ICD-10-CM | POA: Diagnosis not present

## 2023-02-03 DIAGNOSIS — H25812 Combined forms of age-related cataract, left eye: Secondary | ICD-10-CM | POA: Diagnosis not present

## 2023-02-14 ENCOUNTER — Other Ambulatory Visit (HOSPITAL_COMMUNITY): Payer: Self-pay | Admitting: Cardiology

## 2023-02-16 ENCOUNTER — Ambulatory Visit (HOSPITAL_COMMUNITY)
Admission: RE | Admit: 2023-02-16 | Discharge: 2023-02-16 | Disposition: A | Discharge status: Home / Self Care | Payer: Medicare Other | Source: Ambulatory Visit | Attending: Cardiology | Admitting: Cardiology

## 2023-02-16 DIAGNOSIS — I110 Hypertensive heart disease with heart failure: Secondary | ICD-10-CM | POA: Diagnosis not present

## 2023-02-16 DIAGNOSIS — I5022 Chronic systolic (congestive) heart failure: Secondary | ICD-10-CM

## 2023-02-16 DIAGNOSIS — Z95810 Presence of automatic (implantable) cardiac defibrillator: Secondary | ICD-10-CM | POA: Diagnosis not present

## 2023-02-16 DIAGNOSIS — I447 Left bundle-branch block, unspecified: Secondary | ICD-10-CM | POA: Diagnosis not present

## 2023-02-16 LAB — ECHOCARDIOGRAM COMPLETE
Area-P 1/2: 3.56 cm2
S' Lateral: 3.4 cm

## 2023-03-14 ENCOUNTER — Encounter: Payer: Self-pay | Admitting: Nurse Practitioner

## 2023-03-14 ENCOUNTER — Ambulatory Visit (INDEPENDENT_AMBULATORY_CARE_PROVIDER_SITE_OTHER): Payer: Medicare Other | Admitting: Nurse Practitioner

## 2023-03-14 VITALS — BP 128/80 | HR 70 | Temp 97.9°F | Ht 73.0 in | Wt 242.0 lb

## 2023-03-14 DIAGNOSIS — I10 Essential (primary) hypertension: Secondary | ICD-10-CM | POA: Diagnosis not present

## 2023-03-14 DIAGNOSIS — R7309 Other abnormal glucose: Secondary | ICD-10-CM

## 2023-03-14 DIAGNOSIS — I5022 Chronic systolic (congestive) heart failure: Secondary | ICD-10-CM

## 2023-03-14 DIAGNOSIS — J302 Other seasonal allergic rhinitis: Secondary | ICD-10-CM

## 2023-03-14 DIAGNOSIS — E6609 Other obesity due to excess calories: Secondary | ICD-10-CM

## 2023-03-14 DIAGNOSIS — Z6830 Body mass index (BMI) 30.0-30.9, adult: Secondary | ICD-10-CM

## 2023-03-14 DIAGNOSIS — M79672 Pain in left foot: Secondary | ICD-10-CM

## 2023-03-14 DIAGNOSIS — E66811 Obesity, class 1: Secondary | ICD-10-CM

## 2023-03-14 NOTE — Patient Instructions (Addendum)
Aleve 1 tablet twice daily for 5 days- take with food.  Freeze a water bottle and roll with foot/heal twice daily for 7 days.  Notify if symptoms do not improve

## 2023-03-14 NOTE — Progress Notes (Signed)
Careteam: Patient Care Team: Lauree Chandler, NP as PCP - General (Geriatric Medicine) Deboraha Sprang, MD as PCP - Electrophysiology (Cardiology) Jacolyn Reedy, MD as Consulting Physician (Cardiology) Larey Dresser, MD as Consulting Physician (Cardiology) Deboraha Sprang, MD as Consulting Physician (Cardiology)  PLACE OF SERVICE:  [place] Directive information Does Patient Have a Medical Advance Directive?: No, Would patient like information on creating a medical advance directive?: No - Patient declined  No Known Allergies  Chief Complaint  Patient presents with   Medical Management of Chronic Issues    6 month follow-up. Discuss need for additional covid boosters (refused). Left heel is sore x 1 week, no known injury.      HPI: Patient is a 72 y.o. male here for routine f/u.  He has been having some left heel pain for about a week and half. It is mainly when he walks on it. He has been using a heel cup/cushioning that does seem to help. It is not worse in the morning. He rates it about a 4/10 on the pain scale.   He had a pacemaker placed in December. He sees the EP every 6 months and cardiology annually. Cataract surgery in Jan/Feb as well. He says he will need hearing aids soon next. He will set up his own appointment with the audiologist.   Denies chest pain, SOB, palpitations. No swelling. No dizziness. He has been sleeping well without any trouble breathing at night. He weighs himself daily and takes lasix if he has a 3 lb weight gain.   Allergies are doing well. He started taking his claritin in February.  Review of Systems:  Review of Systems  Constitutional:  Negative for chills, fever, malaise/fatigue and weight loss.  HENT:  Negative for congestion and sore throat.   Eyes:  Negative for blurred vision.  Respiratory:  Negative for cough, shortness of breath and wheezing.   Cardiovascular:  Negative for chest pain, palpitations and leg  swelling.  Gastrointestinal:  Negative for abdominal pain, blood in stool, constipation, diarrhea, heartburn, nausea and vomiting.  Genitourinary:  Negative for dysuria, frequency, hematuria and urgency.  Musculoskeletal:  Negative for falls and joint pain.       L heel pain  Skin:  Negative for rash.  Neurological:  Negative for dizziness, tingling and headaches.  Endo/Heme/Allergies:  Negative for polydipsia.  Psychiatric/Behavioral:  Negative for depression. The patient is not nervous/anxious.     Past Medical History:  Diagnosis Date   Actinic keratoses    Per Records from [place]    Cardiomyopathy ([place]) 0000000   Chronic systolic CHF (congestive heart failure) ([place]) 04/14/2015   a. s/p STJ CRTD b. EF normalized with CRT therapy   Colon polyp    Per Records from [place]    Elevated PSA    Followed by Dr. 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   placed by Dr.McLean,  Per Records from [place]    LBBB (left bundle branch block)    Non-ischemic cardiomyopathy ([place])    Per Records from [place] of breath    Per Records from [place]    Syncope 05/30/2015   Past Surgical History:  Procedure Laterality Date   BIV ICD GENERATOR CHANGEOUT N/A 09/24/2022   Procedure: BIV ICD GENERATOR CHANGEOUT;  Surgeon: Deboraha Sprang, MD;  Location: [facility];  Service: Cardiovascular;  Laterality: N/A;   CARDIAC CATHETERIZATION     CATARACT EXTRACTION Right 12/2022   CATARACT  EXTRACTION  01/2023   COLONOSCOPY  08/30/2005   Per Records from [place] N/A 07/21/2015   STJ CRTD implanted by Dr Caryl Comes for NICM, CHF   LEFT AND RIGHT HEART CATHETERIZATION WITH CORONARY ANGIOGRAM N/A 04/09/2015   Procedure: LEFT AND RIGHT HEART CATHETERIZATION WITH CORONARY ANGIOGRAM;  Surgeon: Jacolyn Reedy, MD;  Location: [facility] CATH LAB;  Service: Cardiovascular;  Laterality: N/A;   ORIF ELBOW FRACTURE Left 1981   PACEMAKER GENERATOR CHANGE  11/2022   SHOULDER SURGERY Left 05/2008   "had to put cadavear bone in and reattach muscle to it"   TONSILLECTOMY AND ADENOIDECTOMY  1960's   Social History:   reports that he quit smoking about 49 years ago. His smoking use included cigarettes. He started smoking about 52 years ago. He has a 2.00 pack-year smoking history. He has never used smokeless tobacco. He reports current alcohol use of about 4.0 standard drinks of alcohol per week. He reports that he does not use drugs.  Family History  Problem Relation Age of Onset   CVA Mother    High Cholesterol Mother        Per Records from [place] Mother        Per Records from [place]    High blood pressure Mother        Per Records from [place] cancer Father    Diabetes Father    Colon polyps Father        Precamcerous, Per Records from [place]    High blood pressure Sister    Colon cancer Neg Hx    Esophageal cancer Neg Hx    Rectal cancer Neg Hx    Stomach cancer Neg Hx     Medications: Patient's Medications  New Prescriptions   No medications on file  Previous Medications   CARVEDILOL (COREG) 12.5 MG TABLET    TAKE 1 TABLET BY MOUTH 2 TIMES DAILY WITH A MEAL. NEEDS FOLLOW UP APPOINTMENT FOR MORE REFILLS   EPLERENONE (INSPRA) 50 MG TABLET    Take 1 tablet (50 mg total) by mouth daily.   FUROSEMIDE (LASIX) 20 MG TABLET    TAKE 1 TABLET (20 MG TOTAL) BY MOUTH DAILY AS NEEDED. NEED FOLLOW UP  APPOINTMENT FOR ANYMORE REFILLS   LORATADINE (CLARITIN) 10 MG TABLET    Take 10 mg by mouth as needed for allergies.   MULTIPLE VITAMINS-MINERALS (CENTRUM SILVER 50+MEN) TABS    Take 1 tablet by mouth daily.   SACUBITRIL-VALSARTAN (ENTRESTO) 49-51 MG    Take 1 tablet by mouth 2 (two) times daily.  Modified Medications   No medications on file  Discontinued Medications   BESIVANCE 0.6 % SUSP    Apply 1 drop to eye 3 (three) times daily.   PREDNISOLONE ACETATE (PRED FORTE) 1 % OPHTHALMIC  SUSPENSION    Place 1 drop into the right eye in the morning and at bedtime.   PROLENSA 0.07 % SOLN    Place 1 drop into the right eye at bedtime.    Physical Exam:  Vitals:   03/14/23 0803  BP: 128/80  Pulse: 70  Temp: 97.9 F (36.6 C)  TempSrc: Temporal  SpO2: 98%  Weight: 242 lb (109.8 kg)  Height: 6\' 1"  (1.854 m)   Body mass index is 31.93 kg/m. Wt Readings from Last 3 Encounters:  03/14/23 242 lb (109.8 kg)  01/19/23 239 lb 9.6 oz (108.7 kg)  01/05/23 241 lb 6.4 oz (109.5 kg)    Physical Exam Vitals reviewed.  Cardiovascular:     Rate and Rhythm: Normal rate and regular rhythm.     Heart sounds: Normal heart sounds. No murmur heard. Pulmonary:     Breath sounds: Normal breath sounds. No wheezing or rales.  Abdominal:     General: Bowel sounds are normal.     Palpations: Abdomen is soft. There is no mass.     Tenderness: There is no abdominal tenderness. There is no guarding.  Musculoskeletal:        General: Tenderness present. No swelling.     Comments: Left heel pain  Skin:    General: Skin is warm and dry.  Neurological:     Mental Status: He is alert and oriented to person, place, and time. Mental status is at baseline.  Psychiatric:        Mood and Affect: Mood normal.        Behavior: Behavior normal.        Judgment: Judgment normal.     Labs reviewed: Basic Metabolic Panel: Recent Labs    09/13/22 0855 10/01/22 0802  NA 141 139  K 4.3 4.7  CL 108 105  CO2  20 24  GLUCOSE 122* 147*  BUN 16 16  CREATININE 1.18 0.97  CALCIUM 9.8 9.9   Liver Function Tests: Recent Labs    09/13/22 0855 10/01/22 0802  AST 14 18  ALT 18 29  BILITOT 0.6 0.6  PROT 7.0 7.3   No results for input(s): "LIPASE", "AMYLASE" in the last 8760 hours. No results for input(s): "AMMONIA" in the last 8760 hours. CBC: Recent Labs    09/13/22 0855  WBC 10.3  NEUTROABS 4,532  HGB 16.1  HCT 46.3  MCV 89.0  PLT 301   Lipid Panel: Recent Labs    10/01/22 0802  CHOL 157  HDL 50  LDLCALC 82  TRIG 157*  CHOLHDL 3.1   TSH: No results for input(s): "TSH" in the last 8760 hours. A1C: No results found for: "HGBA1C"   Assessment/Plan 1. Elevated glucose Pt admits he enjoys pasta and has difficulty changing his eating. Discussed cutting back on carbohydrates and effect on blood sugar.  - Hemoglobin A1c  2. Chronic systolic CHF (congestive heart failure) ([place]) Pt follows with EP and cardiology. Had pacemaker placed in December. He is managing well. Continue recommendations per cardiology. Continue daily weights and low salt diet.  - Complete Metabolic Panel with eGFR  3. Essential hypertension BP at goal. Continue on current medications and low salt diet.    4. Class 1 obesity due to excess calories with serious comorbidity and body mass index (BMI) of 30.0 to 30.9 in adult Pt walks daily but has cut back due to recent development of heel pain. Discussed reducing carbohydrates; patient enjoys pasta. He still works often and does  not have time to exercise as much as he would like.  5. Seasonal allergies Managed well on claritin. Avoid environmental triggers as able. Recommended flonase nasal spray if he needs additional assistance controlling his symptoms.   6. Pain of left heel Recommended pt try aleve BID x 5 days and supportive shoes. Ice TID If he does not see improvement then will refer to podiatry.    Return in about 6 months (around 09/14/2023) for  routine follow up .  Student- Archer Asa O'Berry ACPCNP-S  I personally was present during the history, physical exam and medical decision-making activities of this service and have verified that the service and findings are accurately documented in the student's note Alya Smaltz K. Orick, [facility] [phone]

## 2023-03-15 LAB — COMPLETE METABOLIC PANEL WITH GFR
AG Ratio: 1.7 (calc) (ref 1.0–2.5)
ALT: 26 U/L (ref 9–46)
AST: 17 U/L (ref 10–35)
Albumin: 4.5 g/dL (ref 3.6–5.1)
Alkaline phosphatase (APISO): 62 U/L (ref 35–144)
BUN: 16 mg/dL (ref 7–25)
CO2: 19 mmol/L — ABNORMAL LOW (ref 20–32)
Calcium: 9.8 mg/dL (ref 8.6–10.3)
Chloride: 106 mmol/L (ref 98–110)
Creat: 1.1 mg/dL (ref 0.70–1.35)
Globulin: 2.7 g/dL (calc) (ref 1.9–3.7)
Glucose, Bld: 106 mg/dL — ABNORMAL HIGH (ref 65–99)
Potassium: 4.5 mmol/L (ref 3.5–5.3)
Sodium: 137 mmol/L (ref 135–146)
Total Bilirubin: 0.6 mg/dL (ref 0.2–1.2)
Total Protein: 7.2 g/dL (ref 6.1–8.1)
eGFR: 73 mL/min/{1.73_m2} (ref >=60)

## 2023-03-15 LAB — HEMOGLOBIN A1C
Hgb A1c MFr Bld: 6.3 % of total Hgb — ABNORMAL HIGH (ref <5.7)
Mean Plasma Glucose: 134 mg/dL
eAG (mmol/L): 7.4 mmol/L

## 2023-03-25 ENCOUNTER — Ambulatory Visit (INDEPENDENT_AMBULATORY_CARE_PROVIDER_SITE_OTHER): Payer: Medicare Other

## 2023-03-25 DIAGNOSIS — I428 Other cardiomyopathies: Secondary | ICD-10-CM

## 2023-03-25 LAB — CUP PACEART REMOTE DEVICE CHECK
Battery Remaining Longevity: 64 mo
Battery Remaining Percentage: 88 %
Battery Voltage: 3.04 V
Brady Statistic AP VP Percent: 24 %
Brady Statistic AP VS Percent: 1 %
Brady Statistic AS VP Percent: 76 %
Brady Statistic AS VS Percent: 1 %
Brady Statistic RA Percent Paced: 24 %
Date Time Interrogation Session: 20240405033702
HighPow Impedance: 74 Ohm
HighPow Impedance: 74 Ohm
Implantable Lead Connection Status: 753985
Implantable Lead Connection Status: 753985
Implantable Lead Connection Status: 753985
Implantable Lead Implant Date: 20160801
Implantable Lead Implant Date: 20160801
Implantable Lead Implant Date: 20160801
Implantable Lead Location: 753858
Implantable Lead Location: 753859
Implantable Lead Location: 753860
Implantable Lead Model: 7122
Implantable Pulse Generator Implant Date: 20231006
Lead Channel Impedance Value: 1150 Ohm
Lead Channel Impedance Value: 450 Ohm
Lead Channel Impedance Value: 450 Ohm
Lead Channel Pacing Threshold Amplitude: 0.75 V
Lead Channel Pacing Threshold Amplitude: 1.25 V
Lead Channel Pacing Threshold Amplitude: 1.25 V
Lead Channel Pacing Threshold Pulse Width: 0.5 ms
Lead Channel Pacing Threshold Pulse Width: 0.6 ms
Lead Channel Pacing Threshold Pulse Width: 1.5 ms
Lead Channel Sensing Intrinsic Amplitude: 11.7 mV
Lead Channel Sensing Intrinsic Amplitude: 2.8 mV
Lead Channel Setting Pacing Amplitude: 2 V
Lead Channel Setting Pacing Amplitude: 2.5 V
Lead Channel Setting Pacing Amplitude: 2.5 V
Lead Channel Setting Pacing Pulse Width: 0.6 ms
Lead Channel Setting Pacing Pulse Width: 1.5 ms
Lead Channel Setting Sensing Sensitivity: 0.5 mV
Pulse Gen Serial Number: 8927074

## 2023-04-11 ENCOUNTER — Encounter: Payer: Self-pay | Admitting: Nurse Practitioner

## 2023-04-11 DIAGNOSIS — L989 Disorder of the skin and subcutaneous tissue, unspecified: Secondary | ICD-10-CM

## 2023-04-22 ENCOUNTER — Other Ambulatory Visit (HOSPITAL_COMMUNITY): Payer: Self-pay | Admitting: Cardiology

## 2023-04-26 NOTE — Progress Notes (Signed)
Remote ICD transmission.   

## 2023-04-28 DIAGNOSIS — L821 Other seborrheic keratosis: Secondary | ICD-10-CM | POA: Diagnosis not present

## 2023-04-28 DIAGNOSIS — X32XXXA Exposure to sunlight, initial encounter: Secondary | ICD-10-CM | POA: Diagnosis not present

## 2023-04-28 DIAGNOSIS — L570 Actinic keratosis: Secondary | ICD-10-CM | POA: Diagnosis not present

## 2023-06-24 ENCOUNTER — Ambulatory Visit (INDEPENDENT_AMBULATORY_CARE_PROVIDER_SITE_OTHER): Payer: Medicare Other

## 2023-06-24 DIAGNOSIS — I428 Other cardiomyopathies: Secondary | ICD-10-CM

## 2023-06-24 LAB — CUP PACEART REMOTE DEVICE CHECK
Battery Remaining Longevity: 60 mo
Battery Remaining Percentage: 84 %
Battery Voltage: 3.02 V
Brady Statistic AP VP Percent: 27 %
Brady Statistic AP VS Percent: 1 %
Brady Statistic AS VP Percent: 72 %
Brady Statistic AS VS Percent: 1 %
Brady Statistic RA Percent Paced: 27 %
Date Time Interrogation Session: 20240705021402
HighPow Impedance: 78 Ohm
HighPow Impedance: 78 Ohm
Implantable Lead Connection Status: 753985
Implantable Lead Connection Status: 753985
Implantable Lead Connection Status: 753985
Implantable Lead Implant Date: 20160801
Implantable Lead Implant Date: 20160801
Implantable Lead Implant Date: 20160801
Implantable Lead Location: 753858
Implantable Lead Location: 753859
Implantable Lead Location: 753860
Implantable Lead Model: 7122
Implantable Pulse Generator Implant Date: 20231006
Lead Channel Impedance Value: 1250 Ohm
Lead Channel Impedance Value: 430 Ohm
Lead Channel Impedance Value: 460 Ohm
Lead Channel Pacing Threshold Amplitude: 0.75 V
Lead Channel Pacing Threshold Amplitude: 1.25 V
Lead Channel Pacing Threshold Amplitude: 1.25 V
Lead Channel Pacing Threshold Pulse Width: 0.5 ms
Lead Channel Pacing Threshold Pulse Width: 0.6 ms
Lead Channel Pacing Threshold Pulse Width: 1.5 ms
Lead Channel Sensing Intrinsic Amplitude: 12 mV
Lead Channel Sensing Intrinsic Amplitude: 3.6 mV
Lead Channel Setting Pacing Amplitude: 2 V
Lead Channel Setting Pacing Amplitude: 2.5 V
Lead Channel Setting Pacing Amplitude: 2.5 V
Lead Channel Setting Pacing Pulse Width: 0.6 ms
Lead Channel Setting Pacing Pulse Width: 1.5 ms
Lead Channel Setting Sensing Sensitivity: 0.5 mV
Pulse Gen Serial Number: 8927074

## 2023-06-26 ENCOUNTER — Other Ambulatory Visit (HOSPITAL_COMMUNITY): Payer: Self-pay | Admitting: Cardiology

## 2023-07-11 NOTE — Progress Notes (Signed)
Remote ICD transmission.   

## 2023-08-21 ENCOUNTER — Other Ambulatory Visit (HOSPITAL_COMMUNITY): Payer: Self-pay | Admitting: Cardiology

## 2023-09-05 DIAGNOSIS — I10 Essential (primary) hypertension: Secondary | ICD-10-CM | POA: Diagnosis not present

## 2023-09-05 DIAGNOSIS — H43813 Vitreous degeneration, bilateral: Secondary | ICD-10-CM | POA: Diagnosis not present

## 2023-09-05 DIAGNOSIS — Z961 Presence of intraocular lens: Secondary | ICD-10-CM | POA: Diagnosis not present

## 2023-09-12 ENCOUNTER — Ambulatory Visit: Payer: Medicare Other | Admitting: Nurse Practitioner

## 2023-09-23 ENCOUNTER — Ambulatory Visit (INDEPENDENT_AMBULATORY_CARE_PROVIDER_SITE_OTHER): Payer: Medicare Other

## 2023-09-23 DIAGNOSIS — I428 Other cardiomyopathies: Secondary | ICD-10-CM | POA: Diagnosis not present

## 2023-09-28 LAB — CUP PACEART REMOTE DEVICE CHECK
Battery Remaining Longevity: 56 mo
Battery Remaining Percentage: 81 %
Battery Voltage: 2.99 V
Brady Statistic AP VP Percent: 29 %
Brady Statistic AP VS Percent: 1 %
Brady Statistic AS VP Percent: 71 %
Brady Statistic AS VS Percent: 1 %
Brady Statistic RA Percent Paced: 29 %
Date Time Interrogation Session: 20241006095802
HighPow Impedance: 74 Ohm
HighPow Impedance: 74 Ohm
Implantable Lead Connection Status: 753985
Implantable Lead Connection Status: 753985
Implantable Lead Connection Status: 753985
Implantable Lead Implant Date: 20160801
Implantable Lead Implant Date: 20160801
Implantable Lead Implant Date: 20160801
Implantable Lead Location: 753858
Implantable Lead Location: 753859
Implantable Lead Location: 753860
Implantable Lead Model: 7122
Implantable Pulse Generator Implant Date: 20231006
Lead Channel Impedance Value: 1100 Ohm
Lead Channel Impedance Value: 390 Ohm
Lead Channel Impedance Value: 440 Ohm
Lead Channel Pacing Threshold Amplitude: 0.75 V
Lead Channel Pacing Threshold Amplitude: 1.25 V
Lead Channel Pacing Threshold Amplitude: 1.25 V
Lead Channel Pacing Threshold Pulse Width: 0.5 ms
Lead Channel Pacing Threshold Pulse Width: 0.6 ms
Lead Channel Pacing Threshold Pulse Width: 1.5 ms
Lead Channel Sensing Intrinsic Amplitude: 12 mV
Lead Channel Sensing Intrinsic Amplitude: 2.4 mV
Lead Channel Setting Pacing Amplitude: 2 V
Lead Channel Setting Pacing Amplitude: 2.5 V
Lead Channel Setting Pacing Amplitude: 2.5 V
Lead Channel Setting Pacing Pulse Width: 0.6 ms
Lead Channel Setting Pacing Pulse Width: 1.5 ms
Lead Channel Setting Sensing Sensitivity: 0.5 mV
Pulse Gen Serial Number: 8927074

## 2023-10-05 NOTE — Progress Notes (Signed)
Remote ICD transmission.   

## 2023-10-17 ENCOUNTER — Encounter: Payer: Self-pay | Admitting: Nurse Practitioner

## 2023-10-17 ENCOUNTER — Other Ambulatory Visit: Payer: Self-pay

## 2023-10-17 ENCOUNTER — Telehealth (HOSPITAL_COMMUNITY): Payer: Self-pay

## 2023-10-17 ENCOUNTER — Ambulatory Visit (INDEPENDENT_AMBULATORY_CARE_PROVIDER_SITE_OTHER): Payer: Medicare Other | Admitting: Nurse Practitioner

## 2023-10-17 VITALS — BP 126/80 | HR 70 | Temp 97.9°F | Ht 73.0 in | Wt 234.0 lb

## 2023-10-17 DIAGNOSIS — E66811 Obesity, class 1: Secondary | ICD-10-CM

## 2023-10-17 DIAGNOSIS — R7309 Other abnormal glucose: Secondary | ICD-10-CM

## 2023-10-17 DIAGNOSIS — I10 Essential (primary) hypertension: Secondary | ICD-10-CM

## 2023-10-17 DIAGNOSIS — E785 Hyperlipidemia, unspecified: Secondary | ICD-10-CM | POA: Diagnosis not present

## 2023-10-17 DIAGNOSIS — I5022 Chronic systolic (congestive) heart failure: Secondary | ICD-10-CM

## 2023-10-17 DIAGNOSIS — E6609 Other obesity due to excess calories: Secondary | ICD-10-CM

## 2023-10-17 DIAGNOSIS — Z6830 Body mass index (BMI) 30.0-30.9, adult: Secondary | ICD-10-CM

## 2023-10-17 NOTE — Progress Notes (Signed)
Careteam: Patient Care Team: Sharon Seller, NP as PCP - General (Geriatric Medicine) Duke Salvia, MD as PCP - Electrophysiology (Cardiology) Othella Boyer, MD as Consulting Physician (Cardiology) Laurey Morale, MD as Consulting Physician (Cardiology) Duke Salvia, MD as Consulting Physician (Cardiology)  PLACE OF SERVICE:  [facility]  Advanced Directive information    No Known Allergies  Chief Complaint  Patient presents with   Medical Management of Chronic Issues    6 month follow-up. Discuss need for AWV.     HPI: Patient is a 72 y.o. male presents for a 6 month follow-up.   No concerns. States he is doing well.   Next appointment with EP will be in November and cardiology early next year. No issues with his pacemaker. Doesn't really need to use his PRN lasix, denies bilateral lower extremity edema or activity intolerance. No shortness of breath, except with extreme exertion on rare ocassions.   Has cut out white flours, added more greens into his diet, more water, cutting back on sugars/salts, limiting processed foods.  Walks about 2 miles/day.  Hx of actinic keratoses: will have annual follow-up with dermatology before the year is over.   Had covid in July but recovered well.  Review of Systems:  Review of Systems  Constitutional:  Negative for chills, fever, malaise/fatigue and weight loss.  Respiratory:  Negative for cough and shortness of breath.   Cardiovascular:  Negative for chest pain, palpitations and leg swelling.  Gastrointestinal:  Negative for constipation, diarrhea, nausea and vomiting.  Genitourinary:  Negative for dysuria, frequency and urgency.  Musculoskeletal:  Negative for back pain, joint pain and myalgias.  Neurological:  Negative for dizziness, weakness and headaches.  Psychiatric/Behavioral:  Negative for depression. The patient is not nervous/anxious and does not have insomnia.     Past Medical History:  Diagnosis  Date   Actinic keratoses    Per Records from Dr.Kevin Little    Cardiomyopathy (HCC) 04/07/2015   Chronic systolic CHF (congestive heart failure) (HCC) 04/14/2015   a. s/p STJ CRTD b. EF normalized with CRT therapy   Colon polyp    Per Records from Dr.Kevin Little    Elevated PSA    Followed by Dr. 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   placed by Dr.McLean, Per Records from Dr.Kevin Little    LBBB (left bundle branch block)    Non-ischemic cardiomyopathy (HCC)    Per Records from Dr.Kevin Little    Shortness of breath    Per Records from Dr.Kevin Little    Syncope 05/30/2015   Past Surgical History:  Procedure Laterality Date   BIV ICD GENERATOR CHANGEOUT N/A 09/24/2022   Procedure: BIV ICD GENERATOR CHANGEOUT;  Surgeon: Duke Salvia, MD;  Location: [facility] INVASIVE CV LAB;  Service: Cardiovascular;  Laterality: N/A;   CARDIAC CATHETERIZATION     CATARACT EXTRACTION Right 12/2022   CATARACT EXTRACTION  01/2023   COLONOSCOPY  08/30/2005   Per Records from Dr.Kevin Little    EP IMPLANTABLE DEVICE N/A 07/21/2015   STJ CRTD implanted by Dr Graciela Husbands for NICM, CHF   LEFT AND RIGHT HEART CATHETERIZATION WITH CORONARY ANGIOGRAM N/A 04/09/2015   Procedure: LEFT AND RIGHT HEART CATHETERIZATION WITH CORONARY ANGIOGRAM;  Surgeon: Othella Boyer, MD;  Location[facility] C[facility];  Service: Cardiovascular;  Laterality: N/A;   ORIF ELBOW FRACTURE Left 1981   PACEMAKER GENERATOR CHANGE  11/2022   SHOULDER SURGERY Left 05/2008   "had to put cadavear  bone in and reattach muscle to it"   TONSILLECTOMY AND ADENOIDECTOMY  1960's   Social History:   reports that he quit smoking about 49 years ago. His smoking use included cigarettes. He started smoking about 52 years ago. He has a 3 pack-year smoking history. He has never used smokeless tobacco. He reports current alcohol use of about 4.0 standard drinks of alcohol per week. He reports that he does not use drugs.  Family History  Problem Relation Age of Onset   CVA Mother    High Cholesterol Mother        Per Records from Dr.Kevin Little    Cataracts Mother        Per Records from Dr.Kevin Little    High blood pressure Mother        Per Records from Dr.Kevin Little    Bone cancer Father    Diabetes Father    Colon polyps Father        Precamcerous, Per Records from Dr.Kevin Little    High blood pressure Sister    Colon cancer Neg Hx    Esophageal cancer Neg Hx    Rectal cancer Neg Hx    Stomach cancer Neg Hx     Medications: Patient's Medications  New Prescriptions   No medications on file  Previous Medications   CARVEDILOL (COREG) 12.5 MG TABLET    TAKE 1 TABLET BY MOUTH 2 TIMES DAILY WITH A MEAL. NEEDS FOLLOW UP APPOINTMENT FOR MORE REFILLS   ENTRESTO 49-51 MG    TAKE 1 TABLET BY MOUTH TWICE A DAY   EPLERENONE (INSPRA) 50 MG TABLET    Take 1 tablet (50 mg total) by mouth daily.   FUROSEMIDE (LASIX) 20 MG TABLET    TAKE 1 TABLET (20 MG TOTAL) BY MOUTH DAILY AS NEEDED. NEED FOLLOW UP APPOINTMENT FOR ANYMORE REFILLS   LORATADINE (CLARITIN) 10 MG TABLET    Take 10 mg by mouth as needed for allergies.   MULTIPLE VITAMINS-MINERALS (CENTRUM SILVER 50+MEN) TABS    Take 1 tablet by mouth daily.  Modified Medications   No medications on file  Discontinued Medications   No medications on file    Physical Exam:  Vitals:   10/17/23 0829  BP: 126/80  Pulse: 70  Temp: 97.9 F (36.6 C)  TempSrc: Temporal  SpO2: 97%  Weight: 106.1 kg  Height: 6\' 1"  (1.854 m)   Body mass index  is 30.87 kg/m. Wt Readings from Last 3 Encounters:  10/17/23 234 lb (106.1 kg)  03/14/23 242 lb (109.8 kg)  01/19/23 239 lb 9.6 oz (108.7 kg)    Physical Exam Constitutional:      General: He is not in acute distress.    Appearance: Normal appearance. He is not toxic-appearing or diaphoretic.  Cardiovascular:     Rate and Rhythm: Normal rate and regular rhythm.     Pulses: Normal pulses.     Heart sounds: Normal heart sounds.  Pulmonary:     Effort: Pulmonary effort is normal.     Breath sounds: Normal breath sounds.     Comments: No crackles Abdominal:     General: Bowel sounds are normal.     Palpations: Abdomen is soft.  Musculoskeletal:     Right lower leg: No edema.     Left lower leg: No edema.  Skin:    General: Skin is warm and dry.  Neurological:     General: No focal deficit present.     Mental Status: He is alert and oriented to person, place, and time.  Psychiatric:        Mood and Affect: Mood normal.        Behavior: Behavior normal.     Labs reviewed: Basic Metabolic Panel: Recent Labs    03/14/23 0904  NA 137  K 4.5  CL 106  CO2 19*  GLUCOSE 106*  BUN 16  CREATININE 1.10  CALCIUM 9.8   Liver Function Tests: Recent Labs    03/14/23 0904  AST 17  ALT 26  BILITOT 0.6  PROT 7.2   No results for input(s): "LIPASE", "AMYLASE" in the last 8760 hours. No results for input(s): "AMMONIA" in the last 8760 hours. CBC: No results for input(s): "WBC", "NEUTROABS", "HGB", "HCT", "MCV", "PLT" in the last 8760 hours. Lipid Panel: No results for input(s): "CHOL", "HDL", "LDLCALC", "TRIG", "CHOLHDL", "LDLDIRECT" in the last 8760 hours. TSH: No results for input(s): "TSH" in the last 8760 hours. A1C: Lab Results  Component Value Date   HGBA1C 6.3 (H) 03/14/2023    Assessment/Plan 1. Essential hypertension -Controlled, at goal, <140/90 -Continue current medication regimen -Encouraged dietary modifications/DASH diet and physical activity as  tolerated -- avoid added salt -Lipid panel, CBC, CMP  2. Elevated glucose -Encouraged dietary modifications and physical activity as tolerated -- continue limiting sugar/carbohydrates -A1c  3. Chronic systolic CHF (congestive heart failure) (HCC) -Euvolemic, no bilateral lower extremity edema; no shortness of breath; weight stable -Continue care with cardiology -Last EF 50-50% -s/p [employer] CRT-D (initial implant 2016, generator replaced 2023) -Continue current medications  4. Class 1 obesity due to excess calories with serious comorbidity and body mass index (BMI) of 30.0 to 30.9 in adult -Has lost some weight; continue to adjust diet and increase physical activity as tolerated -- denies activity intolerance, some shortness of breath with excessive activity -Lipid panel, CBC, CMP, A1c   Return in about 6 months (around 04/16/2024) for routine follow up .  Rollen Sox, FNP-MSN Student -I personally was present during the history, physical exam and medical decision-making activities of this service and have verified that the service and findings are accurately documented in the student's note Abbey Chatters, NP

## 2023-10-17 NOTE — Telephone Encounter (Signed)
Advanced Heart Failure Patient Advocate Encounter  The patient was renewed for a [employer] grant that will help cover the cost of Carvedilol, Entresto.  Total amount awarded, $10,000.  Effective: 10/07/2023 - 10/05/2024.  BIN [id number] PCN [id number] [id number] ID [id number]  Pharmacy provided with approval and processing information. Patient informed via phone.  Burnell Blanks, CPhT Rx Patient Advocate Phone: [phone]

## 2023-10-18 LAB — COMPLETE METABOLIC PANEL WITH GFR
AG Ratio: 1.7 (calc) (ref 1.0–2.5)
ALT: 27 U/L (ref 9–46)
AST: 18 U/L (ref 10–35)
Albumin: 4.5 g/dL (ref 3.6–5.1)
Alkaline phosphatase (APISO): 65 U/L (ref 35–144)
BUN: 14 mg/dL (ref 7–25)
CO2: 25 mmol/L (ref 20–32)
Calcium: 9.8 mg/dL (ref 8.6–10.3)
Chloride: 105 mmol/L (ref 98–110)
Creat: 1.07 mg/dL (ref 0.70–1.28)
Globulin: 2.6 g/dL (ref 1.9–3.7)
Glucose, Bld: 107 mg/dL — ABNORMAL HIGH (ref 65–99)
Potassium: 4.6 mmol/L (ref 3.5–5.3)
Sodium: 138 mmol/L (ref 135–146)
Total Bilirubin: 0.5 mg/dL (ref 0.2–1.2)
Total Protein: 7.1 g/dL (ref 6.1–8.1)
eGFR: 75 mL/min/{1.73_m2} (ref >=60)

## 2023-10-18 LAB — LIPID PANEL
Cholesterol: 147 mg/dL (ref <200)
HDL: 51 mg/dL (ref >=40)
LDL Cholesterol (Calc): 78 mg/dL
Non-HDL Cholesterol (Calc): 96 mg/dL (ref <130)
Total CHOL/HDL Ratio: 2.9 (calc) (ref <5.0)
Triglycerides: 98 mg/dL (ref <150)

## 2023-10-18 LAB — CBC WITH DIFFERENTIAL/PLATELET
Absolute Lymphocytes: 8078 {cells}/uL — ABNORMAL HIGH (ref 850–3900)
Absolute Monocytes: 805 {cells}/uL (ref 200–950)
Basophils Absolute: 66 {cells}/uL (ref 0–200)
Basophils Relative: 0.5 %
Eosinophils Absolute: 330 {cells}/uL (ref 15–500)
Eosinophils Relative: 2.5 %
HCT: 48.7 % (ref 38.5–50.0)
Hemoglobin: 16.2 g/dL (ref 13.2–17.1)
MCH: 30.5 pg (ref 27.0–33.0)
MCHC: 33.3 g/dL (ref 32.0–36.0)
MCV: 91.7 fL (ref 80.0–100.0)
MPV: 10.8 fL (ref 7.5–12.5)
Monocytes Relative: 6.1 %
Neutro Abs: 3920 {cells}/uL (ref 1500–7800)
Neutrophils Relative %: 29.7 %
Platelets: 288 10*3/uL (ref 140–400)
RBC: 5.31 10*6/uL (ref 4.20–5.80)
RDW: 13.1 % (ref 11.0–15.0)
Total Lymphocyte: 61.2 %
WBC: 13.2 10*3/uL — ABNORMAL HIGH (ref 3.8–10.8)

## 2023-10-18 LAB — HEMOGLOBIN A1C
Hgb A1c MFr Bld: 6.3 %{Hb} — ABNORMAL HIGH (ref <5.7)
Mean Plasma Glucose: 134 mg/dL
eAG (mmol/L): 7.4 mmol/L

## 2023-10-31 DIAGNOSIS — L570 Actinic keratosis: Secondary | ICD-10-CM | POA: Diagnosis not present

## 2023-10-31 DIAGNOSIS — X32XXXD Exposure to sunlight, subsequent encounter: Secondary | ICD-10-CM | POA: Diagnosis not present

## 2023-11-07 ENCOUNTER — Encounter: Payer: Self-pay | Admitting: Nurse Practitioner

## 2023-11-07 ENCOUNTER — Ambulatory Visit (INDEPENDENT_AMBULATORY_CARE_PROVIDER_SITE_OTHER): Payer: Medicare Other | Admitting: Nurse Practitioner

## 2023-11-07 DIAGNOSIS — Z0000 Encounter for general adult medical examination without abnormal findings: Secondary | ICD-10-CM | POA: Diagnosis not present

## 2023-11-07 NOTE — Progress Notes (Signed)
Subjective:   Isaiah Wu is a 72 y.o. male who presents for Medicare Annual/Subsequent preventive examination.  Visit Complete: Virtual I connected with  Thornton Papas on 11/07/23 by a video and audio enabled telemedicine application and verified that I am speaking with the correct person using two identifiers.  Patient Location: Home  Provider Location: Office/Clinic  I discussed the limitations of evaluation and management by telemedicine. The patient expressed understanding and agreed to proceed.  Vital Signs: Because this visit was a virtual/telehealth visit, some criteria may be missing or patient reported. Any vitals not documented were not able to be obtained and vitals that have been documented are patient reported.  Cardiac Risk Factors include: advanced age (>9men, >68 women);obesity (BMI >30kg/m2);male gender;hypertension     Objective:    There were no vitals filed for this visit. There is no height or weight on file to calculate BMI.     11/07/2023    8:12 AM 03/14/2023    8:33 AM 10/29/2022    8:55 AM 09/24/2022    8:50 AM 03/12/2022    8:34 AM 10/19/2021    8:30 AM 09/14/2021    8:32 AM  Advanced Directives  Does Patient Have a Medical Advance Directive? No No No No No No No  Does patient want to make changes to medical advance directive?     Yes (MAU/Ambulatory/Procedural Areas - Information given)    Would patient like information on creating a medical advance directive? No - Patient declined No - Patient declined No - Patient declined No - Patient declined  Yes (MAU/Ambulatory/Procedural Areas - Information given) Yes (MAU/Ambulatory/Procedural Areas - Information given)    Current Medications (verified) Outpatient Encounter Medications as of 11/07/2023  Medication Sig   carvedilol (COREG) 12.5 MG tablet TAKE 1 TABLET BY MOUTH 2 TIMES DAILY WITH A MEAL. NEEDS FOLLOW UP APPOINTMENT FOR MORE REFILLS   ENTRESTO 49-51 MG TAKE 1 TABLET BY MOUTH TWICE  A DAY   eplerenone (INSPRA) 50 MG tablet Take 1 tablet (50 mg total) by mouth daily.   furosemide (LASIX) 20 MG tablet TAKE 1 TABLET (20 MG TOTAL) BY MOUTH DAILY AS NEEDED. NEED FOLLOW UP APPOINTMENT FOR ANYMORE REFILLS   loratadine (CLARITIN) 10 MG tablet Take 10 mg by mouth as needed for allergies.   Multiple Vitamins-Minerals (CENTRUM SILVER 50+MEN) TABS Take 1 tablet by mouth daily.   No facility-administered encounter medications on file as of 11/07/2023.    Allergies (verified) Patient has no known allergies.   History: Past Medical History:  Diagnosis Date   Actinic keratoses    Per Records from Dr.Kevin Little    Cardiomyopathy (HCC) 04/07/2015   Chronic systolic CHF (congestive heart failure) (HCC) 04/14/2015   a. s/p STJ CRTD b. EF normalized with CRT therapy   Colon polyp    Per Records from Dr.Kevin Little    Elevated PSA    Followed by Dr. 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   placed by Dr.McLean, Per Records from Dr.Kevin Little    LBBB (left bundle branch block)    Non-ischemic cardiomyopathy (HCC)    Per Records from Dr.Kevin Little    Shortness of breath    Per Records from Dr.Kevin Little    Syncope 05/30/2015   Past Surgical History:  Procedure Laterality Date   BIV ICD GENERATOR CHANGEOUT N/A 09/24/2022   Procedure: BIV ICD GENERATOR CHANGEOUT;  Surgeon: Duke Salvia, MD;  Location: [facility] INVASIVE CV LAB;  Service: Cardiovascular;  Laterality: N/A;   CARDIAC CATHETERIZATION     CATARACT EXTRACTION  Right 12/2022   CATARACT EXTRACTION  01/2023   COLONOSCOPY  08/30/2005   Per Records from Dr.Kevin Little    EP IMPLANTABLE DEVICE N/A 07/21/2015   STJ CRTD implanted by Dr Graciela Husbands for NICM, CHF   LEFT AND RIGHT HEART CATHETERIZATION WITH CORONARY ANGIOGRAM N/A 04/09/2015   Procedure: LEFT AND RIGHT HEART CATHETERIZATION WITH CORONARY ANGIOGRAM;  Surgeon: Othella Boyer, MD;  Location: [facility] CATH LAB;  Service: Cardiovascular;  Laterality: N/A;   ORIF ELBOW FRACTURE Left 1981   PACEMAKER GENERATOR CHANGE  11/2022   SHOULDER SURGERY Left 05/2008   "had to put cadavear bone in and reattach muscle to it"   TONSILLECTOMY AND ADENOIDECTOMY  1960's   Family History  Problem Relation Age of Onset   CVA Mother    High Cholesterol Mother        Per Records from Dr.Kevin Little    Cataracts Mother        Per Records from Dr.Kevin Little    High blood pressure Mother        Per Records from Dr.Kevin Little    Bone cancer Father    Diabetes Father    Colon polyps Father        Precamcerous, Per Records from Dr.Kevin Little    High blood pressure Sister    Colon cancer Neg Hx    Esophageal cancer Neg Hx    Rectal cancer Neg Hx    Stomach cancer Neg Hx    Social History   Socioeconomic History   Marital status: Married    Spouse name: Not on file   Number of children: Not on file   Years of education: Not on file   Highest education level: Associate degree: academic program  Occupational History   Not on file  Tobacco Use   Smoking status: Former    Current packs/day: 0.00    Average packs/day: 1 pack/day for 3.0 years (3.0 ttl pk-yrs)    Types: Cigarettes    Start date: 12/20/1970    Quit date: 12/20/1973    Years since quitting: 49.9   Smokeless tobacco: Never   Tobacco comments:    'quit smoking in the 1970's"  Vaping Use   Vaping status: Never Used  Substance and Sexual Activity   Alcohol use: Yes    Alcohol/week: 4.0 standard drinks of alcohol    Types: 4 Glasses of wine  per week   Drug use: No   Sexual activity: Yes  Other Topics Concern   Not on file  Social History Narrative   Diet No      Do you drink/eat things with caffeine Yes, 2 cups of coffee daily      Marital Status Yes What year were you married? 1976      Do you live in a house, apartment, assisted living, condo, trailer, etc.? House  Is it one or more stories? Yes      How many persons live in your home? 2         Do you have any pets in your home?(please list) No      Highest level of education completed: Associate      Current or past profession:Supply chain executive-Now volunteering      Do you exercise?: Yes  Type and how often: Ellipitical cycling 5 days a week      Do you have a Living Will? No      Do you have a DNR form?  No       If not, would you like to discuss one? Yes      Do you have signed POA/HPOA forms? No      Do you have difficulty bathing or dressing yourself? No      Do you have difficulty preparing food or eating? No      Do you have difficulty managing medications? No      Do you have difficulty managing your finances? No      Do you have difficulty affording your medications? No                  Social Determinants of Health   Financial Resource Strain: Low Risk  (10/16/2023)   Overall Financial Resource Strain (CARDIA)    Difficulty of Paying Living Expenses: Not hard at all  Food Insecurity: No Food Insecurity (10/16/2023)   Hunger Vital Sign    Worried About Running Out of Food in the Last Year: Never true    Ran Out of Food in the Last Year: Never true  Transportation Needs: No Transportation Needs (10/16/2023)   PRAPARE - Administrator, Civil Service (Medical): No    Lack of Transportation (Non-Medical): No  Physical Activity: Insufficiently Active (10/16/2023)   Exercise Vital Sign    Days of Exercise per Week: 4 days    Minutes of Exercise per Session: 30 min  Stress: No Stress Concern Present (10/16/2023)    [employer] of Occupational Health - Occupational Stress Questionnaire    Feeling of Stress : Not at all  Social Connections: Moderately Integrated (10/16/2023)   Social Connection and Isolation Panel [NHANES]    Frequency of Communication with Friends and Family: More than three times a week    Frequency of Social Gatherings with Friends and Family: Twice a week    Attends Religious Services: Never    Database administrator or Organizations: No    Attends Engineer, structural: More than 4 times per year    Marital Status: Married    Tobacco Counseling Counseling given: Not Answered Tobacco comments: 'quit smoking in the 1970's"   Clinical Intake:  Pre-visit preparation completed: Yes  Pain : No/denies pain     BMI - recorded: 30.87 Diabetes: No  How often do you need to have someone help you when you read instructions, pamphlets, or other written materials from your doctor or pharmacy?: 1 - Never         Activities of Daily Living    11/07/2023    8:24 AM  In your present state of health, do you have any difficulty performing the following activities:  Hearing? 1  Vision? 0  Difficulty concentrating or making decisions? 0  Walking or climbing stairs? 0  Dressing or bathing? 0  Doing errands, shopping? 0  Preparing Food and eating ? N  Using  the Toilet? N  In the past six months, have you accidently leaked urine? N  Do you have problems with loss of bowel control? N  Managing your Medications? N  Managing your Finances? N  Housekeeping or managing your Housekeeping? N    Patient Care Team: Sharon Seller, NP as PCP - General (Geriatric Medicine) Duke Salvia, MD as PCP - Electrophysiology (Cardiology) Othella Boyer, MD as Consulting Physician (Cardiology) Laurey Morale, MD as Consulting Physician (Cardiology) Duke Salvia, MD as Consulting Physician (Cardiology)  Indicate any recent Medical Services you may have received  from other than [facility] providers in the past year (date may be approximate).     Assessment:   This is a routine wellness examination for Hansen.  Hearing/Vision screen Hearing Screening - Comments:: No hearing issues  Vision Screening - Comments:: Last eye exam less than 12 months ago. [facility]    Goals Addressed             This Visit's Progress    Patient Stated       To get 7K steps a day on the weekend and 3500K during the week        Depression Screen    11/07/2023    8:10 AM 10/17/2023    8:27 AM 03/14/2023    8:33 AM 10/29/2022    8:54 AM 03/12/2022    8:33 AM 10/19/2021    8:30 AM 09/14/2021    8:31 AM  PHQ 2/9 Scores  PHQ - 2 Score 0 0 0 0 0 0 0    Fall Risk    11/07/2023    8:10 AM 10/17/2023    8:27 AM 03/14/2023    8:33 AM 10/29/2022    8:54 AM 09/13/2022    8:19 AM  Fall Risk   Falls in the past year? 0 0 0 0 0  Number falls in past yr: 0 0 0 0 0  Injury with Fall? 0 0 0 0 0  Risk for fall due to : No Fall Risks No Fall Risks No Fall Risks No Fall Risks No Fall Risks  Follow up Falls evaluation completed Falls evaluation completed Falls evaluation completed Falls evaluation completed     MEDICARE RISK AT HOME: Medicare Risk at Home Any stairs in or around the home?: Yes If so, are there any without handrails?: No Home free of loose throw rugs in walkways, pet beds, electrical cords, etc?: Yes Adequate lighting in your home to reduce risk of falls?: Yes Life alert?: No Use of a cane, walker or w/c?: No Grab bars in the bathroom?: Yes Shower chair or bench in shower?: Yes Elevated toilet seat or a handicapped toilet?: Yes  TIMED UP AND GO:  Was the test performed?  No    Cognitive Function:        11/07/2023    8:12 AM 10/29/2022    8:54 AM 10/19/2021    8:32 AM 10/16/2020    8:45 AM  6CIT Screen  What Year? 0 points 0 points 0 points 0 points  What month? 0 points 0 points 0 points 0 points  What time? 0 points 0 points 0  points 0 points  Count back from 20 0 points 0 points 0 points 0 points  Months in reverse 0 points 0 points 0 points 0 points  Repeat phrase 0 points 0 points 0 points 0 points  Total Score 0 points 0 points 0 points 0 points  Immunizations Immunization History  Administered Date(s) Administered   Hepatitis B, ADULT 03/06/2015   Influenza, High Dose Seasonal PF 10/09/2018, 09/28/2019, 09/01/2020, 09/09/2021, 10/13/2023   Influenza,inj,Quad PF,6+ Mos 09/17/2016, 11/15/2017   Moderna Covid-19 Fall Seasonal Vaccine 75yrs & older 10/13/2023   Moderna Covid-19 Vaccine Bivalent Booster 28yrs & up 09/09/2021   PFIZER(Purple Top)SARS-COV-2 Vaccination 02/02/2020, 02/24/2020, 10/18/2020   PNEUMOCOCCAL CONJUGATE-20 03/02/2022   Pneumococcal Polysaccharide-23 09/15/2020   Td 12/20/2005   Tdap 09/17/2016, 03/06/2021   Zoster Recombinant(Shingrix) 03/06/2021, 03/02/2022   Zoster, Live 08/29/2015    TDAP status: Up to date  Flu Vaccine status: Up to date  Pneumococcal vaccine status: Up to date  Covid-19 vaccine status: Information provided on how to obtain vaccines.   Qualifies for Shingles Vaccine? Yes   Zostavax completed No   Shingrix Completed?: Yes  Screening Tests Health Maintenance  Topic Date Due   COVID-19 Vaccine (6 - 2023-24 season) 02/13/2024   Medicare Annual Wellness (AWV)  11/06/2024   DTaP/Tdap/Td (4 - Td or Tdap) 03/07/2031   Colonoscopy  07/29/2031   Pneumonia Vaccine 70+ Years old  Completed   INFLUENZA VACCINE  Completed   Hepatitis C Screening  Completed   Zoster Vaccines- Shingrix  Completed   HPV VACCINES  Aged Out    Health Maintenance  There are no preventive care reminders to display for this patient.   Colorectal cancer screening: Type of screening: Colonoscopy. Completed 07/28/2021. Repeat every 10 years  Lung Cancer Screening: (Low Dose CT Chest recommended if Age 53-80 years, 20 pack-year currently smoking OR have quit w/in 15years.) does not  qualify.   Lung Cancer Screening Referral: na  Additional Screening:  Hepatitis C Screening: does qualify; Completed   Vision Screening: Recommended annual ophthalmology exams for early detection of glaucoma and other disorders of the eye. Is the patient up to date with their annual eye exam?  Yes  Who is the provider or what is the name of the office in which the patient attends annual eye exams? [facility] If pt is not established with a provider, would they like to be referred to a provider to establish care? No .   Dental Screening: Recommended annual dental exams for proper oral hygiene   Community Resource Referral / Chronic Care Management: CRR required this visit?  No   CCM required this visit?  No     Plan:     I have personally reviewed and noted the following in the patient's chart:   Medical and social history Use of alcohol, tobacco or illicit drugs  Current medications and supplements including opioid prescriptions. Patient is not currently taking opioid prescriptions. Functional ability and status Nutritional status Physical activity Advanced directives List of other physicians Hospitalizations, surgeries, and ER visits in previous 12 months Vitals Screenings to include cognitive, depression, and falls Referrals and appointments  In addition, I have reviewed and discussed with patient certain preventive protocols, quality metrics, and best practice recommendations. A written personalized care plan for preventive services as well as general preventive health recommendations were provided to patient.     Sharon Seller, NP   11/07/2023   After Visit Summary: (MyChart) Due to this being a telephonic visit, the after visit summary with patients personalized plan was offered to patient via MyChart

## 2023-11-07 NOTE — Progress Notes (Signed)
   This service is provided via telemedicine  No vital signs collected/recorded due to the encounter was a telemedicine visit.   Location of patient (ex: home, work):  Home  Patient consents to a telephone visit: Yes  Location of the provider (ex: office, home):  [facility], Office   Name of any referring provider:  N/A  Names of all persons participating in the telemedicine service and their role in the encounter:  S.Chrae B/CMA, Abbey Chatters, NP, and Patient   Time spent on call:  7 min with medical assistant

## 2023-11-09 ENCOUNTER — Encounter: Payer: Medicare Other | Admitting: Internal Medicine

## 2023-12-06 ENCOUNTER — Encounter: Payer: Self-pay | Admitting: Internal Medicine

## 2023-12-06 ENCOUNTER — Ambulatory Visit: Payer: Medicare Other | Attending: Internal Medicine | Admitting: Internal Medicine

## 2023-12-06 VITALS — BP 108/66 | HR 70 | Ht 73.0 in | Wt 239.2 lb

## 2023-12-06 DIAGNOSIS — Z95810 Presence of automatic (implantable) cardiac defibrillator: Secondary | ICD-10-CM

## 2023-12-06 DIAGNOSIS — I428 Other cardiomyopathies: Secondary | ICD-10-CM | POA: Diagnosis not present

## 2023-12-06 LAB — CUP PACEART INCLINIC DEVICE CHECK
Battery Remaining Longevity: 56 mo
Brady Statistic RA Percent Paced: 27 %
Brady Statistic RV Percent Paced: 99.55 %
Date Time Interrogation Session: 20241217093106
HighPow Impedance: 80 Ohm
Implantable Lead Connection Status: 753985
Implantable Lead Connection Status: 753985
Implantable Lead Connection Status: 753985
Implantable Lead Implant Date: 20160801
Implantable Lead Implant Date: 20160801
Implantable Lead Implant Date: 20160801
Implantable Lead Location: 753858
Implantable Lead Location: 753859
Implantable Lead Location: 753860
Implantable Lead Model: 7122
Implantable Pulse Generator Implant Date: 20231006
Lead Channel Impedance Value: 1150 Ohm
Lead Channel Impedance Value: 410 Ohm
Lead Channel Impedance Value: 430 Ohm
Lead Channel Pacing Threshold Amplitude: 0.75 V
Lead Channel Pacing Threshold Amplitude: 1.25 V
Lead Channel Pacing Threshold Amplitude: 2 V
Lead Channel Pacing Threshold Pulse Width: 0.5 ms
Lead Channel Pacing Threshold Pulse Width: 0.6 ms
Lead Channel Pacing Threshold Pulse Width: 1.5 ms
Lead Channel Sensing Intrinsic Amplitude: 0 mV
Lead Channel Sensing Intrinsic Amplitude: 12 mV
Lead Channel Sensing Intrinsic Amplitude: 2.8 mV
Lead Channel Sensing Intrinsic Amplitude: 3 mV
Lead Channel Setting Pacing Amplitude: 2 V
Lead Channel Setting Pacing Amplitude: 2.5 V
Lead Channel Setting Pacing Amplitude: 2.5 V
Lead Channel Setting Pacing Pulse Width: 0.6 ms
Lead Channel Setting Pacing Pulse Width: 1.5 ms
Lead Channel Setting Sensing Sensitivity: 0.5 mV
Pulse Gen Serial Number: 8927074

## 2023-12-06 NOTE — Patient Instructions (Signed)

## 2023-12-06 NOTE — Progress Notes (Signed)
Patient Care Team: Sharon Seller, NP as PCP - General (Geriatric Medicine) Duke Salvia, MD as PCP - Electrophysiology (Cardiology) Othella Boyer, MD as Consulting Physician (Cardiology) Laurey Morale, MD as Consulting Physician (Cardiology) Duke Salvia, MD as Consulting Physician (Cardiology)   HPI  Isaiah Wu is a 72 y.o. male seen in follow-up for CRT-D Abbott generator replacement 10/23; initial implant 2016  Interval resolution of LV dysfunction   The patient denies chest pain, shortness of breath, nocturnal dyspnea, orthopnea or peripheral edema.  There have been no palpitations, lightheadedness or syncope.    C/o numbness R hand in am > L  DATE TEST EF    4/16 Echo  15%    4/16 LHC   CAs normal  2/17 Echo  45%    10/18 Echo  55%    7/22 Echo  50-55%    1/24 Ca Score  Calcium score 9  bilateral gynecomastia  2/24 Echo   50-55%     Date Cr K Hgb  7/22 1.10 3.9 15.5 (9/22)   9/23 1.18 4.3 16.1  10/24 1.07 4.6 16.2        Device History: STJ CRTD implanted 2016 for NICM, CHF, LBBB History of appropriate therapy: No History of AAD therapy: No   Records and Results Reviewed   Past Medical History:  Diagnosis Date   Actinic keratoses    Per Records from Dr.Kevin Little    Cardiomyopathy (HCC) 04/07/2015   Chronic systolic CHF (congestive heart failure) (HCC) 04/14/2015   a. s/p STJ CRTD b. EF normalized with CRT therapy   Colon polyp    Per Records from Dr.Kevin Little    Elevated PSA    Followed by Dr. 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   placed by Dr.McLean, Per Records from Dr.Kevin Little    LBBB (left bundle branch block)    Non-ischemic cardiomyopathy (HCC)    Per Records from Dr.Kevin Little    Shortness of breath    Per Records from Dr.Kevin Little    Syncope 05/30/2015    Past Surgical History:  Procedure Laterality Date   BIV ICD GENERATOR CHANGEOUT N/A 09/24/2022   Procedure: BIV ICD GENERATOR CHANGEOUT;  Surgeon: Duke Salvia, MD;  Location: [facility] INVASIVE CV LAB;  Service: Cardiovascular;  Laterality: N/A;   CARDIAC CATHETERIZATION     CATARACT EXTRACTION Right 12/2022   CATARACT EXTRACTION  01/2023   COLONOSCOPY  08/30/2005   Per Records from Dr.Kevin Little    EP IMPLANTABLE DEVICE N/A 07/21/2015   STJ CRTD implanted by Dr Graciela Husbands for NICM, CHF   LEFT AND RIGHT HEART CATHETERIZATION WITH CORONARY ANGIOGRAM N/A 04/09/2015   Procedure: LEFT AND RIGHT HEART CATHETERIZATION WITH CORONARY ANGIOGRAM;  Surgeon: Othella Boyer, MD;  Location: [facility] CATH LAB;  Service: Cardiovascular;  Laterality: N/A;   ORIF ELBOW FRACTURE Left 1981   PACEMAKER GENERATOR CHANGE  11/2022   SHOULDER SURGERY Left 05/2008   "had to put cadavear bone in and reattach muscle to it"   TONSILLECTOMY AND ADENOIDECTOMY  1960's    Current Outpatient Medications  Medication Sig Dispense Refill   carvedilol (COREG) 12.5 MG tablet TAKE 1 TABLET BY MOUTH 2 TIMES DAILY WITH A MEAL. NEEDS FOLLOW UP APPOINTMENT FOR MORE REFILLS 180 tablet 1   ENTRESTO 49-51 MG TAKE 1 TABLET BY MOUTH TWICE A DAY 60 tablet 3   eplerenone (INSPRA) 50 MG tablet Take 1 tablet (50 mg total) by mouth daily. 90 tablet 3   furosemide (LASIX) 20 MG tablet TAKE 1 TABLET (20 MG TOTAL) BY MOUTH DAILY AS NEEDED. NEED FOLLOW UP APPOINTMENT FOR ANYMORE REFILLS 90 tablet 0   loratadine (CLARITIN) 10 MG tablet Take 10 mg by mouth as needed for allergies.     Multiple Vitamins-Minerals (CENTRUM  SILVER 50+MEN) TABS Take 1 tablet by mouth daily.     No current facility-administered medications for this visit.    No Known Allergies           Current Meds  Medication Sig   carvedilol (COREG) 12.5 MG tablet TAKE 1 TABLET BY MOUTH 2 TIMES DAILY WITH A MEAL. NEEDS FOLLOW UP APPOINTMENT FOR MORE REFILLS   ENTRESTO 49-51 MG TAKE 1 TABLET BY MOUTH TWICE A DAY   eplerenone (INSPRA) 50 MG tablet Take 1 tablet (50 mg total) by mouth daily.   furosemide (LASIX) 20 MG tablet TAKE 1 TABLET (20 MG TOTAL) BY MOUTH DAILY AS NEEDED. NEED FOLLOW UP APPOINTMENT FOR ANYMORE REFILLS   loratadine (CLARITIN) 10 MG tablet Take 10 mg by mouth as needed for allergies.   Multiple Vitamins-Minerals (CENTRUM SILVER 50+MEN) TABS Take 1 tablet by mouth daily.     Review of Systems negative except from HPI and PMH  Physical Exam BP 108/66   Pulse 70   Ht 6\' 1"  (1.854 m)   Wt 239 lb 3.2 oz (108.5 kg)   SpO2 94%   BMI 31.56 kg/m  Well developed and well nourished in no acute distress HENT normal Neck supple with JVP-flat Clear Device pocket well healed; without hematoma or erythema.  There is no tethering  Regular rate and rhythm, no  gallop No murmur Abd-soft with active BS No Clubbing cyanosis  edema Skin-warm and dry Thenar wasting and weakness of thyumb opposition   A & Oriented  Grossly normal sensory and motor function  ECG sinus  P-synchronous/ AV  pacing  Upright QRS lead V1 and neg lead 1   Device function is normal. Programming changes SAV shortened s2/2 functional PR interval of 260 msec  See Paceart for details     Assessment and  Plan   Nonischemic cardiomyopathy interval near normalization   Congestive heart failure class II--diastolic    CRT-D -Abbott   Carpal tunnel Continue GDMT for his cardiomyopathy.  Euvolemic.  Lasix as needed.  Carpal tunnel.  Suggested he follow-up with his PCP regarding what is the right time to pursue relief so as to avoid irreversible  muscle weakness

## 2023-12-14 ENCOUNTER — Other Ambulatory Visit (HOSPITAL_COMMUNITY): Payer: Self-pay | Admitting: Cardiology

## 2023-12-16 ENCOUNTER — Other Ambulatory Visit (HOSPITAL_COMMUNITY): Payer: Self-pay | Admitting: Cardiology

## 2023-12-23 ENCOUNTER — Ambulatory Visit (INDEPENDENT_AMBULATORY_CARE_PROVIDER_SITE_OTHER): Payer: Medicare Other

## 2023-12-23 DIAGNOSIS — I428 Other cardiomyopathies: Secondary | ICD-10-CM | POA: Diagnosis not present

## 2023-12-23 LAB — CUP PACEART REMOTE DEVICE CHECK
Battery Remaining Longevity: 55 mo
Battery Remaining Percentage: 77 %
Battery Voltage: 2.99 V
Brady Statistic AP VP Percent: 14 %
Brady Statistic AP VS Percent: 1 %
Brady Statistic AS VP Percent: 85 %
Brady Statistic AS VS Percent: 1 %
Brady Statistic RA Percent Paced: 14 %
Date Time Interrogation Session: 20250103021945
HighPow Impedance: 81 Ohm
HighPow Impedance: 81 Ohm
Implantable Lead Connection Status: 753985
Implantable Lead Connection Status: 753985
Implantable Lead Connection Status: 753985
Implantable Lead Implant Date: 20160801
Implantable Lead Implant Date: 20160801
Implantable Lead Implant Date: 20160801
Implantable Lead Location: 753858
Implantable Lead Location: 753859
Implantable Lead Location: 753860
Implantable Lead Model: 7122
Implantable Pulse Generator Implant Date: 20231006
Lead Channel Impedance Value: 1175 Ohm
Lead Channel Impedance Value: 440 Ohm
Lead Channel Impedance Value: 490 Ohm
Lead Channel Pacing Threshold Amplitude: 0.75 V
Lead Channel Pacing Threshold Amplitude: 1.25 V
Lead Channel Pacing Threshold Amplitude: 2 V
Lead Channel Pacing Threshold Pulse Width: 0.5 ms
Lead Channel Pacing Threshold Pulse Width: 0.6 ms
Lead Channel Pacing Threshold Pulse Width: 1.5 ms
Lead Channel Sensing Intrinsic Amplitude: 12 mV
Lead Channel Sensing Intrinsic Amplitude: 3 mV
Lead Channel Setting Pacing Amplitude: 2 V
Lead Channel Setting Pacing Amplitude: 2.5 V
Lead Channel Setting Pacing Amplitude: 2.5 V
Lead Channel Setting Pacing Pulse Width: 0.6 ms
Lead Channel Setting Pacing Pulse Width: 1.5 ms
Lead Channel Setting Sensing Sensitivity: 0.5 mV
Pulse Gen Serial Number: 8927074

## 2024-01-07 ENCOUNTER — Other Ambulatory Visit: Payer: Self-pay | Admitting: Internal Medicine

## 2024-01-18 ENCOUNTER — Encounter: Payer: Self-pay | Admitting: Internal Medicine

## 2024-01-30 NOTE — Progress Notes (Signed)
 Remote ICD transmission.

## 2024-02-01 ENCOUNTER — Telehealth (HOSPITAL_COMMUNITY): Payer: Self-pay | Admitting: Cardiology

## 2024-03-01 ENCOUNTER — Telehealth (HOSPITAL_COMMUNITY): Payer: Self-pay | Admitting: Cardiology

## 2024-03-09 ENCOUNTER — Ambulatory Visit
Admission: EM | Admit: 2024-03-09 | Discharge: 2024-03-09 | Disposition: A | Discharge status: Home / Self Care | Attending: Family Medicine | Admitting: Family Medicine

## 2024-03-09 ENCOUNTER — Other Ambulatory Visit: Payer: Self-pay

## 2024-03-09 ENCOUNTER — Ambulatory Visit: Admitting: Radiology

## 2024-03-09 DIAGNOSIS — S6992XA Unspecified injury of left wrist, hand and finger(s), initial encounter: Secondary | ICD-10-CM

## 2024-03-09 DIAGNOSIS — S61215A Laceration without foreign body of left ring finger without damage to nail, initial encounter: Secondary | ICD-10-CM | POA: Diagnosis not present

## 2024-03-09 DIAGNOSIS — S62635B Displaced fracture of distal phalanx of left ring finger, initial encounter for open fracture: Secondary | ICD-10-CM

## 2024-03-09 DIAGNOSIS — S61219A Laceration without foreign body of unspecified finger without damage to nail, initial encounter: Secondary | ICD-10-CM

## 2024-03-09 DIAGNOSIS — S61412A Laceration without foreign body of left hand, initial encounter: Secondary | ICD-10-CM

## 2024-03-09 DIAGNOSIS — M1812 Unilateral primary osteoarthritis of first carpometacarpal joint, left hand: Secondary | ICD-10-CM | POA: Diagnosis not present

## 2024-03-09 DIAGNOSIS — M19042 Primary osteoarthritis, left hand: Secondary | ICD-10-CM | POA: Diagnosis not present

## 2024-03-09 DIAGNOSIS — S62635A Displaced fracture of distal phalanx of left ring finger, initial encounter for closed fracture: Secondary | ICD-10-CM | POA: Diagnosis not present

## 2024-03-09 MED ORDER — HYDROCODONE-ACETAMINOPHEN 5-325 MG PO TABS: 1.0000 | ORAL_TABLET | Freq: Four times a day (QID) | ORAL | 0 refills | Status: AC | PRN

## 2024-03-09 MED ORDER — CEPHALEXIN 500 MG PO CAPS: 500.0000 mg | ORAL_CAPSULE | Freq: Four times a day (QID) | ORAL | 0 refills | Status: AC

## 2024-03-09 NOTE — ED Provider Notes (Addendum)
 [facility]    CSN: [id number] Arrival date & time: 03/09/24  1408      History   Chief Complaint Chief Complaint  Patient presents with   Finger Injury    HPI Isaiah Wu is a 73 y.o. male.   The history is provided by the patient.   Dentally cut multiple fingers on left hand while using a saw at home just prior to arrival he is right-handed.  Last tetanus uncertain. Denies paresthesias, weakness, other injury, concern for foreign body.  Bleeding controlled with pressure.  Past Medical History:  Diagnosis Date   Actinic keratoses    Per Records from Dr.Kevin Little    Cardiomyopathy (HCC) 04/07/2015   Chronic systolic CHF (congestive heart failure) (HCC) 04/14/2015   a. s/p STJ CRTD b. EF normalized with CRT therapy   Colon polyp    Per Records from Dr.Kevin Little    Elevated PSA    Followed by Dr. 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   placed by Dr.McLean, Per Records from Dr.Kevin Little    LBBB (left bundle branch block)    Non-ischemic cardiomyopathy (HCC)    Per Records from Dr.Kevin Little    Shortness of breath    Per Records from Dr.Kevin Little    Syncope 05/30/2015    Patient Active Problem List   Diagnosis Date Noted   Seasonal allergies 09/14/2021   Class 1 obesity due to excess calories with serious comorbidity and body mass index (BMI) of 30.0 to 30.9 in adult 09/14/2021   Elevated PSA 05/12/2021   AICD (automatic cardioverter/defibrillator) present    NICM (nonischemic  cardiomyopathy) (HCC) 07/21/2015   Chronic systolic CHF (congestive heart failure) (HCC) 04/14/2015   LBBB (left bundle branch block)    Essential hypertension     Past Surgical History:  Procedure Laterality Date   BIV ICD GENERATOR CHANGEOUT N/A 09/24/2022   Procedure: BIV ICD GENERATOR CHANGEOUT;  Surgeon: Duke Salvia, MD;  Location: [facility] INVASIVE CV LAB;  Service: Cardiovascular;  Laterality: N/A;   CARDIAC CATHETERIZATION     CATARACT EXTRACTION Right 12/2022   CATARACT EXTRACTION  01/2023   COLONOSCOPY  08/30/2005   Per Records from Dr.Kevin Little    EP IMPLANTABLE DEVICE N/A 07/21/2015   STJ CRTD implanted by Dr Graciela Husbands for NICM, CHF   LEFT AND RIGHT HEART CATHETERIZATION WITH CORONARY ANGIOGRAM N/A 04/09/2015   Procedure: LEFT AND RIGHT HEART CATHETERIZATION WITH CORONARY ANGIOGRAM;  Surgeon: Othella Boyer, MD;  Location: [facility] CATH LAB;  Service: Cardiovascular;  Laterality: N/A;   ORIF ELBOW FRACTURE Left 1981   PACEMAKER GENERATOR CHANGE  11/2022   SHOULDER SURGERY Left 05/2008   "had to put cadavear bone in and reattach muscle to it"   TONSILLECTOMY AND ADENOIDECTOMY  1960's       Home Medications    Prior to Admission medications   Medication Sig Start Date End Date Taking? Authorizing Provider  carvedilol (COREG) 12.5 MG tablet Take 1 tablet (12.5 mg total) by mouth 2 (two) times daily with a meal. 12/15/23   Laurey Morale, MD  ENTRESTO 49-51 MG TAKE 1 TABLET BY MOUTH TWICE A DAY 12/16/23   Laurey Morale, MD  eplerenone (INSPRA) 50 MG tablet TAKE 1 TABLET BY MOUTH EVERY DAY 01/09/24   Duke Salvia, MD  furosemide (LASIX) 20 MG tablet TAKE 1 TABLET (20 MG TOTAL) BY MOUTH DAILY AS NEEDED. NEED FOLLOW UP APPOINTMENT FOR ANYMORE REFILLS 02/14/23   Laurey Morale, MD  loratadine (CLARITIN) 10 MG tablet Take 10 mg by mouth as needed for allergies.    [provider]  Multiple Vitamins-Minerals (CENTRUM SILVER 50+MEN) TABS Take 1 tablet by mouth daily.     [provider]    Family History Family History  Problem Relation Age of Onset   CVA Mother    High Cholesterol Mother        Per Records from Dr.Kevin Little    Cataracts Mother        Per Records from Dr.Kevin Little    High blood pressure Mother        Per Records from Dr.Kevin Little    Bone cancer Father    Diabetes Father    Colon polyps Father        Precamcerous, Per Records from Dr.Kevin Little    High blood pressure Sister    Colon cancer Neg Hx    Esophageal cancer Neg Hx    Rectal cancer Neg Hx    Stomach cancer Neg Hx     Social History Social History   Tobacco Use   Smoking status: Former    Current packs/day: 0.00    Average packs/day: 1 pack/day for 3.0 years (3.0 ttl pk-yrs)    Types: Cigarettes    Start date: 12/20/1970    Quit date: 12/20/1973    Years since quitting: 50.2   Smokeless tobacco: Never   Tobacco comments:    'quit smoking in the 1970's"  Vaping Use   Vaping status: Never Used  Substance Use Topics   Alcohol use: Yes    Alcohol/week: 4.0 standard drinks of alcohol    Types: 4 Glasses of wine per week   Drug use: No     Allergies   Patient has no known allergies.   Review of Systems Review of Systems   Physical Exam Triage Vital Signs ED Triage Vitals  Encounter Vitals Group     BP 03/09/24 1420 117/78     Systolic BP Percentile --      Diastolic BP Percentile --      Pulse Rate 03/09/24 1420 81     Resp 03/09/24 1420 18     Temp 03/09/24 1420 97.9 F (36.6 C)     Temp Source 03/09/24 1420 Oral     SpO2 03/09/24 1420 96 %     Weight 03/09/24 1419 230 lb (104.3 kg)     Height 03/09/24 1419 6\' 1"  (1.854 m)     Head Circumference --      Peak Flow --      Pain Score 03/09/24 1419 5     Pain Loc --      Pain Education --      Exclude from Growth Chart --    No data found.  Updated Vital Signs BP 117/78 (BP Location: Right Arm)   Pulse 81   Temp 97.9 F (36.6 C) (Oral)  Resp 18   Ht 6\' 1"  (1.854  m)   Wt 230 lb (104.3 kg)   SpO2 96%   BMI 30.34 kg/m   Visual Acuity Right Eye Distance:   Left Eye Distance:   Bilateral Distance:    Right Eye Near:   Left Eye Near:    Bilateral Near:     Physical Exam Vitals reviewed.  Constitutional:      Appearance: He is not ill-appearing.  HENT:     Head: Normocephalic and atraumatic.  Pulmonary:     Effort: Pulmonary effort is normal. No respiratory distress.  Musculoskeletal:     Comments: Left hand moves all digits no deformity  Skin:    Comments: Left finger and 6 finger has several jagged superficial wounds and 2 full-thickness lacerations each less than 0.5 cm, digit with full range of motion, left middle finger superficial jagged lacerations volar surface digit with full range of motion, left fourth finger full-thickness irregular shaped 2 cm laceration does not involve the nailbed, active bleeding, full range of motion, no foreign body seen or palpated, left fifth finger superficial laceration distal tip no active bleeding finger with full  Neurological:     Mental Status: He is alert.     UC Treatments / Results  Labs (all labs ordered are listed, but only abnormal results are displayed) Labs Reviewed - No data to display  EKG   Radiology No results found.  Procedures Laceration Repair  Date/Time: 03/09/2024 4:12 PM  Performed by: Meliton Rattan, PA Authorized by: Meliton Rattan, PA   Consent:    Consent obtained:  Verbal   Risks discussed:  Infection and pain Universal protocol:    Procedure explained and questions answered to patient or proxy's satisfaction: yes     Patient identity confirmed:  Verbally with patient Anesthesia:    Anesthesia method:  Local infiltration   Local anesthetic:  Lidocaine 1% w/o epi Laceration details:    Location:  Finger   Finger location:  R ring finger Pre-procedure details:    Preparation:  Patient was prepped and draped in usual sterile fashion and imaging obtained  to evaluate for foreign bodies Exploration:    Imaging obtained: x-ray     Imaging outcome: foreign body not noted   Treatment:    Area cleansed with:  Chlorhexidine   Irrigation solution:  Tap water   Debridement:  Minimal   Undermining:  None   Scar revision: yes     Layers/structures repaired:  Deep subcutaneous Skin repair:    Repair method:  Sutures   Suture size:  5-0   Suture material:  Nylon   Suture technique:  Simple interrupted   Number of sutures:  10 Approximation:    Approximation:  Close Repair type:    Repair type:  Simple Post-procedure details:    Dressing:  Sterile dressing   Procedure completion:  Tolerated well, no immediate complications  (including critical care time)  Medications Ordered in UC Medications - No data to display  Initial Impression / Assessment and Plan / UC Course  I have reviewed the triage vital signs and the nursing notes.  Pertinent labs & imaging results that were available during my care of the patient were reviewed by me and considered in my medical decision making (see chart for details).     Left hand x-ray independently viewed by me fracture fourth finger distal tuft  Wounds were prepped and draped, wounds were sutured, cleaned and dressed.  Home care and  follow-up reviewed with patient Patient counseled to follow-up with hand specialist on Monday should call today to schedule the appointment.  Chart was reviewed his last tetanus was in 2022 therefore no update required at this time.  Rx antibiotics and pain medicine sent sent to his pharmacy Final Clinical Impressions(s) / UC Diagnoses   Final diagnoses:  Injury, hand, left, initial encounter   Discharge Instructions   None    ED Prescriptions   None    PDMP not reviewed this encounter.   Meliton Rattan, PA 03/09/24 1615    Meliton Rattan, [place] 03/09/24 1616

## 2024-03-09 NOTE — Discharge Instructions (Signed)
 Follow-up with a hand specialist Monday To the ED for new or worsening symptoms or concerns Suture removal 7-10 days

## 2024-03-09 NOTE — ED Triage Notes (Addendum)
 Pt presents with complaints of finger injuries on left hand after slicing three fingers on accident with hand circular saw approximately 30 minutes PTA. Pt currently rates his overall pain a 5/10. Bleeding is controlled. Pt currently has left hand wrapped in washcloth on arrival. Unsure if tetanus is up-to-date.

## 2024-03-19 DIAGNOSIS — S65515A Laceration of blood vessel of left ring finger, initial encounter: Secondary | ICD-10-CM | POA: Diagnosis not present

## 2024-03-19 DIAGNOSIS — S62635B Displaced fracture of distal phalanx of left ring finger, initial encounter for open fracture: Secondary | ICD-10-CM | POA: Diagnosis not present

## 2024-03-23 LAB — CUP PACEART REMOTE DEVICE CHECK
Battery Remaining Longevity: 53 mo
Battery Remaining Percentage: 73 %
Battery Voltage: 2.98 V
Brady Statistic AP VP Percent: 16 %
Brady Statistic AP VS Percent: 1 %
Brady Statistic AS VP Percent: 84 %
Brady Statistic AS VS Percent: 1 %
Brady Statistic RA Percent Paced: 16 %
Date Time Interrogation Session: 20250404072203
HighPow Impedance: 77 Ohm
HighPow Impedance: 77 Ohm
Implantable Lead Connection Status: 753985
Implantable Lead Connection Status: 753985
Implantable Lead Connection Status: 753985
Implantable Lead Implant Date: 20160801
Implantable Lead Implant Date: 20160801
Implantable Lead Implant Date: 20160801
Implantable Lead Location: 753858
Implantable Lead Location: 753859
Implantable Lead Location: 753860
Implantable Lead Model: 7122
Implantable Pulse Generator Implant Date: 20231006
Lead Channel Impedance Value: 1125 Ohm
Lead Channel Impedance Value: 440 Ohm
Lead Channel Impedance Value: 450 Ohm
Lead Channel Pacing Threshold Amplitude: 0.75 V
Lead Channel Pacing Threshold Amplitude: 1.25 V
Lead Channel Pacing Threshold Amplitude: 2 V
Lead Channel Pacing Threshold Pulse Width: 0.5 ms
Lead Channel Pacing Threshold Pulse Width: 0.6 ms
Lead Channel Pacing Threshold Pulse Width: 1.5 ms
Lead Channel Sensing Intrinsic Amplitude: 12 mV
Lead Channel Sensing Intrinsic Amplitude: 3.1 mV
Lead Channel Setting Pacing Amplitude: 2 V
Lead Channel Setting Pacing Amplitude: 2.5 V
Lead Channel Setting Pacing Amplitude: 2.5 V
Lead Channel Setting Pacing Pulse Width: 0.6 ms
Lead Channel Setting Pacing Pulse Width: 1.5 ms
Lead Channel Setting Sensing Sensitivity: 0.5 mV
Pulse Gen Serial Number: 8927074

## 2024-03-26 ENCOUNTER — Ambulatory Visit (INDEPENDENT_AMBULATORY_CARE_PROVIDER_SITE_OTHER): Payer: Self-pay

## 2024-03-26 DIAGNOSIS — I428 Other cardiomyopathies: Secondary | ICD-10-CM | POA: Diagnosis not present

## 2024-03-26 DIAGNOSIS — I5022 Chronic systolic (congestive) heart failure: Secondary | ICD-10-CM

## 2024-03-26 DIAGNOSIS — S65515A Laceration of blood vessel of left ring finger, initial encounter: Secondary | ICD-10-CM | POA: Diagnosis not present

## 2024-03-31 ENCOUNTER — Encounter: Payer: Self-pay | Admitting: Internal Medicine

## 2024-04-02 ENCOUNTER — Other Ambulatory Visit (HOSPITAL_COMMUNITY): Payer: Self-pay | Admitting: Cardiology

## 2024-04-12 ENCOUNTER — Ambulatory Visit (HOSPITAL_COMMUNITY)
Admission: RE | Admit: 2024-04-12 | Discharge: 2024-04-12 | Disposition: A | Discharge status: Home / Self Care | Source: Ambulatory Visit | Attending: Cardiology | Admitting: Cardiology

## 2024-04-12 ENCOUNTER — Encounter (HOSPITAL_COMMUNITY): Payer: Self-pay | Admitting: Cardiology

## 2024-04-12 VITALS — BP 122/78 | HR 70 | Wt 240.2 lb

## 2024-04-12 DIAGNOSIS — Z87891 Personal history of nicotine dependence: Secondary | ICD-10-CM | POA: Diagnosis not present

## 2024-04-12 DIAGNOSIS — I110 Hypertensive heart disease with heart failure: Secondary | ICD-10-CM | POA: Insufficient documentation

## 2024-04-12 DIAGNOSIS — I5022 Chronic systolic (congestive) heart failure: Secondary | ICD-10-CM | POA: Diagnosis not present

## 2024-04-12 DIAGNOSIS — I447 Left bundle-branch block, unspecified: Secondary | ICD-10-CM | POA: Insufficient documentation

## 2024-04-12 DIAGNOSIS — Z79899 Other long term (current) drug therapy: Secondary | ICD-10-CM | POA: Insufficient documentation

## 2024-04-12 DIAGNOSIS — Z95810 Presence of automatic (implantable) cardiac defibrillator: Secondary | ICD-10-CM | POA: Diagnosis not present

## 2024-04-12 DIAGNOSIS — I428 Other cardiomyopathies: Secondary | ICD-10-CM | POA: Diagnosis not present

## 2024-04-12 LAB — BASIC METABOLIC PANEL WITH GFR
Anion gap: 10 (ref 5–15)
BUN: 16 mg/dL (ref 8–23)
CO2: 22 mmol/L (ref 22–32)
Calcium: 9.6 mg/dL (ref 8.9–10.3)
Chloride: 106 mmol/L (ref 98–111)
Creatinine, Ser: 1.02 mg/dL (ref 0.61–1.24)
GFR, Estimated: >60 mL/min (ref >60)
Glucose, Bld: 116 mg/dL — ABNORMAL HIGH (ref 70–99)
Potassium: 4.6 mmol/L (ref 3.5–5.1)
Sodium: 138 mmol/L (ref 135–145)

## 2024-04-12 LAB — BRAIN NATRIURETIC PEPTIDE: B Natriuretic Peptide: 19 pg/mL (ref 0.0–100.0)

## 2024-04-12 MED ORDER — ENTRESTO 49-51 MG PO TABS: 1.0000 | ORAL_TABLET | Freq: Two times a day (BID) | ORAL | 11 refills | Status: AC

## 2024-04-12 NOTE — Progress Notes (Signed)
 Patient ID: Isaiah Wu, male   DOB: 04-Jan-1951, 73 y.o.   MRN: [id number] PCP: Dr. Nathen Balder HF Cardiology: Dr. Mitzie Anda  Chief complaint: CHF  73 y.o. with minimal PMH diagnosed with systolic HF in 4/16.Echo showed EF 15% with diffuse hypokinesis.  Initially seen by Dr. Anastasia Balo. He was admitted to [facility] in 4/16 for diuresis.  RHC/LHC showed elevated filling pressures and no significant coronary disease.  Underwent St Jude BiV-ICD on 07/21/15.  Repeat echo in 2/17 showed EF up to 40-45%.  Echo in 10/18 showed EF up to 50-55%.  Echo in 10/19 with EF 45-50%.  Echo in 1/21 was stable with EF 45-50%.    Echo in 7/22 showed EF 50-55%, normal RV, normal IVC.  Echo in 2/24 showed EF 50-55%, normal RV.   He returns for followup of CHF/dyspnea.  He continues to work part time for [employer].  Weight down 1 lb.  He notes fatigues after working in the yard for about 30 minutes but otherwise has been feeling fine.  No exertional dyspnea or chest pain.  No lightheadedness or palpitations. He takes a Lasix  every 2-3 weeks, usually after eating [place] food.   ECG (personally reviewed): a-BiV pacing  Labs 4/16: TSH normal, SPEP negative, ferritin normal, HIV negative, K 4.6, creatinine 1.22, HCT 46.9 Labs 6/16: K 3.8, Creatinine 1.08 Labs 07/18/15: K 4.1, Creatinine 0.99 Labs 9/16: digoxin  0.3, K 4.3, creatinine 1.03 Labs 11/16: K 4.3, creatinine 1.09, digoxin  0.6, BNP 23 Labs 2/17: K 4.2, creatinine 1.13, BNP 15.9, digoxin  0.4 Labs 3/17: K 4.2, creatinine 1.09, LDL 65 Labs 6/17: digoxin  0.3 Labs 7/17: K 4.3, creatinine 1.19 Labs 8/17: digoxin  0.5, K 3.9, creatinine 1.15 Labs 4/18: LDL 71 Labs 7/18: K 4, creatinine 1.15 Labs 10/18: K 4, creatinine 1.03, TSH normal, hgb 15.9 Labs 7/19: LDL 98 Labs 10/19: K 4.1, creatinine 1.13  Labs 1/20: creatinine 0.98 Labs 10/20: LDL 90 Labs 1/21: K 4.6, creatinine 1.12 Labs 9/21: LDL 86 Labs 10/21: K 4.1, creatinine 1.16 Labs 1/22: K 4.3,  creatinine 1.08 Labs 10/23: K 4.7, creatinine 0.87, LDL 82, TGs 157 Labs (10/24): LDL 78, K 4.6, creatinine 4.09  PMH: 1. HTN 2. GERD 3. LBBB 4. Cardiomyopathy: Nonischemic.  Echo (4/16) with EF 15%, severe diffuse hypokinesis Anastasia Balo).  RHC/LHC (4/16) with normal coronaries; mean RA 10, PA 58/35 mean 48, mean PCWP 32, no comment on CO.  HIV negative, TSH normal, ferritin normal, SPEP normal. S/p St Jude CRT-D 07/21/15.  - cMRI  04/2015 with severely dilated LV, EF 14%, septal-lateral dyssynchrony (prominent), normal RV size with mild to moderately decreased systolic function, at least moderate functional mitral regurgitation, there was basal inferoseptal subepicardial LGE (small area) and apical septal subtle mid-wall LGE (small area).   - Echo (9/16) with EF 25-30%, mild MR => improved.  - CPX (1/17) with VE/VCO2 26.2, peak VO2 20.3, RER 1.13 => mild heart failure limitation.   - Echo (2/17) EF 40-45%.   - Echo (4/18) with EF 50-55% - Echo (10/18): EF 50-55%, mild RV dilation with normal systolic function.  - Echo (10/19): EF 45-50%, RV mildly dilated, normal systolic function.  - Echo (1/21): EF 45-50%, low normal RV function.  - Echo (7/22): EF 50-55%, normal RV, normal IVC - Echo (2/24): EF 50-55%, normal RV.  5. Syncopal episode 6/16: Monitor ok. Suspect orthostasis.  6. Sleep study (12/22): Mild OSA.  7. Coronary artery calcium score (1/24): 9 Agatston units, 20th percentile.  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 Works for [employer] now.   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  No cardiomyopathy or sudden death.   ROS: All systems reviewed and negative except as per HPI.   Current Outpatient Medications  Medication Sig Dispense Refill   carvedilol  (COREG ) 12.5 MG tablet Take 1 tablet (12.5 mg total) by mouth 2 (two) times daily with a meal. 180 tablet 1   eplerenone  (INSPRA ) 50 MG tablet TAKE 1 TABLET BY MOUTH  EVERY DAY 90 tablet 3   furosemide  (LASIX ) 20 MG tablet TAKE 1 TABLET (20 MG TOTAL) BY MOUTH DAILY AS NEEDED. NEED FOLLOW UP APPOINTMENT FOR ANYMORE REFILLS 90 tablet 0   loratadine (CLARITIN) 10 MG tablet Take 10 mg by mouth as needed for allergies.     Multiple Vitamins-Minerals (CENTRUM SILVER 50+MEN) TABS Take 1 tablet by mouth daily.     sacubitril -valsartan  (ENTRESTO ) 49-51 MG Take 1 tablet by mouth 2 (two) times daily. 60 tablet 11   No current facility-administered medications for this encounter.   BP 122/78   Pulse 70   Wt 109 kg (240 lb 3.2 oz)   SpO2 97%   BMI 31.69 kg/m  General: NAD Neck: No JVD, no thyromegaly or thyroid  nodule.  Lungs: Clear to auscultation bilaterally with normal respiratory effort. CV: Nondisplaced PMI.  Heart regular S1/S2, no S3/S4, no murmur.  No peripheral edema.  No carotid bruit.  Normal pedal pulses.  Abdomen: Soft, nontender, no hepatosplenomegaly, no distention.  Skin: Intact without lesions or rashes.  Neurologic: Alert and oriented x 3.  Psych: Normal affect. Extremities: No clubbing or cyanosis.  HEENT: Normal.   Assessment/Plan:  1. Chronic systolic CHF: Nonischemic cardiomyopathy, EF 15% initially, up to 50-55% on 7/22 echo with medical treatment and St Jude CRT-D. Echo in 2/24 also with EF 50-55%.  Etiology of cardiomyopathy is uncertain: normal coronaries, no symptoms suggestive of viral syndrome prior to admission, HIV/Ferritin/SPEP/TSH unremarkable.  No history of familial CMP.  He has LBBB of uncertain duration.  ? LBBB cardiomyopathy.  However, there was also mid-wall LGE in the septum on cardiac MRI, suggesting possibility of myocarditis.  CPX in 1/17 suggested only a mild heart failure limitation.  NYHA class I-II, not volume overloaded on exam.  - Continue eplerenone  50 mg daily, Entresto  49/51 bid.  BMET today.  - Continue Coreg  12.5 mg bid.     - I will repeat echo.  - I would like to see him increase exercise.      2. LBBB: Of  uncertain duration.  3. Cardiac risk factor evaluation: Calcium score in 1/24 was low risk (20th percentile).   Followup in 1 year if echo stable.   I spent 31 minutes reviewing records, interviewing/examining patient, and managing orders.    Maryiah Olvey Corrinne Din 04/12/2024

## 2024-04-12 NOTE — Patient Instructions (Signed)
 There has been no changes to your medications.  Labs done today, your results will be available in MyChart, we will contact you for abnormal readings.  Your physician has requested that you have an echocardiogram. Echocardiography is a painless test that uses sound waves to create images of your heart. It provides your doctor with information about the size and shape of your heart and how well your heart's chambers and valves are working. This procedure takes approximately one hour. There are no restrictions for this procedure. Please do NOT wear cologne, perfume, aftershave, or lotions (deodorant is allowed). Please arrive 15 minutes prior to your appointment time.  Please note: We ask at that you not bring children with you during ultrasound (echo/ vascular) testing. Due to room size and safety concerns, children are not allowed in the ultrasound rooms during exams. Our front office staff cannot provide observation of children in our lobby area while testing is being conducted. An adult accompanying a patient to their appointment will only be allowed in the ultrasound room at the discretion of the ultrasound technician under special circumstances. We apologize for any inconvenience.  Your physician recommends that you schedule a follow-up appointment in: 1 yr ( April 2026) ** PLEASE CALL THE OFFICE IN Karle Ovens 2026 TO ARRANGE YOUR FOLLOW UP APPOINTMENT.**  If you have any questions or concerns before your next appointment please send us  a message through [place] or call our office at [phone].    TO LEAVE A MESSAGE FOR THE NURSE SELECT OPTION 2, PLEASE LEAVE A MESSAGE INCLUDING: YOUR NAME DATE OF BIRTH CALL BACK NUMBER REASON FOR CALL**this is important as we prioritize the call backs  YOU WILL RECEIVE A CALL BACK THE SAME DAY AS LONG AS YOU CALL BEFORE 4:00 PM  At the [facility], you and your health needs are our priority. As part of our continuing mission to provide you  with exceptional heart care, we have created designated Provider Care Teams. These Care Teams include your primary Cardiologist (physician) and Advanced Practice Providers (APPs- Physician Assistants and Nurse Practitioners) who all work together to provide you with the care you need, when you need it.   You may see any of the following providers on your designated Care Team at your next follow up: Dr Jules Oar Dr Peder Bourdon Dr. Alwin Baars Dr. Arta Lark Amy Marijane Shoulders, NP Ruddy Corral, [place] [facility] [place], [place] Dennise Fitz, NP [place] Lee, NP Shawnee Dellen, NP Luster Salters, PharmD Bevely Brush, PharmD   Please be sure to bring in all your medications bottles to every appointment.    Thank you for choosing [facility]

## 2024-04-16 ENCOUNTER — Ambulatory Visit (INDEPENDENT_AMBULATORY_CARE_PROVIDER_SITE_OTHER): Payer: Medicare Other | Admitting: Nurse Practitioner

## 2024-04-16 ENCOUNTER — Encounter: Payer: Self-pay | Admitting: Nurse Practitioner

## 2024-04-16 VITALS — BP 126/78 | HR 69 | Temp 97.2°F | Ht 73.0 in | Wt 241.8 lb

## 2024-04-16 DIAGNOSIS — I5022 Chronic systolic (congestive) heart failure: Secondary | ICD-10-CM | POA: Diagnosis not present

## 2024-04-16 DIAGNOSIS — J302 Other seasonal allergic rhinitis: Secondary | ICD-10-CM | POA: Diagnosis not present

## 2024-04-16 DIAGNOSIS — R972 Elevated prostate specific antigen [PSA]: Secondary | ICD-10-CM | POA: Diagnosis not present

## 2024-04-16 DIAGNOSIS — E6609 Other obesity due to excess calories: Secondary | ICD-10-CM

## 2024-04-16 DIAGNOSIS — R7309 Other abnormal glucose: Secondary | ICD-10-CM | POA: Diagnosis not present

## 2024-04-16 DIAGNOSIS — I10 Essential (primary) hypertension: Secondary | ICD-10-CM

## 2024-04-16 DIAGNOSIS — E66811 Obesity, class 1: Secondary | ICD-10-CM

## 2024-04-16 DIAGNOSIS — Z6830 Body mass index (BMI) 30.0-30.9, adult: Secondary | ICD-10-CM

## 2024-04-16 DIAGNOSIS — Z95810 Presence of automatic (implantable) cardiac defibrillator: Secondary | ICD-10-CM

## 2024-04-16 DIAGNOSIS — I428 Other cardiomyopathies: Secondary | ICD-10-CM

## 2024-04-16 NOTE — Progress Notes (Signed)
 Careteam: Patient Care Team: Verma Gobble, NP as PCP - General (Geriatric Medicine) Verona Goodwill, MD as PCP - Electrophysiology (Cardiology) Dorsey Gault, MD as Consulting Physician (Cardiology) Darlis Eisenmenger, MD as Consulting Physician (Cardiology) Verona Goodwill, MD as Consulting Physician (Cardiology)  PLACE OF SERVICE:  [facility]  Advanced Directive information    No Known Allergies  Chief Complaint  Patient presents with   Medical Management of Chronic Issues    Medical Management of Chronic Issues. 6 Month follow up. To discuss need for Covid.     HPI:  Discussed the use of AI scribe software for clinical note transcription with the patient, who gave verbal consent to proceed.  History of Present Illness   Isaiah Corfield "[place]" is a 73 year old male with heart failure who presents for follow-up.  Six weeks ago, he accidentally cut four fingers with a circular saw while working on a deck. He received eight stitches on one finger and two stitches on another. Initially seen by a hand specialist at [facility], he had his stitches removed three weeks ago and is expected to be released from care next Monday. He has altered sensation on one side of the affected fingers.  He is following up on his heart failure. No worsening shortness of breath but notes decreased stamina compared to last summer, requiring breaks during yard work. He checks his pulse and waits for it to normalize before resuming activity. He continues to take Coreg  twice daily, and his blood pressure and pulse are stable.  He experiences seasonal allergies and takes Claritin twice daily, which he plans to reduce to once daily as symptoms typically resolve by mid-May. No side effects from the medication.  Recent labs showed elevated glucose and A1c levels, prompting a follow-up on his blood counts and A1c. He is not on any cholesterol medication, and his cholesterol levels are stable. No abnormal  joint pain, bowel movement issues, or nocturia. He sleeps with a thin pillow and does not require propping up.       Review of Systems:  Review of Systems  Constitutional:  Negative for chills, fever and weight loss.  HENT:  Negative for tinnitus.   Respiratory:  Negative for cough, sputum production and shortness of breath.   Cardiovascular:  Negative for chest pain, palpitations and leg swelling.  Gastrointestinal:  Negative for abdominal pain, constipation, diarrhea and heartburn.  Genitourinary:  Negative for dysuria, frequency and urgency.  Musculoskeletal:  Negative for back pain, falls, joint pain and myalgias.  Skin: Negative.   Neurological:  Negative for dizziness and headaches.  Psychiatric/Behavioral:  Negative for depression and memory loss. The patient does not have insomnia.     Past Medical History:  Diagnosis Date   Actinic keratoses    Per Records from Dr.Kevin Little    Cardiomyopathy (HCC) 04/07/2015   Chronic systolic CHF (congestive heart failure) (HCC) 04/14/2015   a. s/p STJ CRTD b. EF normalized with CRT therapy   Colon polyp    Per Records from Dr.Kevin Little    Elevated PSA    Followed by Dr. 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   placed by Dr.McLean, Per Records from Dr.Kevin Little    LBBB (left bundle branch block)    Non-ischemic cardiomyopathy (HCC)    Per Records from Dr.Kevin Little    Shortness of breath    Per Records from Dr.Kevin Little    Syncope 05/30/2015   Past Surgical History:   Procedure Laterality Date   BIV ICD GENERATOR CHANGEOUT N/A 09/24/2022   Procedure: BIV ICD GENERATOR CHANGEOUT;  Surgeon: Verona Goodwill, MD;  Location[facility] INVASIVE CV LAB;  Service: Cardiovascular;  Laterality: N/A;   CARDIAC CATHETERIZATION     CATARACT EXTRACTION Right 12/2022   CATARACT EXTRACTION  01/2023   COLONOSCOPY  08/30/2005   Per Records from Dr.Kevin Little    EP IMPLANTABLE DEVICE N/A 07/21/2015   STJ CRTD implanted by Dr Rodolfo Clan for NICM, CHF   LEFT AND RIGHT HEART CATHETERIZATION WITH CORONARY ANGIOGRAM N/A 04/09/2015   Procedure: LEFT AND RIGHT HEART CATHETERIZATION WITH CORONARY ANGIOGRAM;  Surgeon: Dorsey Gault, MD;  Location: [facility] CATH LAB;  Service: Cardiovascular;  Laterality: N/A;   ORIF ELBOW FRACTURE Left 1981   PACEMAKER GENERATOR CHANGE  11/2022   SHOULDER SURGERY Left 05/2008   "had to put cadavear bone in and reattach muscle to it"   TONSILLECTOMY AND ADENOIDECTOMY  1960's   Social History:   reports that he quit smoking about 50 years ago. His smoking use included cigarettes. He started smoking about 53 years ago. He has a 3 pack-year smoking history. He has never used smokeless tobacco. He reports current alcohol use of about 4.0 standard drinks of alcohol per week. He reports that he does not use drugs.  Family History  Problem Relation Age of Onset   CVA Mother    High Cholesterol Mother        Per Records from Dr.Kevin Little    Cataracts Mother        Per Records from Dr.Kevin Little    High blood pressure Mother        Per Records from Dr.Kevin Little    Bone cancer Father    Diabetes Father    Colon polyps Father        Precamcerous, Per Records from Dr.Kevin Little    High blood pressure Sister    Colon cancer Neg Hx    Esophageal cancer Neg Hx    Rectal cancer Neg Hx    Stomach cancer Neg Hx     Medications: Patient's Medications  New Prescriptions   No medications on file  Previous Medications   CARVEDILOL  (COREG ) 12.5 MG  TABLET    Take 1 tablet (12.5 mg total) by mouth 2 (two) times daily with a meal.   EPLERENONE  (INSPRA ) 50 MG TABLET    TAKE 1 TABLET BY MOUTH EVERY DAY   FUROSEMIDE  (LASIX ) 20 MG TABLET    TAKE 1 TABLET (20 MG TOTAL) BY MOUTH DAILY AS NEEDED. NEED FOLLOW UP APPOINTMENT FOR ANYMORE REFILLS   LORATADINE (CLARITIN) 10 MG TABLET    Take 10 mg by mouth as needed for allergies.   MULTIPLE VITAMINS-MINERALS (CENTRUM SILVER 50+MEN) TABS    Take 1 tablet by mouth daily.   SACUBITRIL -VALSARTAN  (ENTRESTO ) 49-51 MG    Take 1 tablet by mouth 2 (two) times daily.  Modified  Medications   No medications on file  Discontinued Medications   No medications on file    Physical Exam:  Vitals:   04/16/24 0815  BP: 126/78  Pulse: 69  Temp: (!) 97.2 F (36.2 C)  SpO2: 99%  Weight: 241 lb 12.8 oz (109.7 kg)  Height: 6\' 1"  (1.854 m)   Body mass index is 31.9 kg/m. Wt Readings from Last 3 Encounters:  04/16/24 241 lb 12.8 oz (109.7 kg)  04/12/24 240 lb 3.2 oz (109 kg)  03/09/24 230 lb (104.3 kg)    Physical Exam Constitutional:      General: He is not in acute distress.    Appearance: He is well-developed. He is not diaphoretic.  HENT:     Head: Normocephalic and atraumatic.     Right Ear: External ear normal.     Left Ear: External ear normal.     Mouth/Throat:     Pharynx: No oropharyngeal exudate.  Eyes:     Conjunctiva/sclera: Conjunctivae normal.     Pupils: Pupils are equal, round, and reactive to light.  Cardiovascular:     Rate and Rhythm: Normal rate and regular rhythm.     Heart sounds: Normal heart sounds.  Pulmonary:     Effort: Pulmonary effort is normal.     Breath sounds: Normal breath sounds.  Abdominal:     General: Bowel sounds are normal.     Palpations: Abdomen is soft.  Musculoskeletal:        General: No tenderness.     Cervical back: Normal range of motion and neck supple.     Right lower leg: No edema.     Left lower leg: No edema.  Skin:    General: Skin is  warm and dry.  Neurological:     Mental Status: He is alert and oriented to person, place, and time.     Labs reviewed: Basic Metabolic Panel: Recent Labs    10/17/23 0854 04/12/24 0849  NA 138 138  K 4.6 4.6  CL 105 106  CO2 25 22  GLUCOSE 107* 116*  BUN 14 16  CREATININE 1.07 1.02  CALCIUM 9.8 9.6   Liver Function Tests: Recent Labs    10/17/23 0854  AST 18  ALT 27  BILITOT 0.5  PROT 7.1   No results for input(s): "LIPASE", "AMYLASE" in the last 8760 hours. No results for input(s): "AMMONIA" in the last 8760 hours. CBC: Recent Labs    10/17/23 0854  WBC 13.2*  NEUTROABS 3,920  HGB 16.2  HCT 48.7  MCV 91.7  PLT 288   Lipid Panel: Recent Labs    10/17/23 0854  CHOL 147  HDL 51  LDLCALC 78  TRIG 98  CHOLHDL 2.9   TSH: No results for input(s): "TSH" in the last 8760 hours. A1C: Lab Results  Component Value Date   HGBA1C 6.3 (H) 10/17/2023     Assessment/Plan  Essential hypertension Assessment & Plan: Blood pressure well controlled, goal bp <140/90 Continue current medications and dietary modifications follow metabolic panel  Orders: -     CBC with Differential/Platelet  Chronic systolic CHF (congestive heart failure) (HCC) Assessment & Plan: Stable, without worsening of shortness of breath, chest pains, LE edema. Continues on coreg , entresto , eplerenone  and lasix  PRN   Class 1 obesity due to excess calories with serious comorbidity and body mass index (BMI) of 30.0 to 30.9 in adult Assessment & Plan: -education provided on healthy weight loss through increase in physical activity and  proper nutrition    Seasonal allergies Assessment & Plan: Stable, continues on loratadine daily   Elevated glucose -     Hemoglobin A1c  Elevated PSA Assessment & Plan: Hx of, no symptoms at this time  Orders: -     PSA  AICD (automatic cardioverter/defibrillator) present Assessment & Plan: Followed by cardiology   NICM (nonischemic  cardiomyopathy) (HCC) Assessment & Plan: Symptoms stable, followed by cardiology      Return in about 6 months (around 10/16/2024) for routine follow up, labs at time of visit.  Adrijana Haros K. Denney Fisherman [facility] [phone]

## 2024-04-16 NOTE — Assessment & Plan Note (Signed)
-  education provided on healthy weight loss through increase in physical activity and proper nutrition

## 2024-04-16 NOTE — Assessment & Plan Note (Signed)
 Blood pressure well controlled, goal bp <140/90 Continue current medications and dietary modifications follow metabolic panel

## 2024-04-16 NOTE — Assessment & Plan Note (Signed)
 Followed by cardiology

## 2024-04-16 NOTE — Assessment & Plan Note (Signed)
 Symptoms stable, followed by cardiology

## 2024-04-16 NOTE — Assessment & Plan Note (Signed)
 Stable, without worsening of shortness of breath, chest pains, LE edema. Continues on coreg , entresto , eplerenone  and lasix  PRN

## 2024-04-16 NOTE — Assessment & Plan Note (Signed)
 Stable, continues on loratadine daily

## 2024-04-16 NOTE — Assessment & Plan Note (Signed)
 Hx of, no symptoms at this time

## 2024-04-17 ENCOUNTER — Encounter: Payer: Self-pay | Admitting: Nurse Practitioner

## 2024-04-17 LAB — CBC WITH DIFFERENTIAL/PLATELET
Absolute Lymphocytes: 9654 {cells}/uL — ABNORMAL HIGH (ref 850–3900)
Absolute Monocytes: 857 {cells}/uL (ref 200–950)
Basophils Absolute: 61 {cells}/uL (ref 0–200)
Basophils Relative: 0.4 %
Eosinophils Absolute: 352 {cells}/uL (ref 15–500)
Eosinophils Relative: 2.3 %
HCT: 45.6 % (ref 38.5–50.0)
Hemoglobin: 15.5 g/dL (ref 13.2–17.1)
MCH: 30.5 pg (ref 27.0–33.0)
MCHC: 34 g/dL (ref 32.0–36.0)
MCV: 89.6 fL (ref 80.0–100.0)
MPV: 10.6 fL (ref 7.5–12.5)
Monocytes Relative: 5.6 %
Neutro Abs: 4376 {cells}/uL (ref 1500–7800)
Neutrophils Relative %: 28.6 %
Platelets: 247 10*3/uL (ref 140–400)
RBC: 5.09 10*6/uL (ref 4.20–5.80)
RDW: 12.4 % (ref 11.0–15.0)
Total Lymphocyte: 63.1 %
WBC: 15.3 10*3/uL — ABNORMAL HIGH (ref 3.8–10.8)

## 2024-04-17 LAB — HEMOGLOBIN A1C
Hgb A1c MFr Bld: 6.1 % — ABNORMAL HIGH (ref <5.7)
Mean Plasma Glucose: 128 mg/dL
eAG (mmol/L): 7.1 mmol/L

## 2024-04-17 LAB — PSA: PSA: 6.05 ng/mL — ABNORMAL HIGH (ref <=4.00)

## 2024-04-26 DIAGNOSIS — S62635B Displaced fracture of distal phalanx of left ring finger, initial encounter for open fracture: Secondary | ICD-10-CM | POA: Diagnosis not present

## 2024-04-26 DIAGNOSIS — S62635D Displaced fracture of distal phalanx of left ring finger, subsequent encounter for fracture with routine healing: Secondary | ICD-10-CM | POA: Diagnosis not present

## 2024-04-30 DIAGNOSIS — L570 Actinic keratosis: Secondary | ICD-10-CM | POA: Diagnosis not present

## 2024-04-30 DIAGNOSIS — X32XXXD Exposure to sunlight, subsequent encounter: Secondary | ICD-10-CM | POA: Diagnosis not present

## 2024-05-08 NOTE — Progress Notes (Signed)
 Remote ICD transmission.

## 2024-05-29 ENCOUNTER — Ambulatory Visit (HOSPITAL_COMMUNITY): Payer: Self-pay | Admitting: Cardiology

## 2024-05-29 ENCOUNTER — Ambulatory Visit (HOSPITAL_COMMUNITY)
Admission: RE | Admit: 2024-05-29 | Discharge: 2024-05-29 | Disposition: A | Discharge status: Home / Self Care | Source: Ambulatory Visit | Attending: Nurse Practitioner | Admitting: Nurse Practitioner

## 2024-05-29 DIAGNOSIS — Z006 Encounter for examination for normal comparison and control in clinical research program: Secondary | ICD-10-CM

## 2024-05-29 DIAGNOSIS — I5022 Chronic systolic (congestive) heart failure: Secondary | ICD-10-CM | POA: Insufficient documentation

## 2024-05-29 DIAGNOSIS — I110 Hypertensive heart disease with heart failure: Secondary | ICD-10-CM | POA: Insufficient documentation

## 2024-05-29 DIAGNOSIS — I447 Left bundle-branch block, unspecified: Secondary | ICD-10-CM | POA: Insufficient documentation

## 2024-05-29 LAB — ECHOCARDIOGRAM COMPLETE
Area-P 1/2: 2.91 cm2
Calc EF: 35.6 %
S' Lateral: 4 cm
Single Plane A2C EF: 33.6 %
Single Plane A4C EF: 37.3 %

## 2024-05-29 NOTE — Progress Notes (Signed)
  Echocardiogram 2D Echocardiogram has been performed.  Isaiah Wu 05/29/2024, 8:54 AM

## 2024-05-29 NOTE — Research (Signed)
 SITE: [id number]     Subject # [id number]   Subprotocol: A  Inclusion Criteria  Patients who meet all of the following criteria are eligible for enrollment as study participants:  Yes No  Age > 71 years old X   Eligible to wear Holter Study X    Exclusion Criteria  Patients who meet any of these criteria are not eligible for enrollment as study participants: Yes No  1. Receiving any mechanical (respiratory or circulatory) or renal support therapy at Screening or during Visit #1.  X  2.  Any other conditions that in the opinion of the investigators are likely to prevent compliance with the study protocol or pose a safety concern if the subject participates in the study.  X  3. Poor tolerance, namely susceptible to severe skin allergies from ECG adhesive patch application.  X   Protocol: REV H    60 minute start window         Cor device must be applied, and the study initiated, no later than 60 minutes of completing the Echocardiogram                             HH:MM  Echo completion time  08:41  2.   Cor Study start time  08:53   30-Minute execution window  Once Cor Monitoring begins, 3 QT Med ECGs and the 15-minute rest period must be completed within a 30 minute window     HH:MM  3. QT Med ECG Completion time  08:56  4. Start of 15-Min sitting rest period  08:57  5. End of 15-Min rest period  09:12  6. Time of device removal  09:23   *Continue to use the Mobile App Event feature to log the Rest period windows and follow instructions on the EF-ACT Clinical Trial  Patient Instruction Card.  Describe any anomalies in Protocol execution in the Protocol Deviation Log    Residential Zip code [phone] (First 3 digits ONLY)                                           [facility] Informed Consent   Subject Name: Isaiah Wu  Subject met inclusion and exclusion criteria.  The informed consent form, study requirements and expectations were reviewed with the subject. Subject had opportunity  to read consent and questions and concerns were addressed prior to the signing of the consent form.  The subject verbalized understanding of the trial requirements.  The subject agreed to participate in the [facility] EF ACT trial and signed the informed consent at 08:48 on 29-May-2024.  The informed consent was obtained prior to performance of any protocol-specific procedures for the subject.  A copy of the signed informed consent was given to the subject and a copy was placed in the subject's medical record.   Isaiah Wu          Current Outpatient Medications:    carvedilol  (COREG ) 12.5 MG tablet, Take 1 tablet (12.5 mg total) by mouth 2 (two) times daily with a meal., Disp: 180 tablet, Rfl: 1   eplerenone  (INSPRA ) 50 MG tablet, TAKE 1 TABLET BY MOUTH EVERY DAY, Disp: 90 tablet, Rfl: 3   furosemide  (LASIX ) 20 MG tablet, TAKE 1 TABLET (20 MG TOTAL) BY MOUTH DAILY AS NEEDED. NEED FOLLOW UP APPOINTMENT FOR ANYMORE REFILLS,  Disp: 90 tablet, Rfl: 0   loratadine (CLARITIN) 10 MG tablet, Take 10 mg by mouth as needed for allergies., Disp: , Rfl:    Multiple Vitamins-Minerals (CENTRUM SILVER 50+MEN) TABS, Take 1 tablet by mouth daily., Disp: , Rfl:    sacubitril -valsartan  (ENTRESTO ) 49-51 MG, Take 1 tablet by mouth 2 (two) times daily., Disp: 60 tablet, Rfl: 11

## 2024-06-01 ENCOUNTER — Other Ambulatory Visit (HOSPITAL_COMMUNITY): Payer: Self-pay | Admitting: Cardiology

## 2024-06-14 DIAGNOSIS — H524 Presbyopia: Secondary | ICD-10-CM | POA: Diagnosis not present

## 2024-06-14 DIAGNOSIS — H5201 Hypermetropia, right eye: Secondary | ICD-10-CM | POA: Diagnosis not present

## 2024-06-14 DIAGNOSIS — H52223 Regular astigmatism, bilateral: Secondary | ICD-10-CM | POA: Diagnosis not present

## 2024-06-14 DIAGNOSIS — H35033 Hypertensive retinopathy, bilateral: Secondary | ICD-10-CM | POA: Diagnosis not present

## 2024-06-25 ENCOUNTER — Ambulatory Visit: Payer: Self-pay

## 2024-06-25 DIAGNOSIS — I428 Other cardiomyopathies: Secondary | ICD-10-CM | POA: Diagnosis not present

## 2024-06-25 LAB — CUP PACEART REMOTE DEVICE CHECK
Battery Remaining Longevity: 49 mo
Battery Remaining Percentage: 70 %
Battery Voltage: 2.98 V
Brady Statistic AP VP Percent: 22 %
Brady Statistic AP VS Percent: 1 %
Brady Statistic AS VP Percent: 78 %
Brady Statistic AS VS Percent: 1 %
Brady Statistic RA Percent Paced: 21 %
Date Time Interrogation Session: 20250707020022
HighPow Impedance: 80 Ohm
HighPow Impedance: 80 Ohm
Implantable Lead Connection Status: 753985
Implantable Lead Connection Status: 753985
Implantable Lead Connection Status: 753985
Implantable Lead Implant Date: 20160801
Implantable Lead Implant Date: 20160801
Implantable Lead Implant Date: 20160801
Implantable Lead Location: 753858
Implantable Lead Location: 753859
Implantable Lead Location: 753860
Implantable Lead Model: 7122
Implantable Pulse Generator Implant Date: 20231006
Lead Channel Impedance Value: 1100 Ohm
Lead Channel Impedance Value: 400 Ohm
Lead Channel Impedance Value: 490 Ohm
Lead Channel Pacing Threshold Amplitude: 0.75 V
Lead Channel Pacing Threshold Amplitude: 1.25 V
Lead Channel Pacing Threshold Amplitude: 2 V
Lead Channel Pacing Threshold Pulse Width: 0.5 ms
Lead Channel Pacing Threshold Pulse Width: 0.6 ms
Lead Channel Pacing Threshold Pulse Width: 1.5 ms
Lead Channel Sensing Intrinsic Amplitude: 11.7 mV
Lead Channel Sensing Intrinsic Amplitude: 3.6 mV
Lead Channel Setting Pacing Amplitude: 2 V
Lead Channel Setting Pacing Amplitude: 2.5 V
Lead Channel Setting Pacing Amplitude: 2.5 V
Lead Channel Setting Pacing Pulse Width: 0.6 ms
Lead Channel Setting Pacing Pulse Width: 1.5 ms
Lead Channel Setting Sensing Sensitivity: 0.5 mV
Pulse Gen Serial Number: 8927074

## 2024-07-21 ENCOUNTER — Ambulatory Visit: Payer: Self-pay | Admitting: Cardiology

## 2024-08-21 ENCOUNTER — Encounter: Payer: Self-pay | Admitting: Nurse Practitioner

## 2024-08-21 DIAGNOSIS — H9190 Unspecified hearing loss, unspecified ear: Secondary | ICD-10-CM

## 2024-09-20 ENCOUNTER — Other Ambulatory Visit: Payer: Self-pay | Admitting: Internal Medicine

## 2024-09-24 ENCOUNTER — Ambulatory Visit: Payer: Self-pay

## 2024-09-24 DIAGNOSIS — I5022 Chronic systolic (congestive) heart failure: Secondary | ICD-10-CM | POA: Diagnosis not present

## 2024-09-26 LAB — CUP PACEART REMOTE DEVICE CHECK
Battery Remaining Longevity: 48 mo
Battery Remaining Percentage: 67 %
Battery Voltage: 2.96 V
Brady Statistic AP VP Percent: 23 %
Brady Statistic AP VS Percent: 1 %
Brady Statistic AS VP Percent: 77 %
Brady Statistic AS VS Percent: 1 %
Brady Statistic RA Percent Paced: 23 %
Date Time Interrogation Session: 20251006020017
HighPow Impedance: 80 Ohm
HighPow Impedance: 80 Ohm
Implantable Lead Connection Status: 753985
Implantable Lead Connection Status: 753985
Implantable Lead Connection Status: 753985
Implantable Lead Implant Date: 20160801
Implantable Lead Implant Date: 20160801
Implantable Lead Implant Date: 20160801
Implantable Lead Location: 753858
Implantable Lead Location: 753859
Implantable Lead Location: 753860
Implantable Lead Model: 7122
Implantable Pulse Generator Implant Date: 20231006
Lead Channel Impedance Value: 1225 Ohm
Lead Channel Impedance Value: 430 Ohm
Lead Channel Impedance Value: 490 Ohm
Lead Channel Pacing Threshold Amplitude: 0.75 V
Lead Channel Pacing Threshold Amplitude: 1.25 V
Lead Channel Pacing Threshold Amplitude: 2 V
Lead Channel Pacing Threshold Pulse Width: 0.5 ms
Lead Channel Pacing Threshold Pulse Width: 0.6 ms
Lead Channel Pacing Threshold Pulse Width: 1.5 ms
Lead Channel Sensing Intrinsic Amplitude: 11.7 mV
Lead Channel Sensing Intrinsic Amplitude: 4.5 mV
Lead Channel Setting Pacing Amplitude: 2 V
Lead Channel Setting Pacing Amplitude: 2.5 V
Lead Channel Setting Pacing Amplitude: 2.5 V
Lead Channel Setting Pacing Pulse Width: 0.6 ms
Lead Channel Setting Pacing Pulse Width: 1.5 ms
Lead Channel Setting Sensing Sensitivity: 0.5 mV
Pulse Gen Serial Number: 8927074

## 2024-09-26 NOTE — Progress Notes (Signed)
 Remote ICD Transmission

## 2024-09-28 ENCOUNTER — Ambulatory Visit: Payer: Self-pay | Admitting: Cardiology

## 2024-10-01 NOTE — Progress Notes (Signed)
 Remote ICD Transmission

## 2024-10-18 ENCOUNTER — Encounter: Payer: Self-pay | Admitting: Nurse Practitioner

## 2024-10-18 NOTE — Patient Instructions (Signed)
 Visit your local pharmacy if you prefer to receive additional covid boosters.

## 2024-10-19 ENCOUNTER — Ambulatory Visit: Admitting: Nurse Practitioner

## 2024-10-19 ENCOUNTER — Encounter: Payer: Self-pay | Admitting: Nurse Practitioner

## 2024-10-19 ENCOUNTER — Other Ambulatory Visit (INDEPENDENT_AMBULATORY_CARE_PROVIDER_SITE_OTHER): Payer: Self-pay

## 2024-10-19 VITALS — BP 138/90 | HR 70 | Temp 97.6°F | Ht 73.0 in | Wt 242.2 lb

## 2024-10-19 DIAGNOSIS — I5022 Chronic systolic (congestive) heart failure: Secondary | ICD-10-CM | POA: Diagnosis not present

## 2024-10-19 DIAGNOSIS — I10 Essential (primary) hypertension: Secondary | ICD-10-CM

## 2024-10-19 DIAGNOSIS — J302 Other seasonal allergic rhinitis: Secondary | ICD-10-CM

## 2024-10-19 DIAGNOSIS — B351 Tinea unguium: Secondary | ICD-10-CM

## 2024-10-19 DIAGNOSIS — Z23 Encounter for immunization: Secondary | ICD-10-CM | POA: Diagnosis not present

## 2024-10-19 DIAGNOSIS — R7309 Other abnormal glucose: Secondary | ICD-10-CM

## 2024-10-19 DIAGNOSIS — Z6830 Body mass index (BMI) 30.0-30.9, adult: Secondary | ICD-10-CM

## 2024-10-19 DIAGNOSIS — E66811 Obesity, class 1: Secondary | ICD-10-CM | POA: Diagnosis not present

## 2024-10-19 DIAGNOSIS — K219 Gastro-esophageal reflux disease without esophagitis: Secondary | ICD-10-CM

## 2024-10-19 DIAGNOSIS — R972 Elevated prostate specific antigen [PSA]: Secondary | ICD-10-CM | POA: Diagnosis not present

## 2024-10-19 DIAGNOSIS — E6609 Other obesity due to excess calories: Secondary | ICD-10-CM

## 2024-10-19 DIAGNOSIS — H905 Unspecified sensorineural hearing loss: Secondary | ICD-10-CM

## 2024-10-19 NOTE — Progress Notes (Signed)
 Careteam: Patient Care Team: Caro Harlene POUR, NP as PCP - General (Geriatric Medicine) Fernande Elspeth BROCKS, MD (Inactive) as PCP - Electrophysiology (Cardiology) Blanca Elsie RAMAN, MD as Consulting Physician (Cardiology) Rolan Ezra RAMAN, MD as Consulting Physician (Cardiology) Fernande Elspeth BROCKS, MD (Inactive) as Consulting Physician (Cardiology)  PLACE OF SERVICE:  [facility]  Advanced Directive information Does Patient Have a Medical Advance Directive?: No, Would patient like information on creating a medical advance directive?: Yes (MAU/Ambulatory/Procedural Areas - Information given)  No Known Allergies  Chief Complaint  Patient presents with   Medical Management of Chronic Issues    6 month follow up. Discussed need for flu vaccine (today) and covid booster (pharmacy)   Callus on knuckle   Nail Problem    Right big toe    HPI:  Discussed the use of AI scribe software for clinical note transcription with the patient, who gave verbal consent to proceed.  History of Present Illness Isaiah Wu is a 73 year old male with heart failure who presents for a six-month follow-up visit.  He has noticed a rough patch on his knuckle, describing it as 'something changed' and 'just rough.' He has been using regular hand cream.  He is concerned about his right big toe, suspecting a possible fungal infection as the nail has turned a 'funky color.' There is no pain, and it is primarily a cosmetic issue. He has not had athlete's foot recently and is unsure how the condition developed.  His blood pressure readings at home have been normal, with values such as 109/64, 120/80, and 114/80. He notes that his weight has been high due to recent travel but is now coming back down. No swelling in the legs, shortness of breath, or chest pains. He is currently taking a generic form of Entresto , has not used a diuretic for about a month to six weeks, and continues on carvedilol  (Coreg ) twice  a day. He also takes medication for hay fever, which he expects to stop in the next week or two, and has resumed heartburn medication recently, which seems to help with symptoms like throat clearing in the morning.  His PSA was checked six months ago. No changes in urinary frequency or flow and no issues with bladder emptying.  He has a history of high cholesterol but is not on medication as his levels have been well controlled. He mentions his daughter inquiring about high cholesterol, noting she travels frequently, which affects her diet.  He has an upcoming ENT appointment for hearing aids due to almost no hearing in his right ear. He also mentions a change in his pacemaker doctor due to retirement.  He reports normal bowel movements, with no changes in consistency or frequency, and states he sleeps well at night, except for waking up early occasionally.   Review of Systems:  Review of Systems  Constitutional:  Negative for chills, fever and weight loss.  HENT:  Negative for tinnitus.   Respiratory:  Negative for cough, sputum production and shortness of breath.   Cardiovascular:  Negative for chest pain, palpitations and leg swelling.  Gastrointestinal:  Negative for abdominal pain, constipation, diarrhea and heartburn.  Genitourinary:  Negative for dysuria, frequency and urgency.  Musculoskeletal:  Negative for back pain, falls, joint pain and myalgias.  Skin: Negative.   Neurological:  Negative for dizziness and headaches.  Psychiatric/Behavioral:  Negative for depression and memory loss. The patient does not have insomnia.     Past Medical  History:  Diagnosis Date   Actinic keratoses    Per Records from Dr.Kevin Little    Cardiomyopathy (HCC) 04/07/2015   Chronic systolic CHF (congestive heart failure) (HCC) 04/14/2015   a. s/p STJ CRTD b. EF normalized with CRT therapy   Colon polyp    Per Records from Dr.Kevin Little    Elevated PSA    Followed by Dr. 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   placed by Dr.McLean, Per Records from Dr.Kevin Little    LBBB (left bundle branch block)    Non-ischemic cardiomyopathy (HCC)    Per Records from Dr.Kevin Little    Shortness of breath    Per Records from Dr.Kevin Little    Syncope 05/30/2015   Past Surgical History:  Procedure Laterality Date   BIV ICD GENERATOR CHANGEOUT N/A 09/24/2022   Procedure: BIV ICD GENERATOR CHANGEOUT;  Surgeon: Fernande Elspeth BROCKS, MD;  Location: [facility] INVASIVE CV LAB;  Service: Cardiovascular;  Laterality: N/A;   CARDIAC CATHETERIZATION     CATARACT EXTRACTION Right 12/2022   CATARACT EXTRACTION  01/2023   COLONOSCOPY  08/30/2005   Per Records from Dr.Kevin Little    EP IMPLANTABLE DEVICE N/A 07/21/2015   STJ CRTD implanted by Dr Fernande for NICM, CHF   LEFT AND RIGHT HEART CATHETERIZATION WITH CORONARY ANGIOGRAM N/A 04/09/2015   Procedure: LEFT AND RIGHT HEART CATHETERIZATION WITH CORONARY ANGIOGRAM;  Surgeon: Elsie GORMAN Somerset, MD;  Location: [facility] CATH LAB;  Service: Cardiovascular;  Laterality: N/A;   ORIF ELBOW FRACTURE Left 1981   PACEMAKER GENERATOR CHANGE  11/2022   SHOULDER SURGERY Left 05/2008   had to put cadavear bone in and reattach muscle to it   TONSILLECTOMY AND ADENOIDECTOMY  1960's   Social History:   reports that he quit smoking about 50 years ago. His smoking use included cigarettes. He started smoking about 53 years ago. He has a 3 pack-year smoking history. He has never used smokeless tobacco. He reports current alcohol use  of about 4.0 standard drinks of alcohol per week. He reports that he does not use drugs.  Family History  Problem Relation Age of Onset   CVA Mother    High Cholesterol Mother        Per Records from Dr.Kevin Little    Cataracts Mother        Per Records from Dr.Kevin Little    High blood pressure Mother        Per Records from Dr.Kevin Little    Bone cancer Father    Diabetes Father    Colon polyps Father        Precamcerous, Per Records from Dr.Kevin Little    High blood pressure Sister    Colon cancer Neg Hx    Esophageal cancer Neg Hx    Rectal cancer Neg Hx    Stomach cancer Neg Hx     Medications: Patient's Medications  New Prescriptions   No medications  on file  Previous Medications   CARVEDILOL  (COREG ) 12.5 MG TABLET    TAKE 1 TABLET (12.5MG  TOTAL) BY MOUTH TWICE A DAY WITH MEALS   EPLERENONE  (INSPRA ) 50 MG TABLET    TAKE 1 TABLET BY MOUTH EVERY DAY   FAMOTIDINE (PEPCID) 20 MG TABLET    Take 20 mg by mouth daily.   FUROSEMIDE  (LASIX ) 20 MG TABLET    TAKE 1 TABLET (20 MG TOTAL) BY MOUTH DAILY AS NEEDED. NEED FOLLOW UP APPOINTMENT FOR ANYMORE REFILLS   LORATADINE (CLARITIN) 10 MG TABLET    Take 10 mg by mouth as needed for allergies.   MULTIPLE VITAMINS-MINERALS (CENTRUM SILVER 50+MEN) TABS    Take 1 tablet by mouth daily.   SACUBITRIL -VALSARTAN  (ENTRESTO ) 49-51 MG    Take 1 tablet by mouth 2 (two) times daily.  Modified Medications   No medications on file  Discontinued Medications   No medications on file    Physical Exam:  Vitals:   10/18/24 1219 10/19/24 0826  BP: (!) 136/90 (!) 138/90  Pulse: 70   Temp: 97.6 F (36.4 C)   TempSrc: Temporal   SpO2: 97%   Weight: 242 lb 3.2 oz (109.9 kg)   Height: 6' 1 (1.854 m)    Body mass index is 31.95 kg/m. Wt Readings from Last 3 Encounters:  10/18/24 242 lb 3.2 oz (109.9 kg)  04/16/24 241 lb 12.8 oz (109.7 kg)  04/12/24 240 lb 3.2 oz (109 kg)    Physical Exam Constitutional:      General: He is not in  acute distress.    Appearance: He is well-developed. He is not diaphoretic.  HENT:     Head: Normocephalic and atraumatic.     Right Ear: External ear normal.     Left Ear: External ear normal.     Mouth/Throat:     Pharynx: No oropharyngeal exudate.  Eyes:     Conjunctiva/sclera: Conjunctivae normal.     Pupils: Pupils are equal, round, and reactive to light.  Cardiovascular:     Rate and Rhythm: Normal rate and regular rhythm.     Heart sounds: Normal heart sounds.  Pulmonary:     Effort: Pulmonary effort is normal.     Breath sounds: Normal breath sounds.  Abdominal:     General: Bowel sounds are normal.     Palpations: Abdomen is soft.  Musculoskeletal:        General: No tenderness.     Cervical back: Normal range of motion and neck supple.     Right lower leg: No edema.     Left lower leg: No edema.  Skin:    General: Skin is warm and dry.  Neurological:     Mental Status: He is alert and oriented to person, place, and time.     Labs reviewed: Basic Metabolic Panel: Recent Labs    04/12/24 0849  NA 138  K 4.6  CL 106  CO2 22  GLUCOSE 116*  BUN 16  CREATININE 1.02  CALCIUM 9.6   Liver Function Tests: No results for input(s): AST, ALT, ALKPHOS, BILITOT, PROT, ALBUMIN in the last 8760 hours. No results for input(s): LIPASE, AMYLASE in the last 8760 hours. No results for input(s): AMMONIA in the last 8760 hours. CBC: Recent Labs    04/16/24 0844  WBC 15.3*  NEUTROABS 4,376  HGB 15.5  HCT 45.6  MCV 89.6  PLT 247   Lipid Panel: No results for input(s): CHOL, HDL, LDLCALC, TRIG, CHOLHDL, LDLDIRECT  in the last 8760 hours. TSH: No results for input(s): TSH in the last 8760 hours. A1C: Lab Results  Component Value Date   HGBA1C 6.1 (H) 04/16/2024     Assessment/Plan  Essential hypertension Assessment & Plan: Blood pressure controlled at home; office reading likely situational. - Continue current antihypertensive  regimen.  Orders: -     CBC with Differential/Platelet -     Comprehensive metabolic panel with GFR  Chronic systolic CHF (congestive heart failure) (HCC) Assessment & Plan: Well-managed on current medications. - Continue Entresto  and carvedilol  as prescribed.   Class 1 obesity due to excess calories with serious comorbidity and body mass index (BMI) of 30.0 to 30.9 in adult Assessment & Plan: -education provided on healthy weight loss through increase in physical activity and proper nutrition    Seasonal allergies Assessment & Plan: Stable, continues on loratadine daily   Elevated PSA Assessment & Plan: PSA with mild elevated and stable; no urinary symptoms. - Monitor PSA levels today. - Consider urology referral if PSA levels increase.  Orders: -     PSA  Elevated glucose Assessment & Plan: Continue dietary modifications  Orders: -     Comprehensive metabolic panel with GFR -     Hemoglobin A1c  Gastroesophageal reflux disease without esophagitis Assessment & Plan: Symptoms controlled on famotine    Immunization due -     Flu vaccine HIGH DOSE PF(Fluzone Trivalent)  Dermatophytosis of nail Assessment & Plan: Fungal infection, cosmetic concern only. - Keep toenails short and aired out. - Consider over-the-counter antifungal nail polish if desired.    Hearing loss, right ear Significant hearing loss; ENT evaluation scheduled. - Proceed with ENT appointment for hearing aid evaluation.   Return in about 6 months (around 04/18/2025) for routine follow up, labs at time of visit.:   Severa Jeremiah K. Caro BODILY [facility] [phone]

## 2024-10-20 ENCOUNTER — Ambulatory Visit: Payer: Self-pay | Admitting: Nurse Practitioner

## 2024-10-20 LAB — COMPREHENSIVE METABOLIC PANEL WITH GFR
AG Ratio: 2 (calc) (ref 1.0–2.5)
ALT: 34 U/L (ref 9–46)
AST: 20 U/L (ref 10–35)
Albumin: 4.9 g/dL (ref 3.6–5.1)
Alkaline phosphatase (APISO): 66 U/L (ref 35–144)
BUN: 16 mg/dL (ref 7–25)
CO2: 24 mmol/L (ref 20–32)
Calcium: 9.8 mg/dL (ref 8.6–10.3)
Chloride: 105 mmol/L (ref 98–110)
Creat: 1.08 mg/dL (ref 0.70–1.28)
Globulin: 2.5 g/dL (ref 1.9–3.7)
Glucose, Bld: 130 mg/dL — ABNORMAL HIGH (ref 65–99)
Potassium: 4.6 mmol/L (ref 3.5–5.3)
Sodium: 137 mmol/L (ref 135–146)
Total Bilirubin: 0.7 mg/dL (ref 0.2–1.2)
Total Protein: 7.4 g/dL (ref 6.1–8.1)
eGFR: 73 mL/min/1.73m2 (ref >=60)

## 2024-10-20 LAB — CBC WITH DIFFERENTIAL/PLATELET
Absolute Lymphocytes: 16778 {cells}/uL — ABNORMAL HIGH (ref 850–3900)
Absolute Monocytes: 1030 {cells}/uL — ABNORMAL HIGH (ref 200–950)
Basophils Absolute: 90 {cells}/uL (ref 0–200)
Basophils Relative: 0.4 %
Eosinophils Absolute: 246 {cells}/uL (ref 15–500)
Eosinophils Relative: 1.1 %
HCT: 49.4 % (ref 38.5–50.0)
Hemoglobin: 16.4 g/dL (ref 13.2–17.1)
MCH: 30.4 pg (ref 27.0–33.0)
MCHC: 33.2 g/dL (ref 32.0–36.0)
MCV: 91.5 fL (ref 80.0–100.0)
MPV: 10.6 fL (ref 7.5–12.5)
Monocytes Relative: 4.6 %
Neutro Abs: 4256 {cells}/uL (ref 1500–7800)
Neutrophils Relative %: 19 %
Platelets: 277 Thousand/uL (ref 140–400)
RBC: 5.4 Million/uL (ref 4.20–5.80)
RDW: 13.1 % (ref 11.0–15.0)
Total Lymphocyte: 74.9 %
WBC: 22.4 Thousand/uL — ABNORMAL HIGH (ref 3.8–10.8)

## 2024-10-20 LAB — HEMOGLOBIN A1C
Hgb A1c MFr Bld: 6.1 % — ABNORMAL HIGH (ref <5.7)
Mean Plasma Glucose: 128 mg/dL
eAG (mmol/L): 7.1 mmol/L

## 2024-10-20 LAB — PSA: PSA: 5.99 ng/mL — ABNORMAL HIGH (ref <=4.00)

## 2024-10-21 DIAGNOSIS — B351 Tinea unguium: Secondary | ICD-10-CM | POA: Insufficient documentation

## 2024-10-21 DIAGNOSIS — K219 Gastro-esophageal reflux disease without esophagitis: Secondary | ICD-10-CM | POA: Insufficient documentation

## 2024-10-21 DIAGNOSIS — R7309 Other abnormal glucose: Secondary | ICD-10-CM | POA: Insufficient documentation

## 2024-10-21 NOTE — Assessment & Plan Note (Signed)
 Continue dietary modifications

## 2024-10-21 NOTE — Assessment & Plan Note (Signed)
-  education provided on healthy weight loss through increase in physical activity and proper nutrition

## 2024-10-21 NOTE — Assessment & Plan Note (Signed)
 Blood pressure controlled at home; office reading likely situational. - Continue current antihypertensive regimen.

## 2024-10-21 NOTE — Assessment & Plan Note (Signed)
 Fungal infection, cosmetic concern only. - Keep toenails short and aired out. - Consider over-the-counter antifungal nail polish if desired.

## 2024-10-21 NOTE — Assessment & Plan Note (Signed)
 Well-managed on current medications. - Continue Entresto  and carvedilol  as prescribed.

## 2024-10-21 NOTE — Assessment & Plan Note (Signed)
 Stable, continues on loratadine daily

## 2024-10-21 NOTE — Assessment & Plan Note (Signed)
 Symptoms controlled on famotine

## 2024-10-21 NOTE — Assessment & Plan Note (Signed)
 PSA with mild elevated and stable; no urinary symptoms. - Monitor PSA levels today. - Consider urology referral if PSA levels increase.

## 2024-10-23 ENCOUNTER — Other Ambulatory Visit: Payer: Self-pay | Admitting: Nurse Practitioner

## 2024-10-23 ENCOUNTER — Ambulatory Visit (INDEPENDENT_AMBULATORY_CARE_PROVIDER_SITE_OTHER): Admitting: Audiology

## 2024-10-23 ENCOUNTER — Ambulatory Visit (INDEPENDENT_AMBULATORY_CARE_PROVIDER_SITE_OTHER)

## 2024-10-23 VITALS — BP 124/81 | HR 70 | Ht 73.0 in | Wt 234.0 lb

## 2024-10-23 DIAGNOSIS — H903 Sensorineural hearing loss, bilateral: Secondary | ICD-10-CM | POA: Diagnosis not present

## 2024-10-23 DIAGNOSIS — J342 Deviated nasal septum: Secondary | ICD-10-CM | POA: Diagnosis not present

## 2024-10-23 DIAGNOSIS — H90A21 Sensorineural hearing loss, unilateral, right ear, with restricted hearing on the contralateral side: Secondary | ICD-10-CM

## 2024-10-23 DIAGNOSIS — D72820 Lymphocytosis (symptomatic): Secondary | ICD-10-CM

## 2024-10-23 DIAGNOSIS — H90A22 Sensorineural hearing loss, unilateral, left ear, with restricted hearing on the contralateral side: Secondary | ICD-10-CM

## 2024-10-23 DIAGNOSIS — H918X3 Other specified hearing loss, bilateral: Secondary | ICD-10-CM

## 2024-10-23 NOTE — Progress Notes (Signed)
 Dear Dr. Caro, Here is my assessment for our mutual patient, Isaiah Wu. Thank you for allowing me the opportunity to care for your patient. Please do not hesitate to contact me should you have any other questions. Sincerely, Dr. Penne Croak  Otolaryngology Clinic Note Referring provider: Dr. Caro HPI:  Discussed the use of AI scribe software for clinical note transcription with the patient, who gave verbal consent to proceed.  History of Present Illness Isaiah Wu is a 73 year old male who presents with hearing loss.  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 - Consistent lack of hearing protection until late 1990s to early 2000s  Nasal and throat symptoms - No current nasal or throat complaints except for seasonal allergic rhinitis - Hay fever typically resolves by late November or early December; this year, symptoms resolved earlier than usual - No significant snoring issues  Cardiac history - Pacemaker implanted in 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 I personally reviewed and interpreted audiogram. Left ear with normal sloping to severe SNHL, right ear with mild sloping to profound SNHL in the HF. Right ear with 96% word discrim. Left with 100%.      PMH/Meds/All/SocHx/FamHx/ROS:   Past Medical History:  Diagnosis Date   Actinic keratoses    Per Records from Dr.Kevin Little    Cardiomyopathy (HCC) 04/07/2015    Chronic systolic CHF (congestive heart failure) (HCC) 04/14/2015   a. s/p STJ CRTD b. EF normalized with CRT therapy   Colon polyp    Per Records from Dr.Kevin Little    Elevated PSA    Followed by Dr. 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   placed by Dr.McLean, Per Records from Dr.Kevin Little    LBBB (left bundle branch block)    Non-ischemic cardiomyopathy (HCC)    Per Records from Dr.Kevin Little    Shortness of breath    Per Records from Dr.Kevin Little    Syncope 05/30/2015     Past Surgical History:  Procedure Laterality Date   BIV ICD GENERATOR CHANGEOUT N/A 09/24/2022   Procedure: BIV ICD GENERATOR CHANGEOUT;  Surgeon: Fernande Elspeth BROCKS, MD;  Location: [facility] INVASIVE CV LAB;  Service: Cardiovascular;  Laterality: N/A;   CARDIAC CATHETERIZATION     CATARACT EXTRACTION Right 12/2022   CATARACT EXTRACTION  01/2023   COLONOSCOPY  08/30/2005   Per Records from Dr.Kevin Little    EP IMPLANTABLE DEVICE N/A 07/21/2015   STJ CRTD implanted by Dr Fernande for NICM, CHF  LEFT AND RIGHT HEART CATHETERIZATION WITH CORONARY ANGIOGRAM N/A 04/09/2015   Procedure: LEFT AND RIGHT HEART CATHETERIZATION WITH CORONARY ANGIOGRAM;  Surgeon: Elsie GORMAN Somerset, MD;  Location: [facility] CATH LAB;  Service: Cardiovascular;  Laterality: N/A;   ORIF ELBOW FRACTURE Left 1981   PACEMAKER GENERATOR CHANGE  11/2022   SHOULDER SURGERY Left 05/2008   had to put cadavear bone in and reattach muscle to it   TONSILLECTOMY AND ADENOIDECTOMY  1960's    Family History   Problem Relation Age of Onset   CVA Mother    High Cholesterol Mother        Per Records from Dr.Kevin Little    Cataracts Mother        Per Records from Dr.Kevin Little    High blood pressure Mother        Per Records from Dr.Kevin Little    Bone cancer Father    Diabetes Father    Colon polyps Father        Precamcerous, Per Records from Dr.Kevin Little    High blood pressure Sister    Colon cancer Neg Hx    Esophageal cancer Neg Hx    Rectal cancer Neg Hx    Stomach cancer Neg Hx      Social Connections: Socially Integrated (10/15/2024)   Social Connection and Isolation Panel    Frequency of Communication with Friends and Family: More than three times a week    Frequency of Social Gatherings with Friends and Family: More than three times a week    Attends Religious Services: More than 4 times per year    Active Member of [employer] or Organizations: Yes    Attends Engineer, Structural: More than 4 times per year    Marital Status: Married      Current Outpatient Medications:    carvedilol  (COREG ) 12.5 MG tablet, TAKE 1 TABLET (12.5MG  TOTAL) BY MOUTH TWICE A DAY WITH MEALS, Disp: 180 tablet, Rfl: 3   eplerenone  (INSPRA ) 50 MG tablet, TAKE 1 TABLET BY MOUTH EVERY DAY, Disp: 90 tablet, Rfl: 3   famotidine (PEPCID) 20 MG tablet, Take 20 mg by mouth daily., Disp: , Rfl:    loratadine (CLARITIN) 10 MG tablet, Take 10 mg by mouth as needed for allergies., Disp: , Rfl:    Multiple Vitamins-Minerals (CENTRUM SILVER 50+MEN) TABS, Take 1 tablet by mouth daily., Disp: , Rfl:    sacubitril -valsartan  (ENTRESTO ) 49-51 MG, Take 1 tablet by mouth 2 (two) times daily., Disp: 60 tablet, Rfl: 11   furosemide  (LASIX ) 20 MG tablet, TAKE 1 TABLET (20 MG TOTAL) BY MOUTH DAILY AS NEEDED. NEED FOLLOW UP APPOINTMENT FOR ANYMORE REFILLS (Patient not taking: Reported on 10/23/2024), Disp: 90 tablet, Rfl: 0   Physical Exam:   BP 124/81 (BP Location: Left Arm, Patient Position: Sitting, Cuff Size:  Normal)   Pulse 70   Ht 6' 1 (1.854 m)   Wt 234 lb (106.1 kg)   SpO2 97%   BMI 30.87 kg/m   The patient was awake, alert, and appropriate. The external ears were inspected, and otoscopy was performed to evaluate the external auditory canals and tympanic membranes. The nasal cavity and septum were examined for mucosal changes, obstruction, or discharge. The oral cavity and oropharynx were inspected for mucosal lesions, infection, or tonsillar hypertrophy. The neck was palpated for lymphadenopathy, thyroid  abnormalities, or other masses. Cranial nerve function was grossly intact.  Pertinent Findings: Physical Exam HEENT: Nasal septum deviated to the right. Normal oropharynx, oral  cavity normal.   Seprately Identifiable Procedures:  I personally ordered, reviewed and interpreted the following with the patient today  Procedure: Bilateral ear microscopy using microscope (CPT [phone]) Pre-procedure diagnosis: hearing loss Post-procedure diagnosis: same Indication: see above; given patient's otologic complaints and history, for improved and comprehensive examination of external ear and tympanic membrane, bilateral otologic examination using microscope was performed. Prior to proceeding, verbal consent was obtained after discussion of R/B/A  Procedure: Patient was placed semi-recumbent. Both ear canals were examined using the microscope with findings below. Patient tolerated the procedure well.  Right ear:  No significant lesions pinna. EAC: no significant lesions. Canal is clear. Eczematoid changes. minimal TM: Intact   Left ear:  No significant lesions pinna. EAC: no significant lesions. Canal is clear. Eczematoid changes. minimal TM: Intact    Impression & Plans:  Isaiah Wu is a 73 y.o. male  1. Asymmetrical hearing loss   2. Sensorineural hearing loss (SNHL) of right ear with restricted hearing of left ear   3. Sensorineural hearing loss (SNHL) of left ear with restricted  hearing of right ear   4. DNS (deviated nasal septum)    - Findings and diagnoses discussed in detail with the patient. - Risks, benefits, and alternatives were reviewed. Through shared decision making, the patient elects to proceed with below. Assessment & Plan Bilateral hearing loss Gradual bilateral hearing loss. Less than 1% chance of vestibular schwannoma. Occupational noise exposure likely contributory. - Ordered MRI to rule out vestibular schwannoma. - Recommended hearing aids. - Advised hearing protection.  - Orders placed:  Orders Placed This Encounter  Procedures   MR BRAIN/IAC W WO CONTRAST   - Medications prescribed/continued/adjusted: No orders of the defined types were placed in this encounter.  - Education materials provided to the patient. - Follow up: Would like a phone call with results. Needs med clearance. Got hearing aid list. Patient instructed to return sooner or go to the ED if new/worsening symptoms develop.  Thank you for allowing me the opportunity to care for your patient. Please do not hesitate to contact me should you have any other questions.  Sincerely, Penne Croak, DO Otolaryngologist (ENT) [facility] Phone: [phone] Fax: [phone]  10/23/2024, 4:00 PM

## 2024-10-23 NOTE — Progress Notes (Signed)
  [address], [address] [place], [place] [address] [phone]  Audiological Evaluation    Name: Isaiah Wu     DOB:   [birth year]      MRN:   [id number]                                                                                     Service Date: 10/23/2024     Accompanied by: unaccompanied   Patient comes today after Dr. Anice, ENT sent a referral for a hearing evaluation due to concerns with hearing loss.   Symptoms Yes Details  Hearing loss  [x]  For a long time has perceived that his hearing in the right ear is worse than in the left  Tinnitus  []    Ear pain/ infections/pressure  []    Balance problems  []    Noise exposure history  [x]  occupational  Previous ear surgeries  []    Family history of hearing loss  []    Amplification  []    Other  []      Otoscopy: Right ear: Clear external ear canal and notable landmarks visualized on the tympanic membrane. Left ear:  Clear external ear canal and notable landmarks visualized on the tympanic membrane.  Tympanometry: Right ear: Normal external ear canal volume with normal middle ear pressure and tympanic membrane compliance (Type A). Findings are suggestive of normal middle ear function. Left ear: Normal external ear canal volume with normal middle ear pressure and tympanic membrane compliance (Type A). Findings are suggestive of normal middle ear function.   Hearing Evaluation The hearing test results were completed under headphones and re-checked with inserts and results are deemed to be of good reliability. Test technique:  conventional    Pure tone Audiometry: Right ear- Mild to profound sensorineural hearing loss from 125 Hz - 8000 Hz. Left ear-  Normal sloping to severe sensorineural hearing loss from 125 Hz - 8000 Hz.  Speech Audiometry: Right ear- Speech Reception Threshold (SRT) was obtained at 60 dBHL, with contralateral masking. Left ear-Speech Reception Threshold (SRT) was obtained at 30 dBHL.    Word Recognition Score Tested using NU-6 (recorded) Right ear: 96% was obtained at a presentation level of 95 dBHL with contralateral masking which is deemed as  excellent. Left ear: 100% was obtained at a presentation level of 80 dBHL with contralateral masking which is deemed as  excellent.   Impression: There is a significant difference in pure-tone thresholds between ears, worse in the right ear.   Recommendations: Follow up with ENT as scheduled for today. Return for a hearing evaluation if concerns with hearing changes arise or per MD recommendation. Use hearing protection when exposed to loud/damaging sounds.  Consider a communication needs assessment after medical clearance for hearing aids is obtained.   Isaiah Wu, AUD

## 2024-10-30 ENCOUNTER — Inpatient Hospital Stay

## 2024-10-30 ENCOUNTER — Inpatient Hospital Stay: Attending: Hematology and Oncology | Admitting: Hematology and Oncology

## 2024-10-30 ENCOUNTER — Telehealth (INDEPENDENT_AMBULATORY_CARE_PROVIDER_SITE_OTHER): Payer: Self-pay

## 2024-10-30 VITALS — BP 146/98 | HR 70 | Temp 97.1°F | Resp 17 | Ht 73.23 in | Wt 242.7 lb

## 2024-10-30 DIAGNOSIS — Z87891 Personal history of nicotine dependence: Secondary | ICD-10-CM | POA: Insufficient documentation

## 2024-10-30 DIAGNOSIS — D72820 Lymphocytosis (symptomatic): Secondary | ICD-10-CM | POA: Diagnosis not present

## 2024-10-30 LAB — CBC WITH DIFFERENTIAL/PLATELET
Abs Immature Granulocytes: 0.04 K/uL (ref 0.00–0.07)
Basophils Absolute: 0.1 K/uL (ref 0.0–0.1)
Basophils Relative: 1 %
Eosinophils Absolute: 0.3 K/uL (ref 0.0–0.5)
Eosinophils Relative: 2 %
HCT: 47.3 % (ref 39.0–52.0)
Hemoglobin: 16.4 g/dL (ref 13.0–17.0)
Immature Granulocytes: 0 %
Lymphocytes Relative: 74 %
Lymphs Abs: 16.8 K/uL — ABNORMAL HIGH (ref 0.7–4.0)
MCH: 30.5 pg (ref 26.0–34.0)
MCHC: 34.7 g/dL (ref 30.0–36.0)
MCV: 88.1 fL (ref 80.0–100.0)
Monocytes Absolute: 1.2 K/uL — ABNORMAL HIGH (ref 0.1–1.0)
Monocytes Relative: 5 %
Neutro Abs: 4.1 K/uL (ref 1.7–7.7)
Neutrophils Relative %: 18 %
Platelets: 246 K/uL (ref 150–400)
RBC: 5.37 MIL/uL (ref 4.22–5.81)
RDW: 13.6 % (ref 11.5–15.5)
Smear Review: NORMAL
WBC: 22.6 K/uL — ABNORMAL HIGH (ref 4.0–10.5)
nRBC: 0 % (ref 0.0–0.2)

## 2024-10-30 LAB — CMP (CANCER CENTER ONLY)
ALT: 28 U/L (ref 0–44)
AST: 18 U/L (ref 15–41)
Albumin: 4.5 g/dL (ref 3.5–5.0)
Alkaline Phosphatase: 63 U/L (ref 38–126)
Anion gap: 7 (ref 5–15)
BUN: 16 mg/dL (ref 8–23)
CO2: 23 mmol/L (ref 22–32)
Calcium: 9.5 mg/dL (ref 8.9–10.3)
Chloride: 108 mmol/L (ref 98–111)
Creatinine: 1.08 mg/dL (ref 0.61–1.24)
GFR, Estimated: >60 mL/min (ref >60)
Glucose, Bld: 89 mg/dL (ref 70–99)
Potassium: 3.8 mmol/L (ref 3.5–5.1)
Sodium: 138 mmol/L (ref 135–145)
Total Bilirubin: 0.7 mg/dL (ref 0.0–1.2)
Total Protein: 7.4 g/dL (ref 6.5–8.1)

## 2024-10-30 LAB — LACTATE DEHYDROGENASE: LDH: 155 U/L (ref 105–235)

## 2024-10-30 NOTE — Telephone Encounter (Signed)
 Patient LVM stated he was unable to schedule his MRI due to having a pacemaker. Dr. Anice is aware of it. He will order an ABR. Called patient and told him this information. He understood.

## 2024-10-30 NOTE — Telephone Encounter (Signed)
 I put the referral in- not sure what is going on here

## 2024-10-30 NOTE — Progress Notes (Unsigned)
 [facility] CONSULT NOTE  Patient Care Team: Caro Harlene POUR, NP as PCP - General (Geriatric Medicine) Fernande Elspeth BROCKS, MD (Inactive) as PCP - Electrophysiology (Cardiology) Blanca Elsie RAMAN, MD as Consulting Physician (Cardiology) Rolan Ezra RAMAN, MD as Consulting Physician (Cardiology) Fernande Elspeth BROCKS, MD (Inactive) as Consulting Physician (Cardiology)  CHIEF COMPLAINTS/PURPOSE OF CONSULTATION:  Absolute lymphocytosis.  ASSESSMENT & PLAN:  No problem-specific Assessment & Plan notes found for this encounter.  No orders of the defined types were placed in this encounter.    HISTORY OF PRESENTING ILLNESS:  Isaiah Wu 73 y.o. male is here because of ***  REVIEW OF SYSTEMS:   Constitutional: Denies fevers, chills or abnormal night sweats Eyes: Denies blurriness of vision, double vision or watery eyes Ears, nose, mouth, throat, and face: Denies mucositis or sore throat Respiratory: Denies cough, dyspnea or wheezes Cardiovascular: Denies palpitation, chest discomfort or lower extremity swelling Gastrointestinal:  Denies nausea, heartburn or change in bowel habits Skin: Denies abnormal skin rashes Lymphatics: Denies new lymphadenopathy or easy bruising Neurological:Denies numbness, tingling or new weaknesses Behavioral/Psych: Mood is stable, no new changes  All other systems were reviewed with the patient and are negative.  MEDICAL HISTORY:  Past Medical History:  Diagnosis Date   Actinic keratoses    Per Records from Dr.Kevin Little    Cardiomyopathy (HCC) 04/07/2015   Chronic systolic CHF (congestive heart failure) (HCC) 04/14/2015   a. s/p STJ CRTD b. EF normalized with CRT therapy   Colon polyp    Per Records from Dr.Kevin Little    Elevated PSA    Followed by Dr. 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   placed by Dr.McLean, Per Records from Dr.Kevin Little    LBBB (left bundle branch block)    Non-ischemic cardiomyopathy (HCC)    Per Records from Dr.Kevin Little    Shortness of breath    Per Records from Dr.Kevin Little    Syncope 05/30/2015    SURGICAL HISTORY: Past Surgical History:  Procedure Laterality Date   BIV ICD GENERATOR CHANGEOUT N/A 09/24/2022   Procedure: BIV ICD GENERATOR CHANGEOUT;  Surgeon: Fernande Elspeth BROCKS, MD;  Location: [facility] INVASIVE CV LAB;  Service: Cardiovascular;  Laterality: N/A;   CARDIAC CATHETERIZATION     CATARACT EXTRACTION Right 12/2022   CATARACT EXTRACTION  01/2023   COLONOSCOPY  08/30/2005   Per Records from Dr.Kevin Little    EP IMPLANTABLE DEVICE N/A 07/21/2015   STJ CRTD implanted by Dr Fernande for NICM, CHF   LEFT AND RIGHT HEART CATHETERIZATION WITH CORONARY ANGIOGRAM N/A 04/09/2015   Procedure: LEFT AND RIGHT HEART CATHETERIZATION WITH CORONARY ANGIOGRAM;  Surgeon: Elsie RAMAN Blanca, MD;  Location: [facility] CATH LAB;  Service: Cardiovascular;  Laterality: N/A;   ORIF ELBOW FRACTURE Left 1981   PACEMAKER GENERATOR CHANGE  11/2022   SHOULDER SURGERY Left 05/2008   had to put cadavear bone in and reattach muscle to it  TONSILLECTOMY AND ADENOIDECTOMY  1960's    SOCIAL HISTORY: Social History   Socioeconomic History   Marital status: Married    Spouse name: Not on file   Number of children: Not on file   Years of education: Not on file   Highest education level: Associate degree: occupational, scientist, product/process development, or vocational program  Occupational History   Not on file  Tobacco Use   Smoking status: Former    Current packs/day: 0.00     Average packs/day: 1 pack/day for 3.0 years (3.0 ttl pk-yrs)    Types: Cigarettes    Start date: 12/20/1970    Quit date: 12/20/1973    Years since quitting: 50.8   Smokeless tobacco: Never   Tobacco comments:    'quit smoking in the 1970's  Vaping Use   Vaping status: Never Used  Substance and Sexual Activity   Alcohol use: Yes    Alcohol/week: 4.0 standard drinks of alcohol    Types: 4 Glasses of wine per week   Drug use: No   Sexual activity: Yes  Other Topics Concern   Not on file  Social History Narrative   Diet No      Do you drink/eat things with caffeine Yes, 2 cups of coffee daily      Marital Status Yes What year were you married? 1976      Do you live in a house, apartment, assisted living, condo, trailer, etc.? House      Is it one or more stories? Yes      How many persons live in your home? 2         Do you have any pets in your home?(please list) No      Highest level of education completed: Associate      Current or past profession:Supply chain executive-Now volunteering      Do you exercise?: Yes  Type and how often: Ellipitical cycling 5 days a week      Do you have a Living Will? No      Do you have a DNR form?  No       If not, would you like to discuss one? Yes      Do you have signed POA/HPOA forms? No      Do you have difficulty bathing or dressing yourself? No      Do you have difficulty preparing food or eating? No      Do you have difficulty managing medications? No      Do you have difficulty managing your finances? No      Do you have difficulty affording your medications? No                  Social Drivers of Corporate Investment Banker Strain: Low Risk  (10/15/2024)   Overall Financial Resource Strain (CARDIA)    Difficulty of Paying Living Expenses: Not hard at all  Food Insecurity: No Food Insecurity (10/30/2024)   Hunger Vital Sign    Worried About Running Out of Food in the Last Year: Never true    Ran Out of Food in  the Last Year: Never true  Transportation Needs: No Transportation Needs (10/30/2024)   PRAPARE - Administrator, Civil Service (Medical): No    Lack of Transportation (Non-Medical): No  Physical Activity: Sufficiently Active (10/15/2024)   Exercise Vital Sign    Days of Exercise per Week: 5 days    Minutes of Exercise per Session: 30  min  Stress: No Stress Concern Present (10/15/2024)   [employer] of Occupational Health - Occupational Stress Questionnaire    Feeling of Stress: Not at all  Social Connections: Socially Integrated (10/15/2024)   Social Connection and Isolation Panel    Frequency of Communication with Friends and Family: More than three times a week    Frequency of Social Gatherings with Friends and Family: More than three times a week    Attends Religious Services: More than 4 times per year    Active Member of [employer] or Organizations: Yes    Attends Engineer, Structural: More than 4 times per year    Marital Status: Married  Catering Manager Violence: Not on file    FAMILY HISTORY: Family History  Problem Relation Age of Onset   CVA Mother    High Cholesterol Mother        Per Records from Dr.Kevin Little    Cataracts Mother        Per Records from Dr.Kevin Little    High blood pressure Mother        Per Records from Dr.Kevin Little    Bone cancer Father    Diabetes Father    Colon polyps Father        Forensic Psychologist, Per Records from Dr.Kevin Little    High blood pressure Sister    Colon cancer Neg Hx    Esophageal cancer Neg Hx    Rectal cancer Neg Hx    Stomach cancer Neg Hx     ALLERGIES:  has no known allergies.  MEDICATIONS:  Current Outpatient Medications  Medication Sig Dispense Refill   carvedilol  (COREG ) 12.5 MG tablet TAKE 1 TABLET (12.5MG  TOTAL) BY MOUTH TWICE A DAY WITH MEALS 180 tablet 3   eplerenone  (INSPRA ) 50 MG tablet TAKE 1 TABLET BY MOUTH EVERY DAY 90 tablet 3   famotidine (PEPCID) 20 MG tablet Take 20 mg  by mouth daily.     furosemide  (LASIX ) 20 MG tablet TAKE 1 TABLET (20 MG TOTAL) BY MOUTH DAILY AS NEEDED. NEED FOLLOW UP APPOINTMENT FOR ANYMORE REFILLS (Patient not taking: Reported on 10/23/2024) 90 tablet 0   loratadine (CLARITIN) 10 MG tablet Take 10 mg by mouth as needed for allergies.     Multiple Vitamins-Minerals (CENTRUM SILVER 50+MEN) TABS Take 1 tablet by mouth daily.     sacubitril -valsartan  (ENTRESTO ) 49-51 MG Take 1 tablet by mouth 2 (two) times daily. 60 tablet 11   No current facility-administered medications for this visit.     PHYSICAL EXAMINATION: ECOG PERFORMANCE STATUS: {CHL ONC ECOG ED:[id number]}  Vitals:   10/30/24 1504  BP: (!) 146/98  Pulse: 70  Resp: 17  Temp: (!) 97.1 F (36.2 C)  SpO2: 98%   Filed Weights   10/30/24 1504  Weight: 242 lb 11.2 oz (110.1 kg)    GENERAL:alert, no distress and comfortable SKIN: skin color, texture, turgor are normal, no rashes or significant lesions EYES: normal, conjunctiva are pink and non-injected, sclera clear OROPHARYNX:no exudate, no erythema and lips, buccal mucosa, and tongue normal  NECK: supple, thyroid  normal size, non-tender, without nodularity LYMPH:  no palpable lymphadenopathy in the cervical, axillary or inguinal LUNGS: clear to auscultation and percussion with normal breathing effort HEART: regular rate & rhythm and no murmurs and no lower extremity edema ABDOMEN:abdomen soft, non-tender and normal bowel sounds Musculoskeletal:no cyanosis of digits and no clubbing  PSYCH: alert & oriented x 3 with fluent speech NEURO: no focal motor/sensory deficits  LABORATORY DATA:  I have reviewed the data as listed Lab Results  Component Value Date   WBC 22.4 (H) 10/19/2024   HGB 16.4 10/19/2024   HCT 49.4 10/19/2024   MCV 91.5 10/19/2024   PLT 277 10/19/2024     Chemistry      Component Value Date/Time   NA 137 10/19/2024 0859   K 4.6 10/19/2024 0859   CL 105 10/19/2024 0859   CO2 24 10/19/2024 0859    BUN 16 10/19/2024 0859   CREATININE 1.08 10/19/2024 0859      Component Value Date/Time   CALCIUM 9.8 10/19/2024 0859   ALKPHOS 51 04/07/2015 1910   AST 20 10/19/2024 0859   ALT 34 10/19/2024 0859   BILITOT 0.7 10/19/2024 0859       RADIOGRAPHIC STUDIES: I have personally reviewed the radiological images as listed and agreed with the findings in the report. No results found.  All questions were answered. The patient knows to call the clinic with any problems, questions or concerns. I spent *** minutes in the care of this patient including H and P, review of records, counseling and coordination of care.     Amber Stalls, MD 10/30/2024 3:06 PM

## 2024-10-31 LAB — SURGICAL PATHOLOGY

## 2024-11-01 ENCOUNTER — Other Ambulatory Visit (HOSPITAL_COMMUNITY): Payer: Self-pay

## 2024-11-01 LAB — FLOW CYTOMETRY

## 2024-11-08 ENCOUNTER — Encounter: Payer: Self-pay | Admitting: Nurse Practitioner

## 2024-11-08 ENCOUNTER — Ambulatory Visit (INDEPENDENT_AMBULATORY_CARE_PROVIDER_SITE_OTHER): Payer: Medicare Other | Admitting: Nurse Practitioner

## 2024-11-08 DIAGNOSIS — Z0000 Encounter for general adult medical examination without abnormal findings: Secondary | ICD-10-CM

## 2024-11-08 NOTE — Progress Notes (Signed)
 This service is provided via telemedicine  No vital signs collected/recorded due to the encounter was a telemedicine visit.   Location of patient (ex: home, work):  Home  Patient consents to a telephone visit:  Yes  Location of the provider (ex: office, home):  Office [place].   Name of any referring provider:  na  Names of all persons participating in the telemedicine service and their role in the encounter:  Lamar Shackle, Patient, Donzell Beal, CMA, Harlene An, NP  Time spent on call:  7:11

## 2024-11-08 NOTE — Progress Notes (Signed)
 Chief Complaint  Patient presents with   Medicare Wellness    AWV     Subjective:   Isaiah Wu is a 73 y.o. male who presents for a Medicare Annual Wellness Visit.  Allergies (verified) Patient has no known allergies.   History: Past Medical History:  Diagnosis Date   Actinic keratoses    Per Records from Dr.Kevin Little    Cardiomyopathy (HCC) 04/07/2015   Chronic systolic CHF (congestive heart failure) (HCC) 04/14/2015   a. s/p STJ CRTD b. EF normalized with CRT therapy   Colon polyp    Per Records from Dr.Kevin Little    Elevated PSA    Followed by Dr. 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   placed by Dr.McLean, Per Records from Dr.Kevin Little    LBBB (left bundle branch block)    Non-ischemic cardiomyopathy (HCC)    Per Records from Dr.Kevin Little    Shortness of breath    Per Records from Dr.Kevin Little    Syncope 05/30/2015   Past Surgical History:  Procedure Laterality Date   BIV ICD GENERATOR CHANGEOUT N/A 09/24/2022   Procedure: BIV ICD GENERATOR CHANGEOUT;  Surgeon: Fernande Elspeth BROCKS, MD;  Location: [facility] INVASIVE CV LAB;  Service: Cardiovascular;  Laterality: N/A;   CARDIAC CATHETERIZATION     CATARACT EXTRACTION Right 12/2022   CATARACT EXTRACTION  01/2023   COLONOSCOPY  08/30/2005   Per Records from Dr.Kevin Little    EP IMPLANTABLE DEVICE N/A 07/21/2015   STJ CRTD implanted by Dr Fernande for NICM, CHF   LEFT AND RIGHT HEART CATHETERIZATION WITH CORONARY ANGIOGRAM N/A 04/09/2015   Procedure:  LEFT AND RIGHT HEART CATHETERIZATION WITH CORONARY ANGIOGRAM;  Surgeon: Elsie GORMAN Somerset, MD;  Location: [facility] CATH LAB;  Service: Cardiovascular;  Laterality: N/A;   ORIF ELBOW FRACTURE Left 1981   PACEMAKER GENERATOR CHANGE  11/2022   SHOULDER SURGERY Left 05/2008   had to put cadavear bone in and reattach muscle to it   TONSILLECTOMY AND ADENOIDECTOMY  1960's   Family History  Problem Relation Age of Onset   CVA Mother    High Cholesterol Mother        Per Records from Dr.Kevin Little    Cataracts Mother        Per Records from Dr.Kevin Little    High blood pressure Mother        Per Records from Dr.Kevin Little    Bone cancer Father    Diabetes Father    Colon polyps Father        Precamcerous, Per Records from Dr.Kevin Little    High blood pressure Sister    Colon cancer Neg Hx    Esophageal cancer Neg Hx    Rectal cancer Neg Hx    Stomach cancer Neg Hx    Social History   Occupational History   Not on file  Tobacco Use   Smoking status: Former  Current packs/day: 0.00    Average packs/day: 1 pack/day for 3.0 years (3.0 ttl pk-yrs)    Types: Cigarettes    Start date: 12/20/1970    Quit date: 12/20/1973    Years since quitting: 50.9   Smokeless tobacco: Never   Tobacco comments:    'quit smoking in the 1970's  Vaping Use   Vaping status: Never Used  Substance and Sexual Activity   Alcohol use: Yes    Alcohol/week: 4.0 standard drinks of alcohol    Types: 4 Glasses of wine per week   Drug use: No   Sexual activity: Yes   Tobacco Counseling Counseling given: Not Answered Tobacco comments: 'quit smoking in the 1970's  SDOH Screenings   Food Insecurity: No Food Insecurity (10/30/2024)  Housing: Low Risk  (10/30/2024)  Transportation Needs: No Transportation Needs (10/30/2024)  Utilities: Not At Risk (10/30/2024)  Alcohol Screen: Low Risk  (10/15/2024)  Depression (PHQ2-9): Low Risk  (11/08/2024)  Financial Resource Strain: Low Risk  (10/15/2024)  Physical  Activity: Sufficiently Active (10/15/2024)  Social Connections: Socially Integrated (10/15/2024)  Stress: No Stress Concern Present (10/15/2024)  Tobacco Use: Medium Risk (11/08/2024)   See flowsheets for full screening details  Depression Screen PHQ 2 & 9 Depression Scale- Over the past 2 weeks, how often have you been bothered by any of the following problems? Little interest or pleasure in doing things: 0 Feeling down, depressed, or hopeless (PHQ Adolescent also includes...irritable): 0 PHQ-2 Total Score: 0     Goals Addressed   None    Visit info / Clinical Intake: Medicare Wellness Visit Type:: Subsequent Annual Wellness Visit Persons participating in visit:: patient Medicare Wellness Visit Mode:: Video If Telephone or Video please confirm:: I connected with the patient using audio enabled telemedicine application and verified that I am speaking with the correct person using two identifiers; I discussed the limitations of evaluation and management by telemedicine; The patient expressed understanding and agreed to proceed Patient Location:: office Provider Location:: office Information given by:: patient Interpreter Needed?: No Pre-visit prep was completed: no Living arrangements:: lives with spouse/significant other Patient's Overall Health Status Rating: very good Typical amount of pain: none Does pain affect daily life?: no Are you currently prescribed opioids?: no  Dietary Habits and Nutritional Risks How many meals a day?: 2 Eats fruit and vegetables daily?: yes Most meals are obtained by: preparing own meals In the last 2 weeks, have you had any of the following?: none  Functional Status Activities of Daily Living (to include ambulation/medication): Independent Ambulation: Independent Medication Administration: Independent Home Management: Independent Primary transportation is: driving Concerns about vision?: (!) yes Concerns about hearing?: (!) yes Uses  hearing aids?: no Hear whispered voice?: (!) no *in-person visit only*  Fall Screening Falls in the past year?: 0 Number of falls in past year: 0 Was there an injury with Fall?: 0 Fall Risk Category Calculator: 0 Patient Fall Risk Level: Low Fall Risk  Fall Risk Patient at Risk for Falls Due to: No Fall Risks Fall risk Follow up: Falls evaluation completed  Home and Transportation Safety: All rugs have non-skid backing?: yes All stairs or steps have railings?: yes Grab bars in the bathtub or shower?: yes Have non-skid surface in bathtub or shower?: yes Good home lighting?: yes Regular seat belt use?: yes Hospital stays in the last year:: no  Cognitive Assessment Difficulty concentrating, remembering, or making decisions? : no Will 6CIT or Mini Cog be Completed: yes What year is it?: 0 points What  month is it?: 0 points Give patient an address phrase to remember (5 components): [address] [place] . About what time is it?: 0 points Count backwards from 20 to 1: 0 points Say the months of the year in reverse: 0 points Repeat the address phrase from earlier: 0 points 6 CIT Score: 0 points  Advance Directives (For Healthcare) Does Patient Have a Medical Advance Directive?: No Does patient want to make changes to medical advance directive?: No - Patient declined Would patient like information on creating a medical advance directive?: Yes (MAU/Ambulatory/Procedural Areas - Information given)  Reviewed/Updated  Reviewed/Updated: Reviewed All (Medical, Surgical, Family, Medications, Allergies, Care Teams, Patient Goals)        Objective:    There were no vitals filed for this visit. There is no height or weight on file to calculate BMI.  Current Medications (verified) Outpatient Encounter Medications as of 11/08/2024  Medication Sig   carvedilol  (COREG ) 12.5 MG tablet TAKE 1 TABLET (12.5MG  TOTAL) BY MOUTH TWICE A DAY WITH MEALS   eplerenone  (INSPRA ) 50 MG  tablet TAKE 1 TABLET BY MOUTH EVERY DAY   famotidine (PEPCID) 20 MG tablet Take 20 mg by mouth daily.   furosemide  (LASIX ) 20 MG tablet TAKE 1 TABLET (20 MG TOTAL) BY MOUTH DAILY AS NEEDED. NEED FOLLOW UP APPOINTMENT FOR ANYMORE REFILLS   loratadine (CLARITIN) 10 MG tablet Take 10 mg by mouth as needed for allergies.   Multiple Vitamins-Minerals (CENTRUM SILVER 50+MEN) TABS Take 1 tablet by mouth daily.   sacubitril -valsartan  (ENTRESTO ) 49-51 MG Take 1 tablet by mouth 2 (two) times daily.   UNABLE TO FIND Med Name: Tolvar Cream Sig: Use once daily   No facility-administered encounter medications on file as of 11/08/2024.   Hearing/Vision screen Vision Screening - Comments:: [facility] every 6 Months.  Immunizations and Health Maintenance Health Maintenance  Topic Date Due   Medicare Annual Wellness (AWV)  11/06/2024   COVID-19 Vaccine (6 - 2025-26 season) 04/30/2025   DTaP/Tdap/Td (4 - Td or Tdap) 03/07/2031   Colonoscopy  07/29/2031   Pneumococcal Vaccine: 50+ Years  Completed   Influenza Vaccine  Completed   Hepatitis C Screening  Completed   Zoster Vaccines- Shingrix  Completed   Meningococcal B Vaccine  Aged Out   Hepatitis B Vaccines 19-59 Average Risk  Discontinued        Assessment/Plan:  This is a routine wellness examination for Isaiah Wu.  Patient Care Team: Caro Harlene POUR, NP as PCP - General (Geriatric Medicine) Fernande Elspeth BROCKS, MD (Inactive) as PCP - Electrophysiology (Cardiology) Blanca Elsie RAMAN, MD as Consulting Physician (Cardiology) Rolan Ezra RAMAN, MD as Consulting Physician (Cardiology) Fernande Elspeth BROCKS, MD (Inactive) as Consulting Physician (Cardiology)  I have personally reviewed and noted the following in the patient's chart:   Medical and social history Use of alcohol, tobacco or illicit drugs  Current medications and supplements including opioid prescriptions. Functional ability and status Nutritional status Physical  activity Advanced directives List of other physicians Hospitalizations, surgeries, and ER visits in previous 12 months Vitals Screenings to include cognitive, depression, and falls Referrals and appointments  No orders of the defined types were placed in this encounter.  In addition, I have reviewed and discussed with patient certain preventive protocols, quality metrics, and best practice recommendations. A written personalized care plan for preventive services as well as general preventive health recommendations were provided to patient.   Harlene POUR Caro, NP   11/08/2024   No follow-ups  on file.  After Visit Summary: (MyChart) Due to this being a telephonic visit, the after visit summary with patients personalized plan was offered to patient via MyChart

## 2024-11-08 NOTE — Patient Instructions (Signed)
 Isaiah Wu,  Thank you for taking the time for your Medicare Wellness Visit. I appreciate your continued commitment to your health goals. Please review the care plan we discussed, and feel free to reach out if I can assist you further.  Please note that Annual Wellness Visits do not include a physical exam. Some assessments may be limited, especially if the visit was conducted virtually. If needed, we may recommend an in-person follow-up with your provider.  Ongoing Care Seeing your primary care provider every 3 to 6 months helps us  monitor your health and provide consistent, personalized care.   Referrals If a referral was made during today's visit and you haven't received any updates within two weeks, please contact the referred provider directly to check on the status.  Recommended Screenings:  Health Maintenance  Topic Date Due   Medicare Annual Wellness Visit  11/06/2024   COVID-19 Vaccine (6 - 2025-26 season) 04/30/2025   DTaP/Tdap/Td vaccine (4 - Td or Tdap) 03/07/2031   Colon Cancer Screening  07/29/2031   Pneumococcal Vaccine for age over 34  Completed   Flu Shot  Completed   Hepatitis C Screening  Completed   Zoster (Shingles) Vaccine  Completed   Meningitis B Vaccine  Aged Out   Hepatitis B Vaccine  Discontinued       11/08/2024    8:25 AM  Advanced Directives  Does Patient Have a Medical Advance Directive? No  Does patient want to make changes to medical advance directive? No - Patient declined    Vision: Annual vision screenings are recommended for early detection of glaucoma, cataracts, and diabetic retinopathy. These exams can also reveal signs of chronic conditions such as diabetes and high blood pressure.  Dental: Annual dental screenings help detect early signs of oral cancer, gum disease, and other conditions linked to overall health, including heart disease and diabetes.  Please see the attached documents for additional preventive care recommendations.

## 2024-11-19 ENCOUNTER — Other Ambulatory Visit: Payer: Self-pay | Admitting: *Deleted

## 2024-11-19 DIAGNOSIS — D72820 Lymphocytosis (symptomatic): Secondary | ICD-10-CM

## 2024-11-20 ENCOUNTER — Other Ambulatory Visit: Payer: Self-pay

## 2024-11-20 ENCOUNTER — Inpatient Hospital Stay: Attending: Hematology and Oncology

## 2024-11-20 ENCOUNTER — Inpatient Hospital Stay

## 2024-11-20 ENCOUNTER — Inpatient Hospital Stay: Admitting: Hematology and Oncology

## 2024-11-20 VITALS — BP 123/68 | HR 70 | Temp 98.0°F | Resp 16 | Wt 237.8 lb

## 2024-11-20 DIAGNOSIS — D72820 Lymphocytosis (symptomatic): Secondary | ICD-10-CM

## 2024-11-20 DIAGNOSIS — Z808 Family history of malignant neoplasm of other organs or systems: Secondary | ICD-10-CM | POA: Diagnosis not present

## 2024-11-20 DIAGNOSIS — Z87891 Personal history of nicotine dependence: Secondary | ICD-10-CM | POA: Diagnosis not present

## 2024-11-20 DIAGNOSIS — C9110 Chronic lymphocytic leukemia of B-cell type not having achieved remission: Secondary | ICD-10-CM

## 2024-11-20 LAB — CMP (CANCER CENTER ONLY)
ALT: 41 U/L (ref 0–44)
AST: 24 U/L (ref 15–41)
Albumin: 4.5 g/dL (ref 3.5–5.0)
Alkaline Phosphatase: 72 U/L (ref 38–126)
Anion gap: 10 (ref 5–15)
BUN: 18 mg/dL (ref 8–23)
CO2: 24 mmol/L (ref 22–32)
Calcium: 9.6 mg/dL (ref 8.9–10.3)
Chloride: 106 mmol/L (ref 98–111)
Creatinine: 1.06 mg/dL (ref 0.61–1.24)
GFR, Estimated: >60 mL/min (ref >60)
Glucose, Bld: 135 mg/dL — ABNORMAL HIGH (ref 70–99)
Potassium: 4.1 mmol/L (ref 3.5–5.1)
Sodium: 139 mmol/L (ref 135–145)
Total Bilirubin: 0.3 mg/dL (ref 0.0–1.2)
Total Protein: 7.6 g/dL (ref 6.5–8.1)

## 2024-11-20 LAB — CBC WITH DIFFERENTIAL (CANCER CENTER ONLY)
Abs Immature Granulocytes: 0.02 K/uL (ref 0.00–0.07)
Basophils Absolute: 0.1 K/uL (ref 0.0–0.1)
Basophils Relative: 1 %
Eosinophils Absolute: 0.4 K/uL (ref 0.0–0.5)
Eosinophils Relative: 2 %
HCT: 47.3 % (ref 39.0–52.0)
Hemoglobin: 16.2 g/dL (ref 13.0–17.0)
Immature Granulocytes: 0 %
Lymphocytes Relative: 73 %
Lymphs Abs: 16.1 K/uL — ABNORMAL HIGH (ref 0.7–4.0)
MCH: 30.7 pg (ref 26.0–34.0)
MCHC: 34.2 g/dL (ref 30.0–36.0)
MCV: 89.8 fL (ref 80.0–100.0)
Monocytes Absolute: 1.1 K/uL — ABNORMAL HIGH (ref 0.1–1.0)
Monocytes Relative: 5 %
Neutro Abs: 4.2 K/uL (ref 1.7–7.7)
Neutrophils Relative %: 19 %
Platelet Count: 256 K/uL (ref 150–400)
RBC: 5.27 MIL/uL (ref 4.22–5.81)
RDW: 13.2 % (ref 11.5–15.5)
Smear Review: NORMAL
WBC Count: 22 K/uL — ABNORMAL HIGH (ref 4.0–10.5)
nRBC: 0 % (ref 0.0–0.2)

## 2024-11-20 NOTE — Progress Notes (Unsigned)
 [facility] CONSULT NOTE  Patient Care Team: Caro Harlene POUR, NP as PCP - General (Geriatric Medicine) Fernande Elspeth BROCKS, MD (Inactive) as PCP - Electrophysiology (Cardiology) Blanca Elsie RAMAN, MD as Consulting Physician (Cardiology) Rolan Ezra RAMAN, MD as Consulting Physician (Cardiology) Fernande Elspeth BROCKS, MD (Inactive) as Consulting Physician (Cardiology)  CHIEF COMPLAINTS/PURPOSE OF CONSULTATION:  Absolute lymphocytosis.  ASSESSMENT & PLAN:   This is a very pleasant 73 yr old male patient with PMH significant for non ischemic cardiomyopathy and elevated PSA who was referred to hematology for evaluation of persistent lymphocytosis. He is clinically asymptomatic from this. No concerns on exam today. No palpable lymphadenopathy. We have discussed the possible differentials from lymphocytosis. Assessment & Plan Chronic lymphocytic leukemia (CLL), Rai stage 0 with lymphocytosis Confirmed CLL via flow cytometry, Rai stage 0 with lymphocytosis. No symptoms or treatment indicated. Discussed risk stratification and prognosis. Explained chronic nature and potential for normal life expectancy. Discussed future treatment options. - Ordered FISH panel and IGHV mutation testing. - Monitor CBC, metabolic panel, and lactate dehydrogenase levels regularly. - Schedule follow-up appointments 3-4 times a year.  HISTORY OF PRESENTING ILLNESS:  Isaiah Wu 73 y.o. male is here because of absolute lymphocytosis  Discussed the use of AI scribe software for clinical note transcription with the patient, who gave verbal consent to proceed.  History of Present Illness Isaiah Wu is a 73 year old male with chronic lymphocytic leukemia (CLL) who presents for follow-up and risk stratification.  He was diagnosed with chronic lymphocytic leukemia (CLL) following a flow cytometry test that confirmed the presence of abnormal blood cells. He is currently asymptomatic with no  significant fatigue, weight loss, lymphadenopathy, splenomegaly, or recurrent infections.   All other systems were reviewed with the patient and are negative.  MEDICAL HISTORY:  Past Medical History:  Diagnosis Date   Actinic keratoses    Per Records from Dr.Kevin Little    Cardiomyopathy (HCC) 04/07/2015   Chronic systolic CHF (congestive heart failure) (HCC) 04/14/2015   a. s/p STJ CRTD b. EF normalized with CRT therapy   Colon polyp    Per Records from Dr.Kevin Little    Elevated PSA    Followed by Dr. 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   placed by Dr.McLean, Per Records from Dr.Kevin Little    LBBB (left bundle branch block)    Non-ischemic cardiomyopathy (HCC)    Per Records from Dr.Kevin Little    Shortness of breath    Per Records from Dr.Kevin Little    Syncope 05/30/2015    SURGICAL HISTORY: Past Surgical History:  Procedure Laterality Date   BIV ICD GENERATOR CHANGEOUT N/A 09/24/2022   Procedure: BIV ICD GENERATOR CHANGEOUT;  Surgeon: Fernande Elspeth BROCKS, MD;  Location: [facility] INVASIVE CV LAB;  Service: Cardiovascular;  Laterality: N/A;   CARDIAC CATHETERIZATION     CATARACT EXTRACTION Right 12/2022   CATARACT EXTRACTION  01/2023   COLONOSCOPY  08/30/2005   Per Records from Dr.Kevin Little    EP IMPLANTABLE DEVICE N/A 07/21/2015   STJ CRTD implanted by Dr Fernande for NICM, CHF   LEFT AND RIGHT HEART CATHETERIZATION WITH CORONARY ANGIOGRAM N/A 04/09/2015   Procedure: LEFT AND RIGHT HEART CATHETERIZATION WITH CORONARY  ANGIOGRAM;  Surgeon: Elsie GORMAN Somerset, MD;  Location: [facility] CATH LAB;  Service: Cardiovascular;  Laterality: N/A;   ORIF ELBOW FRACTURE Left 1981   PACEMAKER GENERATOR CHANGE  11/2022   SHOULDER SURGERY Left 05/2008   had to put cadavear bone in and reattach muscle to it   TONSILLECTOMY AND ADENOIDECTOMY  1960's    SOCIAL HISTORY: Social History   Socioeconomic History   Marital status: Married    Spouse name: Not on file   Number of children: Not on file   Years of education: Not on file   Highest education level: Associate degree: occupational, scientist, product/process development, or vocational program  Occupational History   Not on file  Tobacco Use   Smoking status: Former    Current packs/day: 0.00    Average packs/day: 1 pack/day for 3.0 years (3.0 ttl pk-yrs)    Types: Cigarettes    Start date: 12/20/1970    Quit date: 12/20/1973    Years since quitting: 50.9   Smokeless tobacco: Never   Tobacco comments:    'quit smoking in the 1970's  Vaping Use   Vaping status: Never Used  Substance and Sexual Activity   Alcohol use: Yes    Alcohol/week: 4.0 standard drinks of alcohol    Types: 4 Glasses of wine per week   Drug use: No   Sexual activity: Yes  Other Topics Concern   Not on file  Social History Narrative   Diet No      Do you drink/eat things with caffeine Yes, 2 cups of coffee daily      Marital Status Yes What year were you married? 1976      Do you live in a house, apartment, assisted living, condo, trailer, etc.? House      Is it one or more stories? Yes      How many persons live in your home? 2         Do you have any pets in your home?(please list) No      Highest level of education completed: Associate      Current or past profession:Supply chain executive-Now volunteering      Do you exercise?: Yes  Type and how often: Ellipitical cycling 5 days a week      Do you have a Living Will? No      Do you have a DNR form?  No       If not, would you like to discuss one? Yes       Do you have signed POA/HPOA forms? No      Do you have difficulty bathing or dressing yourself? No      Do you have difficulty preparing food or eating? No      Do you have difficulty managing medications? No      Do you have difficulty managing your finances? No      Do you have difficulty affording your medications? No                  Social Drivers of Corporate Investment Banker Strain: Low Risk  (10/15/2024)   Overall Financial Resource Strain (CARDIA)    Difficulty of Paying Living Expenses:  Not hard at all  Food Insecurity: No Food Insecurity (10/30/2024)   Hunger Vital Sign    Worried About Running Out of Food in the Last Year: Never true    Ran Out of Food in the Last Year: Never true  Transportation Needs: No Transportation Needs (10/30/2024)   PRAPARE - Administrator, Civil Service (Medical): No    Lack of Transportation (Non-Medical): No  Physical Activity: Sufficiently Active (10/15/2024)   Exercise Vital Sign    Days of Exercise per Week: 5 days    Minutes of Exercise per Session: 30 min  Stress: No Stress Concern Present (10/15/2024)   [employer] of Occupational Health - Occupational Stress Questionnaire    Feeling of Stress: Not at all  Social Connections: Socially Integrated (10/15/2024)   Social Connection and Isolation Panel    Frequency of Communication with Friends and Family: More than three times a week    Frequency of Social Gatherings with Friends and Family: More than three times a week    Attends Religious Services: More than 4 times per year    Active Member of [employer] or Organizations: Yes    Attends Engineer, Structural: More than 4 times per year    Marital Status: Married  Catering Manager Violence: Not on file    FAMILY HISTORY: Family History  Problem Relation Age of Onset   CVA Mother    High Cholesterol Mother        Per Records from Dr.Kevin Little    Cataracts Mother        Per Records from  Dr.Kevin Little    High blood pressure Mother        Per Records from Dr.Kevin Little    Bone cancer Father    Diabetes Father    Colon polyps Father        Forensic Psychologist, Per Records from Dr.Kevin Little    High blood pressure Sister    Colon cancer Neg Hx    Esophageal cancer Neg Hx    Rectal cancer Neg Hx    Stomach cancer Neg Hx     ALLERGIES:  has no known allergies.  MEDICATIONS:  Current Outpatient Medications  Medication Sig Dispense Refill   carvedilol  (COREG ) 12.5 MG tablet TAKE 1 TABLET (12.5MG  TOTAL) BY MOUTH TWICE A DAY WITH MEALS 180 tablet 3   eplerenone  (INSPRA ) 50 MG tablet TAKE 1 TABLET BY MOUTH EVERY DAY 90 tablet 3   famotidine (PEPCID) 20 MG tablet Take 20 mg by mouth daily.     furosemide  (LASIX ) 20 MG tablet TAKE 1 TABLET (20 MG TOTAL) BY MOUTH DAILY AS NEEDED. NEED FOLLOW UP APPOINTMENT FOR ANYMORE REFILLS 90 tablet 0   loratadine (CLARITIN) 10 MG tablet Take 10 mg by mouth as needed for allergies.     Multiple Vitamins-Minerals (CENTRUM SILVER 50+MEN) TABS Take 1 tablet by mouth daily.     sacubitril -valsartan  (ENTRESTO ) 49-51 MG Take 1 tablet by mouth 2 (two) times daily. 60 tablet 11   UNABLE TO FIND Med Name: Tolvar Cream Sig: Use once daily     No current facility-administered medications for this visit.     PHYSICAL EXAMINATION: ECOG PERFORMANCE STATUS: 0 - Asymptomatic  Vitals:   11/20/24 1447  BP: 123/68  Pulse: 70  Resp: 16  Temp: 98 F (36.7 C)  SpO2: 97%   Filed Weights   11/20/24 1447  Weight: 237 lb 12.8 oz (107.9 kg)    GENERAL:alert, no distress and  comfortable  LABORATORY DATA:  I have reviewed the data as listed Lab Results  Component Value Date   WBC 22.0 (H) 11/20/2024   HGB 16.2 11/20/2024   HCT 47.3 11/20/2024   MCV 89.8 11/20/2024   PLT 256 11/20/2024     Chemistry      Component Value Date/Time   NA 139 11/20/2024 1422   K 4.1 11/20/2024 1422   CL 106 11/20/2024 1422   CO2 24 11/20/2024 1422   BUN 18  11/20/2024 1422   CREATININE 1.06 11/20/2024 1422   CREATININE 1.08 10/19/2024 0859      Component Value Date/Time   CALCIUM 9.6 11/20/2024 1422   ALKPHOS 72 11/20/2024 1422   AST 24 11/20/2024 1422   ALT 41 11/20/2024 1422   BILITOT 0.3 11/20/2024 1422       RADIOGRAPHIC STUDIES: I have personally reviewed the radiological images as listed and agreed with the findings in the report. No results found.  All questions were answered. The patient knows to call the clinic with any problems, questions or concerns. I spent 30 minutes in the care of this patient including H and P, review of records, counseling and coordination of care.     Amber Stalls, MD 11/20/2024 2:58 PM

## 2024-11-29 LAB — FISH HES LEUKEMIA, 4Q12 REA

## 2024-11-29 LAB — IGVH SOMATIC HYPERMUTATION

## 2024-12-05 ENCOUNTER — Encounter: Payer: Self-pay | Admitting: Cardiology

## 2024-12-05 ENCOUNTER — Ambulatory Visit: Attending: Cardiovascular Disease | Admitting: Cardiology

## 2024-12-05 VITALS — BP 140/80 | HR 70 | Ht 73.23 in | Wt 236.0 lb

## 2024-12-05 DIAGNOSIS — I428 Other cardiomyopathies: Secondary | ICD-10-CM | POA: Diagnosis not present

## 2024-12-05 DIAGNOSIS — I5022 Chronic systolic (congestive) heart failure: Secondary | ICD-10-CM

## 2024-12-05 DIAGNOSIS — Z95810 Presence of automatic (implantable) cardiac defibrillator: Secondary | ICD-10-CM

## 2024-12-05 NOTE — Patient Instructions (Signed)
.  eple

## 2024-12-05 NOTE — Progress Notes (Signed)
°  Electrophysiology Office Note:   Date:  12/05/2024  ID:  Isaiah Wu, DOB Sep 10, 1951, MRN [id number]  Primary Cardiologist: None Primary Heart Failure: Ezra Shuck, MD Electrophysiologist: Ethelyne Erich Gladis Norton, MD      History of Present Illness:   Isaiah Wu is a 73 y.o. male with h/o chronic systolic heart failure, hypertension, left bundle branch block seen today for routine electrophysiology followup.   Discussed the use of AI scribe software for clinical note transcription with the patient, who gave verbal consent to proceed.  History of Present Illness Isaiah Wu is a 73 year old male with a cardiac device who presents for routine follow-up of his device function.  He has had a cardiac device since July 21, 2015, and is dependent on it. The device's LV threshold is 2.25 with an output of 2.5, and the RV output is 2.5. No issues with the device are reported, and it is functioning well.  The battery of the device was changed approximately a year and a half ago, in October, and currently has 3.8 years of battery life remaining. He is able to perform his usual activities with some limitations. He notices voice changes as a signal to slow down.   he denies chest pain, palpitations, dyspnea, PND, orthopnea, nausea, vomiting, dizziness, syncope, edema, weight gain, or early satiety.   Review of systems complete and found to be negative unless listed in HPI.      EP Information / Studies Reviewed:    EKG is ordered today. Personal review as below.  EKG Interpretation Date/Time:  Wednesday December 05 2024 16:19:27 EST Ventricular Rate:  70 PR Interval:  158 QRS Duration:  152 QT Interval:  458 QTC Calculation: 494 R Axis:   264  Text Interpretation: AV dual-paced rhythm When compared with ECG of 12-Apr-2024 08:31, No significant change since last tracing Confirmed by Thalia Turkington ([id number]) on 12/05/2024 4:30:42 PM   ICD Interrogation-  reviewed  in detail today,  See PACEART report.  Device History: Abbott BiV ICD implanted 07/21/2015, generator change 05/29/2022 for chronic systolic heart failure History of appropriate therapy: No History of AAD therapy: No   Risk Assessment/Calculations:           Physical Exam:   VS:  BP (!) 140/80 (BP Location: Left Arm, Patient Position: Sitting, Cuff Size: Large)   Pulse 70   Ht 6' 1.23 (1.86 m)   Wt 236 lb (107 kg)   SpO2 98%   BMI 30.94 kg/m    Wt Readings from Last 3 Encounters:  12/05/24 236 lb (107 kg)  11/20/24 237 lb 12.8 oz (107.9 kg)  10/30/24 242 lb 11.2 oz (110.1 kg)     GEN: Well nourished, well developed in no acute distress NECK: No JVD; No carotid bruits CARDIAC: Regular rate and rhythm, no murmurs, rubs, gallops RESPIRATORY:  Clear to auscultation without rales, wheezing or rhonchi  ABDOMEN: Soft, non-tender, non-distended EXTREMITIES:  No edema; No deformity   ASSESSMENT AND PLAN:    Chronic systolic dysfunction s/p Abbott CRT-D  Due to nonischemic cardiomyopathy  euvolemic today Stable on an appropriate medical regimen Normal ICD function See Pace Art report No changes today  2.  Left bundle branch block: Post CRT-D.  Disposition:   Follow up with EP Team in 12 months   Signed, Tassie Pollett Gladis Norton, MD

## 2024-12-24 ENCOUNTER — Ambulatory Visit: Payer: Self-pay

## 2024-12-24 DIAGNOSIS — I5022 Chronic systolic (congestive) heart failure: Secondary | ICD-10-CM | POA: Diagnosis not present

## 2024-12-25 LAB — CUP PACEART REMOTE DEVICE CHECK
Battery Remaining Longevity: 43 mo
Battery Remaining Percentage: 63 %
Battery Voltage: 2.96 V
Brady Statistic AP VP Percent: 41 %
Brady Statistic AP VS Percent: 1 %
Brady Statistic AS VP Percent: 59 %
Brady Statistic AS VS Percent: 1 %
Brady Statistic RA Percent Paced: 40 %
Date Time Interrogation Session: 20260105023850
HighPow Impedance: 83 Ohm
HighPow Impedance: 83 Ohm
Implantable Lead Connection Status: 753985
Implantable Lead Connection Status: 753985
Implantable Lead Connection Status: 753985
Implantable Lead Implant Date: 20160801
Implantable Lead Implant Date: 20160801
Implantable Lead Implant Date: 20160801
Implantable Lead Location: 753858
Implantable Lead Location: 753859
Implantable Lead Location: 753860
Implantable Lead Model: 7122
Implantable Pulse Generator Implant Date: 20231006
Lead Channel Impedance Value: 410 Ohm
Lead Channel Impedance Value: 450 Ohm
Lead Channel Impedance Value: 980 Ohm
Lead Channel Pacing Threshold Amplitude: 1 V
Lead Channel Pacing Threshold Amplitude: 1 V
Lead Channel Pacing Threshold Amplitude: 2.25 V
Lead Channel Pacing Threshold Pulse Width: 0.5 ms
Lead Channel Pacing Threshold Pulse Width: 0.6 ms
Lead Channel Pacing Threshold Pulse Width: 1.5 ms
Lead Channel Sensing Intrinsic Amplitude: 11.7 mV
Lead Channel Sensing Intrinsic Amplitude: 3.2 mV
Lead Channel Setting Pacing Amplitude: 2 V
Lead Channel Setting Pacing Amplitude: 2.5 V
Lead Channel Setting Pacing Amplitude: 2.5 V
Lead Channel Setting Pacing Pulse Width: 0.6 ms
Lead Channel Setting Pacing Pulse Width: 1.5 ms
Lead Channel Setting Sensing Sensitivity: 0.5 mV
Pulse Gen Serial Number: 8927074

## 2024-12-26 ENCOUNTER — Ambulatory Visit: Payer: Self-pay | Admitting: Cardiology

## 2024-12-27 NOTE — Progress Notes (Signed)
 Remote ICD Transmission

## 2025-01-10 ENCOUNTER — Ambulatory Visit: Payer: Self-pay | Admitting: Cardiology

## 2025-01-10 LAB — CUP PACEART INCLINIC DEVICE CHECK
Battery Remaining Longevity: 44 mo
Brady Statistic RA Percent Paced: 26 %
Brady Statistic RV Percent Paced: 99.84 %
Date Time Interrogation Session: 20251217164500
HighPow Impedance: 84.375
Implantable Lead Connection Status: 753985
Implantable Lead Connection Status: 753985
Implantable Lead Connection Status: 753985
Implantable Lead Implant Date: 20160801
Implantable Lead Implant Date: 20160801
Implantable Lead Implant Date: 20160801
Implantable Lead Location: 753858
Implantable Lead Location: 753859
Implantable Lead Location: 753860
Implantable Lead Model: 7122
Implantable Pulse Generator Implant Date: 20231006
Lead Channel Impedance Value: 425 Ohm
Lead Channel Impedance Value: 512.5 Ohm
Lead Channel Impedance Value: 987.5 Ohm
Lead Channel Pacing Threshold Amplitude: 1 V
Lead Channel Pacing Threshold Amplitude: 1 V
Lead Channel Pacing Threshold Amplitude: 1 V
Lead Channel Pacing Threshold Amplitude: 1 V
Lead Channel Pacing Threshold Amplitude: 2.25 V
Lead Channel Pacing Threshold Amplitude: 2.25 V
Lead Channel Pacing Threshold Pulse Width: 0.5 ms
Lead Channel Pacing Threshold Pulse Width: 0.5 ms
Lead Channel Pacing Threshold Pulse Width: 0.6 ms
Lead Channel Pacing Threshold Pulse Width: 0.6 ms
Lead Channel Pacing Threshold Pulse Width: 1.5 ms
Lead Channel Pacing Threshold Pulse Width: 1.5 ms
Lead Channel Sensing Intrinsic Amplitude: 11.7 mV
Lead Channel Sensing Intrinsic Amplitude: 4 mV
Lead Channel Setting Pacing Amplitude: 2 V
Lead Channel Setting Pacing Amplitude: 2.5 V
Lead Channel Setting Pacing Amplitude: 2.5 V
Lead Channel Setting Pacing Pulse Width: 0.6 ms
Lead Channel Setting Pacing Pulse Width: 1.5 ms
Lead Channel Setting Sensing Sensitivity: 0.5 mV
Pulse Gen Serial Number: 8927074

## 2025-01-14 ENCOUNTER — Telehealth (HOSPITAL_COMMUNITY): Payer: Self-pay

## 2025-01-14 ENCOUNTER — Other Ambulatory Visit (HOSPITAL_COMMUNITY): Payer: Self-pay

## 2025-01-14 NOTE — Telephone Encounter (Signed)
 Advanced Heart Failure Patient Advocate Encounter  The patient was renewed for a [employer] grant that will help cover the cost of Carvedilol , Entresto , Eplerenone .  Total amount awarded, $7,500.  Effective: 12/15/2024 - 12/14/2025.  BIN [id number] PCN [id number] Group [id number] ID [id number]  Pharmacy provided with approval and processing information. Patient informed via MyChart.  Rachel DEL, CPhT Rx Patient Advocate Phone: [phone]

## 2025-01-23 ENCOUNTER — Other Ambulatory Visit (INDEPENDENT_AMBULATORY_CARE_PROVIDER_SITE_OTHER): Payer: Self-pay

## 2025-01-23 DIAGNOSIS — H903 Sensorineural hearing loss, bilateral: Secondary | ICD-10-CM

## 2025-01-23 NOTE — Telephone Encounter (Signed)
 Patient called and left voicemail at 1:11 PM 01/23/2025. Patient stated that Dr. Anice ordered an MRI but he can not get the MRI due to having a pace maker and wanted to know what next steps to take so he can get his hearing aids. Please advise.

## 2025-01-23 NOTE — Telephone Encounter (Signed)
 i called and spoke with the patient let him know knight was in surgery but i did reach out to him and that i would give him a call back either sometime today or tomorrow when knight is back in office i explained from knights notes it looked like he might want to go in the route of getting a hearing test done patient thanked me for giving him a call i gave him my name and assured him at latest i would get back to him would be sometime tomorrow morning he said that was perfectly fine and thanked me for calling him back.

## 2025-01-23 NOTE — Telephone Encounter (Signed)
 I sent a referral to [facility] audiology. He will get an ABR test. I want to see him or schedule a phone call after the testing is complete

## 2025-01-23 NOTE — Telephone Encounter (Signed)
 Called patient no answer I left a voicemail asking him to give us  a call back about his next steps to get his hearing aids. I explained that I would give him another call tomorrow and try reaching out again.

## 2025-01-24 NOTE — Telephone Encounter (Signed)
 Called patient and let him know that Dr. Anice put in a referral for [facility] to get an ABR test done. Patient stated he would call us  when he has it scheduled so he can get on Dr. Vergie scheduled for a follow up.

## 2025-02-19 ENCOUNTER — Inpatient Hospital Stay: Attending: Hematology and Oncology

## 2025-02-19 ENCOUNTER — Inpatient Hospital Stay: Admitting: Hematology and Oncology

## 2025-04-19 ENCOUNTER — Ambulatory Visit: Admitting: Nurse Practitioner

## 2025-04-26 ENCOUNTER — Ambulatory Visit: Admitting: Nurse Practitioner

## 2025-11-12 ENCOUNTER — Ambulatory Visit: Admitting: Nurse Practitioner
